# Patient Record
Sex: Male | Born: 1945
Health system: Southern US, Community
[De-identification: ages and names within clinical notes are randomized; demographics above are authoritative.]

## PROBLEM LIST (undated history)

## (undated) DIAGNOSIS — I6529 Occlusion and stenosis of unspecified carotid artery: Secondary | ICD-10-CM

## (undated) DIAGNOSIS — F17210 Nicotine dependence, cigarettes, uncomplicated: Secondary | ICD-10-CM

## (undated) DIAGNOSIS — E039 Hypothyroidism, unspecified: Secondary | ICD-10-CM

## (undated) DIAGNOSIS — Z9861 Coronary angioplasty status: Secondary | ICD-10-CM

## (undated) DIAGNOSIS — I739 Peripheral vascular disease, unspecified: Secondary | ICD-10-CM

## (undated) DIAGNOSIS — I2119 ST elevation (STEMI) myocardial infarction involving other coronary artery of inferior wall: Secondary | ICD-10-CM

## (undated) DIAGNOSIS — I2510 Atherosclerotic heart disease of native coronary artery without angina pectoris: Secondary | ICD-10-CM

## (undated) DIAGNOSIS — E785 Hyperlipidemia, unspecified: Secondary | ICD-10-CM

## (undated) DIAGNOSIS — R7989 Other specified abnormal findings of blood chemistry: Secondary | ICD-10-CM

## (undated) DIAGNOSIS — T82855A Stenosis of coronary artery stent, initial encounter: Secondary | ICD-10-CM

## (undated) DIAGNOSIS — I10 Essential (primary) hypertension: Secondary | ICD-10-CM

## (undated) HISTORY — DX: Hypothyroidism, unspecified: E03.9

## (undated) HISTORY — DX: Stenosis of coronary artery stent, initial encounter: T82.855A

## (undated) HISTORY — DX: Peripheral vascular disease, unspecified: I73.9

## (undated) HISTORY — PX: EYE SURGERY: SHX253

## (undated) HISTORY — PX: CHOLECYSTECTOMY: SHX55

## (undated) HISTORY — PX: CYST REMOVAL NECK: SHX6281

## (undated) HISTORY — DX: Atherosclerotic heart disease of native coronary artery without angina pectoris: Z98.61

## (undated) HISTORY — DX: ST elevation (STEMI) myocardial infarction involving other coronary artery of inferior wall: I21.19

## (undated) HISTORY — PX: TONSILLECTOMY: SUR1361

## (undated) HISTORY — DX: Essential (primary) hypertension: I10

## (undated) HISTORY — DX: Occlusion and stenosis of unspecified carotid artery: I65.29

## (undated) HISTORY — DX: Atherosclerotic heart disease of native coronary artery without angina pectoris: I25.10

## (undated) HISTORY — DX: Nicotine dependence, cigarettes, uncomplicated: F17.210

## (undated) HISTORY — PX: BACK SURGERY: SHX140

## (undated) HISTORY — DX: Other specified abnormal findings of blood chemistry: R79.89

## (undated) HISTORY — DX: Hyperlipidemia, unspecified: E78.5

---

## 1993-03-02 DIAGNOSIS — I2119 ST elevation (STEMI) myocardial infarction involving other coronary artery of inferior wall: Secondary | ICD-10-CM | POA: Insufficient documentation

## 1993-03-02 DIAGNOSIS — I219 Acute myocardial infarction, unspecified: Secondary | ICD-10-CM

## 1993-03-02 HISTORY — PX: CORONARY ANGIOPLASTY: SHX604

## 1993-03-02 HISTORY — DX: Acute myocardial infarction, unspecified: I21.9

## 1993-03-02 HISTORY — DX: ST elevation (STEMI) myocardial infarction involving other coronary artery of inferior wall: I21.19

## 2001-03-02 HISTORY — PX: CORONARY STENT PLACEMENT: SHX1402

## 2001-07-20 ENCOUNTER — Encounter: Admission: RE | Admit: 2001-07-20 | Discharge: 2001-07-20 | Payer: Self-pay | Admitting: Cardiology

## 2001-07-20 ENCOUNTER — Encounter: Payer: Self-pay | Admitting: Cardiology

## 2001-07-26 ENCOUNTER — Ambulatory Visit (HOSPITAL_COMMUNITY): Admission: RE | Admit: 2001-07-26 | Discharge: 2001-07-27 | Payer: Self-pay | Admitting: Cardiology

## 2001-07-27 ENCOUNTER — Encounter: Payer: Self-pay | Admitting: Cardiology

## 2001-10-07 ENCOUNTER — Ambulatory Visit (HOSPITAL_COMMUNITY): Admission: RE | Admit: 2001-10-07 | Discharge: 2001-10-08 | Payer: Self-pay | Admitting: Pediatrics

## 2003-02-20 ENCOUNTER — Encounter: Admission: RE | Admit: 2003-02-20 | Discharge: 2003-02-20 | Payer: Self-pay | Admitting: Cardiology

## 2008-12-03 ENCOUNTER — Encounter: Admission: RE | Admit: 2008-12-03 | Discharge: 2008-12-03 | Payer: Self-pay | Admitting: Neurosurgery

## 2010-03-02 DIAGNOSIS — T82855A Stenosis of coronary artery stent, initial encounter: Secondary | ICD-10-CM

## 2010-03-02 HISTORY — DX: Stenosis of coronary artery stent, initial encounter: T82.855A

## 2010-05-01 HISTORY — PX: CARDIAC CATHETERIZATION: SHX172

## 2010-05-14 ENCOUNTER — Emergency Department (HOSPITAL_COMMUNITY): Payer: 59

## 2010-05-14 ENCOUNTER — Inpatient Hospital Stay (HOSPITAL_COMMUNITY)
Admission: EM | Admit: 2010-05-14 | Discharge: 2010-05-15 | DRG: 287 | Disposition: A | Discharge status: Home / Self Care | Payer: 59 | Attending: Cardiovascular Disease | Admitting: Cardiovascular Disease

## 2010-05-14 DIAGNOSIS — E785 Hyperlipidemia, unspecified: Secondary | ICD-10-CM | POA: Diagnosis present

## 2010-05-14 DIAGNOSIS — I10 Essential (primary) hypertension: Secondary | ICD-10-CM | POA: Diagnosis present

## 2010-05-14 DIAGNOSIS — F17200 Nicotine dependence, unspecified, uncomplicated: Secondary | ICD-10-CM | POA: Diagnosis present

## 2010-05-14 DIAGNOSIS — Z7982 Long term (current) use of aspirin: Secondary | ICD-10-CM

## 2010-05-14 DIAGNOSIS — Z7902 Long term (current) use of antithrombotics/antiplatelets: Secondary | ICD-10-CM

## 2010-05-14 DIAGNOSIS — E039 Hypothyroidism, unspecified: Secondary | ICD-10-CM | POA: Diagnosis present

## 2010-05-14 DIAGNOSIS — R0789 Other chest pain: Principal | ICD-10-CM | POA: Diagnosis present

## 2010-05-14 LAB — COMPREHENSIVE METABOLIC PANEL
ALT: 28 U/L (ref 0–53)
AST: 26 U/L (ref 0–37)
Calcium: 9 mg/dL (ref 8.4–10.5)
GFR calc Af Amer: >60 mL/min (ref >60)
Potassium: 3.8 mEq/L (ref 3.5–5.1)
Sodium: 138 mEq/L (ref 135–145)
Total Protein: 6.6 g/dL (ref 6.0–8.3)

## 2010-05-14 LAB — CBC
HCT: 43.6 % (ref 39.0–52.0)
Hemoglobin: 15.3 g/dL (ref 13.0–17.0)
MCH: 33 pg (ref 26.0–34.0)
MCHC: 35.1 g/dL (ref 30.0–36.0)
RBC: 4.64 MIL/uL (ref 4.22–5.81)

## 2010-05-14 LAB — DIFFERENTIAL
Basophils Absolute: 0.1 K/uL (ref 0.0–0.1)
Basophils Relative: 1 % (ref 0–1)
Eosinophils Absolute: 0.2 K/uL (ref 0.0–0.7)
Eosinophils Relative: 3 % (ref 0–5)
Lymphocytes Relative: 27 % (ref 12–46)
Lymphs Abs: 2 K/uL (ref 0.7–4.0)
Monocytes Absolute: 0.6 K/uL (ref 0.1–1.0)
Monocytes Relative: 9 % (ref 3–12)
Neutro Abs: 4.3 K/uL (ref 1.7–7.7)
Neutrophils Relative %: 60 % (ref 43–77)

## 2010-05-14 LAB — POCT CARDIAC MARKERS
CKMB, poc: <1 ng/mL — ABNORMAL LOW (ref 1.0–8.0)
Myoglobin, poc: 55.8 ng/mL (ref 12–200)

## 2010-05-14 LAB — POCT I-STAT, CHEM 8
BUN: 14 mg/dL (ref 6–23)
Creatinine, Ser: 1.2 mg/dL (ref 0.4–1.5)
Glucose, Bld: 118 mg/dL — ABNORMAL HIGH (ref 70–99)
Sodium: 140 mEq/L (ref 135–145)
TCO2: 25 mmol/L (ref 0–100)

## 2010-05-14 LAB — PROTIME-INR
INR: 0.98 (ref 0.00–1.49)
Prothrombin Time: 13.2 seconds (ref 11.6–15.2)

## 2010-05-14 LAB — CK TOTAL AND CKMB (NOT AT ARMC)
CK, MB: 1.6 ng/mL (ref 0.3–4.0)
Relative Index: INVALID (ref 0.0–2.5)
Total CK: 84 U/L (ref 7–232)

## 2010-05-14 LAB — HEMOGLOBIN A1C: Hgb A1c MFr Bld: 5.9 % — ABNORMAL HIGH (ref <5.7)

## 2010-05-15 LAB — CARDIAC PANEL(CRET KIN+CKTOT+MB+TROPI)
CK, MB: 1.6 ng/mL (ref 0.3–4.0)
CK, MB: 1.4 ng/mL (ref 0.3–4.0)
Relative Index: INVALID (ref 0.0–2.5)
Total CK: 68 U/L (ref 7–232)

## 2010-05-15 LAB — CBC
HCT: 44.3 % (ref 39.0–52.0)
Hemoglobin: 15.3 g/dL (ref 13.0–17.0)
RBC: 4.68 MIL/uL (ref 4.22–5.81)
WBC: 5.5 10*3/uL (ref 4.0–10.5)

## 2010-05-15 LAB — LIPID PANEL
Cholesterol: 171 mg/dL (ref 0–200)
LDL Cholesterol: 90 mg/dL (ref 0–99)
VLDL: 49 mg/dL — ABNORMAL HIGH (ref 0–40)

## 2010-05-19 NOTE — Procedures (Signed)
  [name], [name] Dickerson                  ACCOUNT NO.:  [id number]  MEDICAL RECORD NO.:  [id number]           PATIENT TYPE:  I  LOCATION:  2035                         FACILITY:  [facility]  PHYSICIAN:  Thereasa Solo. Little, M.D. DATE OF BIRTH:  Sep 06, 1945  DATE OF PROCEDURE:  05/15/2010 DATE OF DISCHARGE:  05/15/2010                           CARDIAC CATHETERIZATION   INDICATIONS FOR TEST:  A 65 year old male was admitted with anginal-type pain.  He had had a RCA stent placed in 2003, had in-stent restenosis the same year, treated with a cutting balloon.  He has had very infrequent Cardiology followup since that time.  His EKG shows sinus bradycardia and cardiac markers x2 were negative.  After obtaining informed consent, the patient was prepped and draped in the usual sterile fashion, exposing the right groin.  Following local anesthetic with 1% Xylocaine, the Seldinger technique was employed and a 5-French introducer sheath was placed in the right femoral artery.  Left and right coronary arteriography and ventriculography in the RAO projection was performed.  COMPLICATIONS:  None.  EQUIPMENT:  Five-French Judkins configuration catheters.  TOTAL CONTRAST:  70 mL.  RESULTS:  Hemodynamic monitoring:  Central aortic pressure was 127/66, left ventricular pressure was 125/9.  Ventriculography:  Ventriculography in the RAO projection using 25 mL of contrast at 12 mL per second revealed the inferior wall to be akinetic. Ejection fraction 40%.  End-diastolic pressure was 17.  Coronary arteriography:  On fluoroscopy, there was calcification in the left main and proximal third of the LAD. 1. Left main.  There was terminal eccentric 40% narrowing of the left     main. 2. Circumflex.  The circumflex gave rise to one OM vessel, it was free     of disease. 3. Ramus widely patent, medium-sized vessel, free of disease. 4. LAD.  The proximal portion of the LAD was normal.  The distal 2/3rd     was  relatively small and there was a small diagonal branch. 5. Right coronary artery.  There was a stent in the midportion of the     RCA that was chronically occluded.  There was some slow filling of     the distal right via skipped collaterals around the stent and there     was an exceptionally large collateral bed from both the circumflex     and LAD, filling the RCA system.  The plan for now is medical therapy.  He will need a baseline nuclear study to follow the left main stenosis.  It is not critical enough at this point to warrant surgery.          ______________________________ Thereasa Solo Little, M.D.     ABL/MEDQ  D:  05/15/2010  T:  05/16/2010  Job:  [id number]  cc:   Burnell Blanks, MD Cath Lab  Electronically Signed by Julieanne Manson M.D. on 05/19/2010 04:26:04 PM

## 2010-06-01 NOTE — Discharge Summary (Signed)
[name], [name]                  ACCOUNT NO.:  [id number]  MEDICAL RECORD NO.:  [id number]           PATIENT TYPE:  I  LOCATION:  2035                         FACILITY:  [facility]  PHYSICIAN:  Thereasa Solo. Little, M.D. DATE OF BIRTH:  05/16/1945  DATE OF ADMISSION:  05/14/2010 DATE OF DISCHARGE:  05/15/2010                              DISCHARGE SUMMARY   DISCHARGE DIAGNOSES: 1. Chest pain, negative cardiac enzymes, negative EKG changes, status     post cardiac catheterization on May 15, 2010, showed chronic     occlusion of the right coronary artery with large left-to-right     collaterals.  Left main showed terminal 40% lesion. 2. Hypertension. 3. Dyslipidemia. 4. Tobacco abuse. 5. Hypothyroidism.  HOSPITAL COURSE:  [name] Dickerson is a 65 year old Caucasian male with history of coronary artery disease with a nondrug-eluting stent to the RCA in May 2003 with in-stent restenosis in August 2003, treated with cutting balloon.  History also includes hypertension, hyperlipidemia for which he is STATIN intolerant, erectile dysfunction, tobacco abuse, hypothyroidism.  He had a Myoview on September 06, 2009, which showed a marked perfusion defect in the basal mid and apical inferior as well as basal inferolateral and mid inferolateral region.  There was negative inducible ischemia with ejection fraction at that was 46%.  [name] Dickerson developed a burning sensation in his right thoracic spine which radiated up to his cervical spine as well as chest tightness.  He reported this as similar to his episode in 2003, at which time he had a myocardial infarction, however, this episode was less intense.  He was admitted to rule out acute coronary syndrome.  He was started on IV nitroglycerin, IV heparin, aspirin, Plavix, metoprolol 50 mg b.i.d., Synthroid at home dose.  TSH A1c and EKG were checked in the a.m.  He was discussed for left heart catheterization on May 15, 2010.  On the morning of May 15, 2010,  [name] Dickerson was pain free.  Cardiac enzymes to date have been negative x3.  Left heart catheterization showed a terminal 40% lesion in the left main.  There was total occlusion of the right coronary artery which appeared chronic with large left-to-right collaterals.  At this time, Dr. Darrick Penna feels [name] Dickerson is stable for discharge with follow up in the office and outpatient Myoview.  Also noted that the patient had tobacco cessation counseling and has been given a prescription for Nicotrol inhaler to facilitate cessation of smoking.  DISCHARGE LABORATORY DATA:  WBCs 5.5, hemoglobin 15.3, hematocrit 44.3, platelets 139,000.  Sodium 138, potassium 3.8, chloride 106, carbon dioxide 26, glucose 123, BUN 13, creatinine 1.02.  T-Bili 0.5, alkaline phosphatase 55, AST 26, ALT 28.  Total protein 6.6, albumin 4.0, calcium 9.0.  A1c 5.9, mean plasma glucose 123.  Cardiac enzymes negative x3. Cholesterol 171, triglycerides 245, HDL 32, LDL 90, VLDL was 49, and total cholesterol HDL ratio was 5.3.  TSH was 2.148.  STUDIES/PROCEDURES: 1. Chest x-ray on May 14, 2010, shows no active disease in one view.     Heart and mediastinal contours were normal.  Lungs were clear.  ICD     shunt was intact in one view. 2. Cardiac catheterization on May 15, 2010, left heart cath via the     right femoral artery with inferior akinesis with an ejection     fraction of 40%.  RCA showed occlusion which is chronic with large     left-to-right collaterals.  DISCHARGE MEDICATIONS: 1. Acetaminophen 325 mg 2 tablets by mouth every 4 hours as needed for     pain. 2. Nicotrol inhaler 10 mg per cartridge 1-6 inhalers inhaled daily. 3. Nitroglycerin sublingual 0.4 mg tablets, 1 tablet under the tongue     every 5 minutes up to 3 tablets total for chest pain. 4. Aspirin 81 mg 1 tablet by mouth daily. 5. Lisinopril 30 mg 1 tablet by mouth daily. 6. Lovaza 1 g capsules, 4 capsules by mouth daily. 7. Metoprolol tartrate 50  mg 1 tablet by mouth twice daily. 8. Niacin 500 mg 1 tablet by mouth twice daily. 9. Plavix 75 mg 1 tablet by mouth daily. 10.Synthroid 112 mcg 2 tablets by mouth every other day. 11.Synthroid 125 mcg 2 tablets on the days that was not taking the 112     mcg tablets. 12.Cinnamon herbal over-the-counter 1 capsule daily. 13.Coenzyme Q10 over-the-counter 1 tablet by mouth daily.  DISPOSITION:  [name] Dickerson will be discharged home in stable condition.  He is to increase his activity slowly.  He may shower or bathe.  He is recommended he eat a low-sodium, heart-healthy diet as well as increase his exercise level.  If catheter site becomes red, painful, swollen, or discharges fluid or pus, he is to call our office.  He will follow up on May 20, 2010, at 7 o'clock in the morning for a Persantine Myoview stress test as well as followup with Dr. Clarene Duke on May 26, 2010, at 11:20 a.m. for results of the stress test and also has been emphasized that [name] Dickerson needs to stop smoking and he has been given Nicotrol inhalers prescriptions in order to help facilitate that.    ______________________________ Wilburt Finlay, PA   ______________________________ Thereasa Solo. Little, M.D.    Jane Canary  D:  05/15/2010  T:  05/16/2010  Job:  [id number]  cc:   Burnell Blanks, MD  Electronically Signed by Wilburt Finlay PA on 05/29/2010 02:33:18 PM Electronically Signed by Julieanne Manson M.D. on 06/01/2010 10:27:03 AM

## 2010-07-18 NOTE — Cardiovascular Report (Signed)
[place]. [facility]  Patient:    [name] Dickerson, [name] Dickerson Visit Number: [id number] MRN: [id number]          Service Type: CAT Location: 6500 [id number] Attending Physician:  Loreli Dollar Dictated by:   Delrae Rend, M.D. Proc. Date: 07/26/01 Admit Date:  07/26/2001                          Cardiac Catheterization  NO DICTATION Dictated by:   Delrae Rend, M.D. Attending Physician:  Loreli Dollar DD:  07/26/01 TD:  07/27/01 Job: [id number]

## 2010-07-18 NOTE — Cardiovascular Report (Signed)
NAME:  [name] Dickerson, [name] Dickerson                            ACCOUNT NO.:  [id number]   MEDICAL RECORD NO.:  [id number]                   PATIENT TYPE:  OIB   LOCATION:  6531                                 FACILITY:  [facility]   PHYSICIAN:  Thereasa Solo. Little, M.D.              DATE OF BIRTH:  [birth year]   DATE OF PROCEDURE:  10/07/2001  DATE OF DISCHARGE:  10/08/2001                              CARDIAC CATHETERIZATION   INDICATIONS FOR PROCEDURE:  The patient is a 65 year old male who had stents  placed to his RCA, May 2003.  He presented with atypical rest pain  yesterday, but the pain was in the same distribution and similar to what he  had previously. Because of this, he is brought in for outpatient cardiac  catheterization.   DESCRIPTION OF PROCEDURE:  The patient was prepped and draped in the usual  sterile fashion exposing the right groin. Following local anesthetic with 1%  Xylocaine, a 5 French introducer sheath was placed into the right femoral  artery. Left and right coronary arteriography was performed.  Ventriculography was not performed.   EQUIPMENT:  The 5 French Judkins configuration catheters.   RESULTS:  Central aortic pressure 142/95.   CORONARY ARTERIOGRAPHY:  Calcification in the LAD, left main.  1. Left main:  Normal.  2. LAD:  The LAD had mild calcification in the proximal portion with 30%     area of narrowing proximally. Just distal to the first diagonal is a 50%     area of narrowing.  The first diagonal was not involved. The distal     vessel was free of disease as was the diagonal.  3. Circumflex:  The circumflex gave rise to a large obtuse marginal vessel     with proximal 30% area of narrowing.  4. Right coronary artery:  On fluoroscopy you could see the stents that were     in the proximal and midportion of the LAD. They clearly overlapped. Just     proximal to the stent was a 30% area of narrowing. In the stent were two     areas, on 50%, one 70% area of  narrowing. The distal vessel and the PDA     and PLs were free of disease.   Because of the stenosis in the stent, arrangements were made for  intervention.   A 6 French sheath was exchanged from the previously placed 5 [place] sheath.  A JR4 guide catheter, short luge wire, and a 4.0 x 15 Cutting Balloon was  used. The balloon was positioned within the stent and a total of seven  inflations were performed all within the stent. With each inflation, the  patient had chest pain similar to what he had at home, and ST segment  elevation on the monitors. This resolved with deflating the balloon.  The  areas that were 50 and 70 area preintervention were basically normal,  postintervention with brisk distal flow.  No dissection or thrombus. The  proximal area in the RCA was not addressed because it was not within the  stent and the lumen was greater than 3.0 mm.   Angiomax was used during the procedure. The patient was given 300 mg of  Plavix and should be ready for discharge in the morning.                                                Thereasa Solo. Little, M.D.    ABL/MEDQ  D:  10/07/2001  T:  10/11/2001  Job:  [id number]   cc:   Kizzie Furnish, M.D.

## 2010-07-18 NOTE — Cardiovascular Report (Signed)
[place]. [facility]  Patient:    [name] Dickerson, [name] Dickerson Visit Number: [id number] MRN: [id number]          Service Type: CAT Location: 6500 [id number] Attending Physician:  Loreli Dollar Dictated by:   Julieanne Manson, M.D. Proc. Date: 07/26/01 Admit Date:  07/26/2001 Discharge Date: 07/27/2001   CC:         Kizzie Furnish, M.D.  Catheterization Lab   Cardiac Catheterization  PROCEDURES: 1. Left heart catheterization. 2. Selective right and left coronary arteriography.  CARDIOLOGIST:  Julieanne Manson, M.D.  INDICATIONS FOR PROCEDURE:  Mr. Selley is a 65 year old male who in 1995 had an inferior myocardial infarction. He was treated with [facility] and angioplasty at that time.  He presents now with a two month history of progressive exertional angina worse in the last three weeks.  He is brought in for outpatient catheterization for unstable angina.  DESCRIPTION OF PROCEDURE:  The patient was prepared and draped in the usual sterile fashion exposing the right groin.  Following local anesthetic with 1% Xylocaine the Seldinger technique was employed, and a 5 [place] introducer sheath was placed in the right femoral artery.  Selective right and left coronary arteriography and ventriculography in the RAO projection was performed. The 5 French Judkins configuration catheters were used.  Following this, percutaneous intervention to the right coronary artery was undertaken.  COMPLICATIONS:  None.  RESULTS: 1. Hemodynamic monitoring:  Central aortic pressure 127/79, left    ventricular pressure 128/16, left ventricular end-diastolic pressure    was 23. 2. Ventriculography:  Ventriculography in the RAO projection using    25 cc of contrast at 12 cc per second revealed good opacification of the    left ventricular.  The posterior basilar segment, inferior and apical    segments were markedly hypokinetic.  The anterior wall showed normal    wall motion.  The ejection  fraction calculated at 43%.  No mitral    regurgitation was seen. End-diastolic pressure 23.  CORONARY ARTERIOGRAPHY:  There was calcification noted in the left main and proximal left anterior descending distribution on fluoroscopy. 1. Left main:  Normal. 2. Left anterior descending:  The LAD extended down and barely around the    apex of the heart.  It gave rise to one large first diagonal branch.    There was proximal irregularity of around 30% in the mid portion just after    the first diagonal branch and another area of 30% irregularity.  The    diagonal itself in the distal vessel is free of significant disease. 3. Optional diagonal:  Moderate size and normal. 4. Circumflex:  The circumflex gave rise to only one OM vessel.  This    system had 40% mild irregularities in the mid portion. 5. Right coronary artery:  The right coronary artery was a dominant vessel    and large in diameter.  The proximal and mid segments were severely    diseased.  The distal vessel had mild irregularities as did the PDA.    In the mid portion of the right coronary artery was a long area of 95%    narrowing.  The proximal portion of the vessel had two sequential lesions    of 70% narrowing.  There was some minimal narrowing of the ostium.  CONCLUSION: 1. Severe single vessel disease with high grade stenosis in the mid and    proximal right coronary artery. 2. Moderate left ventricular function dysfunction, 43% ejection fraction  inferior posterior basilar segment wall abnormalities. 3. Because of the above mentioned right coronary anatomy arrangements were    made for intervention.  A 7 French introducer sheath was exchanged for the    previously placed 5 [place] sheath.  Attempts at cannulating the right    coronary artery with a JR4 side hole 7 [place] was unsuccessful.  A    6 [place] guide catheter cannulated it nicely, and there was excellent    reflux.  I do not think that the 7 French catheter  damped or would not    engage because of ostial narrowing.  PROCEDURE:  A 182 cm luge wire was then passed into the PDA.  Initially, a CrossSail balloon 2.5 x 20 was placed in the area of the obstruction of the midportion. A single inflation of 10 atmospheres for 59 seconds resulted in marked improvement but still 60% narrowing.  There was no evidence of any focal dissection.  A 4-0 x 23 Zeta stent was made ready.  It was positioned distal to the mid lesion and extended back as far proximally as I could take it.  It was initially deployed at 13 atmospheres for 64 seconds with the final inflation being 14 atmospheres for 63 seconds.  This area that had been 95% previously now appeared to be normal after stenting.  There was brisk TIMI-3 flow pre and post intervention.  The proximal portion was then addressed.  A 4.0 x 15 Zeta stent was made ready and placed in such a manner that it overlapped the previously placed stent and extended proximally past both areas of narrowing.  It was deployed at 14 x 64 and 14 x 62.  This area appeared to be normal after intervention.  This same balloon was then placed so that it was well into the area of the overlap and a single inflation of 16 atmospheres for 60 seconds was performed.  The 70% areas of sequential narrowing now appeared to be normal.  The patient was given double bolus Integrelin, and this will be continued for 12 hours, 300 mg of oral Plavix, 5000 units of intravenous Hespan and he should be ready for discharge tomorrow.  Despite having a relatively good lipid panel he would need lipid lowering agents.    CONCLUSIONS: 1.  DISCUSSION: Dictated by:   Julieanne Manson, M.D. Attending Physician:  Loreli Dollar DD:  07/26/01 TD:  07/27/01 Job: [id number]

## 2010-07-18 NOTE — Consult Note (Signed)
[place]. [name]  Patient:    [name] Dickerson, [name] Dickerson Visit Number: [id number] MRN: [id number]          Service Type: CAT Location: 6500 [id number] 01 Attending Physician:  Loreli Dollar Dictated by:   Marlan Palau, M.D. Proc. Date: 07/27/01 Admit Date:  07/26/2001 Discharge Date: 07/27/2001   CC:         Julieanne Manson, M.D.  [facility]; [address]   Consultation Report  HISTORY OF PRESENT ILLNESS:  [name] Dickerson. Gosney is a 65 year old left-handed white male born 1946/01/12 with a history of coronary artery disease, hypertension.  This patient had evidence of a 95% stenosis of the mid portion of the right coronary artery and had a stent placed.  This patient has done well post procedure, but as he was "waking up" following the procedure noted double vision in the vertical plane with some horizontal separation as well. It seemed to be persistent and consistent no matter which direction he looked. Patient would note absence of double vision when he covered one eye.  This double vision has persisted until today, but is much improved.  Patient notes no other associated symptoms such as headache, nausea, vomiting, vertigo, numbness or weakness on the face, arms, or legs, gait instability, confusion, loss of vision.  This patient apparently had some possible slight left-sided ptosis following the coronary catheterization.  A carotid Doppler study is unremarkable.  Neurology has been asked to see this patient for further evaluation.  PAST MEDICAL HISTORY: 1. New onset of skewed deviation as above. 2. History of hypothyroidism. 3. History of coronary artery disease. 4. Hypertension. 5. Mild obesity.  MEDICATIONS PRIOR TO ADMISSION: 1. Synthroid 0.1 mg daily. 2. Toprol XL 50 mg q.d. 3. Enteric-coated aspirin 325 mg q.d. 4. Niacin 500 mg two b.i.d. for high cholesterol. 5. Patient will be discharged on Plavix.  SOCIAL  HISTORY:  Patient had been smoking two packs of cigarettes per day prior to this admission.  Drinks alcohol regularly, at least one or two drinks a night.  Patient is married, [place]  Has three children who are alive and well.  ALLERGIES:  Patient states no known allergies.  FAMILY HISTORY:  Both parents are still alive.  Mother has a history of cancer in the past.  Father is alive and well.  Patient has no brothers or sisters, is an only child.  No family history of diabetes is noted, although patient was told he had some elevation of blood sugar in the past that has been diet controlled.  REVIEW OF SYSTEMS:  Notable for no fevers, chills.  Patient denies shortness of breath, chest pains, nausea, vomiting, bowel or bladder disturbance, blackout episodes, vertigo, gait disturbance.  PHYSICAL EXAMINATION  VITAL SIGNS:  Blood pressure 142/86, heart rate 60, respiratory rate 20, temperature afebrile.  GENERAL:  This patient is a minimally to moderately obese white male who is alert and cooperative at the time of examination.  HEENT:  Head is atraumatic.  Eyes:  Pupils are equal, round, and reactive to light.  Disks are flat bilaterally.  No ptosis is seen.  NECK:  Supple.  No carotid bruits noted.  RESPIRATORY:  Clear.  CARDIOVASCULAR:  Regular rate and rhythm with no obvious murmurs or rubs noted.  EXTREMITIES:  Without significant edema.  NEUROLOGIC:  Cranial nerves as above.  Facial symmetry is present.  Patient notes good sensation to face to pin prick and  soft touch bilaterally.  Patient has good strength to fascial muscles and the muscles to head turn and shoulder shrug bilaterally.  Speech is well enunciated, not aphasic.  Some end gaze nystagmus is seen bilaterally.  With cover test there does appear to be some vertical misalignment.  The left eye is down and the right eye is up compared to the other eye.  Extraocular movements otherwise  appear to be fairly full. Patient has good strength in all four extremities.  Good symmetric motor tone is noted throughout.  Sensory testing is intact to pin prick, soft touch, vibratory sensation throughout.  Deep tendon reflexes are symmetric.  Toes are neutral bilaterally.  Patient was able to ambulate well without assistance. Has good tandem gait.  Romberg negative.  No pronator drift is seen.  Patient has good finger-nose-finger, heel-to-shin.  LABORATORY VALUES:  Notable for CPK 85.  White count 9.8, hemoglobin 15.3, hematocrit 43.3, platelets 177,000.  White count 6.5, hemoglobin 15.4, hematocrit 45.1, MCV 95.1, platelets 164,000.  INR 0.9.  Patient has high triglyceride level 406, cholesterol level 152.  IMPRESSION: 1. History of coronary artery disease status post coronary catheterization    stenting procedure. 2. 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 3. Thyroid disease. 4. Hypertension.  This patient likely sustained a small stroke event possibly from cholesterol emboli from the aorta during the coronary catheterization procedure.  Patient is improving significantly at this point.  Will pursue brief work-up at this point.  Patient cannot have an MRI scan of the brain due to recent coronary artery stenting procedure.  PLAN: 1. CT of the head.  If this is unremarkable patient may be discharged on    antiplatelet agents such as aspirin and Plavix. 2. Check blood work for TSH and acetylcholine receptor antibody level. 3. Will pursue an MRI of the brain and MR angiogram of the posterior    circulation at some point following discharge.  Need to wait at least three    weeks or so before this is done.  Patient is to contact our office    immediately if any further questions or problems arise. Dictated by:   Marlan Palau, M.D. Attending Physician:  Loreli Dollar DD:  07/27/01 TD:  07/28/01  Job:  [id number]

## 2011-04-08 DIAGNOSIS — I2510 Atherosclerotic heart disease of native coronary artery without angina pectoris: Secondary | ICD-10-CM | POA: Insufficient documentation

## 2011-04-08 HISTORY — DX: Atherosclerotic heart disease of native coronary artery without angina pectoris: I25.10

## 2011-10-01 HISTORY — PX: OTHER SURGICAL HISTORY: SHX169

## 2012-11-14 ENCOUNTER — Telehealth (HOSPITAL_COMMUNITY): Payer: Self-pay | Admitting: Cardiology

## 2012-11-16 ENCOUNTER — Other Ambulatory Visit (HOSPITAL_COMMUNITY): Payer: Self-pay | Admitting: Cardiology

## 2012-11-16 DIAGNOSIS — R0602 Shortness of breath: Secondary | ICD-10-CM

## 2013-01-02 ENCOUNTER — Ambulatory Visit (HOSPITAL_COMMUNITY): Payer: 59

## 2013-01-09 ENCOUNTER — Ambulatory Visit (HOSPITAL_COMMUNITY)
Admission: RE | Admit: 2013-01-09 | Discharge: 2013-01-09 | Disposition: A | Discharge status: Home / Self Care | Payer: Medicare Other | Source: Ambulatory Visit | Attending: Cardiovascular Disease | Admitting: Cardiovascular Disease

## 2013-01-09 DIAGNOSIS — R0602 Shortness of breath: Secondary | ICD-10-CM | POA: Insufficient documentation

## 2013-01-09 HISTORY — PX: TRANSTHORACIC ECHOCARDIOGRAM: SHX275

## 2013-01-09 NOTE — Progress Notes (Signed)
2D Echo Performed 01/09/2013    Aysen Shieh, RCS  

## 2013-01-10 ENCOUNTER — Telehealth: Payer: Self-pay | Admitting: *Deleted

## 2013-01-10 NOTE — Telephone Encounter (Signed)
Message copied by Tobin Chad on Tue Jan 10, 2013  5:55 PM ------      Message from: Herbie Baltimore, DAVID W      Created: Tue Jan 10, 2013  2:14 PM       EF almost back to normal with decreased motion in the region of the known blocked artery.      Otherwise no real abnormalities.            Marykay Lex, MD       ------

## 2013-01-10 NOTE — Telephone Encounter (Signed)
Left message to call back concerning echo

## 2013-01-11 NOTE — Telephone Encounter (Signed)
Spoke to patient. Result given . Verbalized understanding  

## 2013-01-11 NOTE — Telephone Encounter (Signed)
Returning your call. °

## 2013-03-10 ENCOUNTER — Ambulatory Visit (INDEPENDENT_AMBULATORY_CARE_PROVIDER_SITE_OTHER): Payer: Medicare Other | Admitting: Cardiology

## 2013-03-10 ENCOUNTER — Encounter: Payer: Self-pay | Admitting: Cardiology

## 2013-03-10 VITALS — BP 130/80 | HR 70 | Ht 69.0 in | Wt 204.1 lb

## 2013-03-10 DIAGNOSIS — R7989 Other specified abnormal findings of blood chemistry: Secondary | ICD-10-CM

## 2013-03-10 DIAGNOSIS — E785 Hyperlipidemia, unspecified: Secondary | ICD-10-CM

## 2013-03-10 DIAGNOSIS — F17210 Nicotine dependence, cigarettes, uncomplicated: Secondary | ICD-10-CM

## 2013-03-10 DIAGNOSIS — Z9861 Coronary angioplasty status: Secondary | ICD-10-CM

## 2013-03-10 DIAGNOSIS — E669 Obesity, unspecified: Secondary | ICD-10-CM

## 2013-03-10 DIAGNOSIS — F17200 Nicotine dependence, unspecified, uncomplicated: Secondary | ICD-10-CM

## 2013-03-10 DIAGNOSIS — I2119 ST elevation (STEMI) myocardial infarction involving other coronary artery of inferior wall: Secondary | ICD-10-CM

## 2013-03-10 DIAGNOSIS — E291 Testicular hypofunction: Secondary | ICD-10-CM

## 2013-03-10 DIAGNOSIS — I2510 Atherosclerotic heart disease of native coronary artery without angina pectoris: Secondary | ICD-10-CM

## 2013-03-10 DIAGNOSIS — I10 Essential (primary) hypertension: Secondary | ICD-10-CM

## 2013-03-10 NOTE — Patient Instructions (Signed)
Your physician discussed the importance of regular exercise and recommended that you start or continue a regular exercise program for good health.  Echo look better.   Your physician wants you to follow-up in 12 months Dr Ellyn Hack.  You will receive a reminder letter in the mail two months in advance. If you don't receive a letter, please call our office to schedule the follow-up appointment.

## 2013-03-10 NOTE — Progress Notes (Addendum)
PATIENT: [name] Dickerson MRN: [id number]  DOB: 1945-04-29   DOV:03/12/2013 PCP: Suzan Garibaldi, FNP  Clinic Note: Chief Complaint  Patient presents with  . Annual Exam    no complaints   HPI: [name] Dickerson is a 68 y.o.  male with a PMH below who presents today for annual followup. I last saw him in January 2014 he is doing well. He is a history of an inferior STEMI in 1995 followed by recurrent PCI to the RCA in 2003 is relatively stable since within Myoview was negative in August 2013. He is currently establishing a new primary provider named Suzan Garibaldi, [place] in [place], [place]. He had an echocardiogram done back in November for followup.  Interval History: He presents today doing relatively well and no major issues to complain of. His major thing he notes is just decreased energy and feeling tired overall. He is having mild episodic kidney stones and giving him some problems. He thinks his diet mostly because of the work occasionally has been quite difficult. He also has gained some weight but is not a more short of breath with exertion. He denies any chest tightness or pressure with exertion. No dyspnea position. No PND, orthopnea or edema. No lightheadedness, dizziness, wooziness or syncope/near syncope. No TIA or amaurosis fugax symptoms. No claudication symptoms. No melena, hematochezia or hematuria.  Past Medical History  Diagnosis Date  . ST elevation myocardial infarction (STEMI) of inferior wall, subsequent episode of care 1995    PTCA of RCA  . CAD S/P percutaneous coronary angioplasty 1995, 2003    Inferior STEMI-PTCA RCA; 2003: overlapping MultiLink Zeta BMS -RCA (4.0 mm x 23 mm, 4.0 mm x 15 mm); August 2003 Cutting Balloon PTCA of stents for ISR.;; Negative Myoview August 2013  . Hypertension   . Hyperlipidemia LDL goal <70     Statin intolerance. Monitored by PCP.  Marland Kitchen Moderate cigarette smoker (10-19 per day)     Was down to 5 cigarettes a day.  . Hypothyroidism   . Low testosterone      Prior Cardiac Evaluation and Past Surgical History: Past Surgical History  Procedure Laterality Date  . Coronary angioplasty  1995    Inferior STEMI-PTCA of RCA  . Coronary stent placement  2003    MultiLink Zeta BMS 4.0 mm x 23 mm, 4.0 mm x 15 mm --> Cutting Balloon PTCA for ISR several months later.  . Cardiac catheterization  March 2012    Mid occlusion of RCA with Right to Left and Left to Right collaterals; LAD, ramus and circumflex/OM widely patent  . Leane Call  August 2013    Moderate-severe sized INFERIOR perfusion defect with no evidence of ischemia. EF 55% with distal inferior HK. Large sized Inferior and Inferolateral Infarct with perhaps mild apical inferior ischemia.  . Transthoracic echocardiogram  01/09/2013    EF 50-55%; normal LV size and function. Probable mild HJ of the mid inferior and inferoseptal wall.; No valvular lesions.    Allergies  Allergen Reactions  . Statins     Soreness with Crestor    Current Outpatient Prescriptions  Medication Sig Dispense Refill  . aspirin EC 81 MG tablet Take 81 mg by mouth daily.      . Cinnamon 500 MG TABS Take 2,000 mg by mouth 2 (two) times daily.      . clopidogrel (PLAVIX) 75 MG tablet Take 75 mg by mouth daily with breakfast.      . lisinopril (PRINIVIL,ZESTRIL) 30 MG tablet  Take 30 mg by mouth daily.      . metoprolol (LOPRESSOR) 50 MG tablet Take 50 mg by mouth 2 (two) times daily.      . Niacin (NICOTINIC ACID ER PO) Take 500 mg by mouth daily.      . nitroGLYCERIN (NITROSTAT) 0.4 MG SL tablet Place 0.4 mg under the tongue every 5 (five) minutes as needed for chest pain.      Marland Kitchen testosterone cypionate (DEPO-TESTOSTERONE) 200 MG/ML injection Inject 200 mg into the muscle every 14 (fourteen) days.       No current facility-administered medications for this visit.    History   Social History Narrative   Married father of 3, grandfather 41. Works out occasionally on the treadmill. Coping to try get back into  some exercise now. Unfortunately back up to smoking one pack per day. Does not drink alcohol.      Date from from 35 or 6 cigarettes a day. He's tried electronic cigarettes and down without affect.   ROS: A comprehensive Review of Systems - Negative except Mild exertional dyspnea and fatigue as noted above. General ROS: positive for  - fatigue and That he thinks is likely due to a low testosterone levels. kidney stones  PHYSICAL EXAM BP 130/80  Pulse 70  Ht 5\' 9"  (1.753 m)  Wt 204 lb 1.6 oz (92.579 kg)  BMI 30.13 kg/m2 General appearance: alert& oriented x3, cooperative, appears stated age, no distress and mildly obese; well groomed/well-nourished Neck: no adenopathy, no carotid bruit, no JVD and supple, symmetrical, trachea midline Lungs: clear to auscultation bilaterally, normal percussion bilaterally and Nonlabored, good air movement Heart: regular rate and rhythm, S1, S2 normal, no murmur, click, rub or gallop and normal apical impulse Abdomen: soft, non-tender; bowel sounds normal; no masses,  no organomegaly and truncal obesity Extremities: extremities normal, atraumatic, no cyanosis or edema, no edema, redness or tenderness in the calves or thighs and no ulcers, gangrene or trophic changes Pulses: 2+ and symmetric Neurologic: Grossly normal  [id number] today: Yes Rate: 70 , Rhythm: NSR, nonspecific-ST-T abnormalities  Recent Labs:  None recently. Currently were checked by PCP 3 months ago.  ASSESSMENT / PLAN: CAD S/P percutaneous coronary angioplasty Relatively stable with no active anginal symptoms. He is RCA is noted the occluded in the left system had no significant disease. Mostly for her preventing this measures he is on aspirin plus Plavix. He is also on ACE inhibitor and beta blocker. Unfortunately he is statin intolerant and therefore none statin, but he is on niacin.  ST elevation myocardial infarction (STEMI) of inferior wall, subsequent episode of care No symptoms  to suggest inferior STEMI. His Myoview did show evidence of inferior scar with minimal ischemia.  Hypertension Well-controlled on beta blocker and ACE inhibitor on stable doses.  Hyperlipidemia LDL goal <70 Unfortunately statin intolerant. He is only on niacin. His labs will be rechecked by his PCP. He says they were last checked about 2-3 months ago, does not  Obesity (BMI 30-39.9) We talked about importance of him getting back into exercise. Of course this is somewhat bothered by the fact he does work long hours. His "very active at work but is not able to get into exercise. His also has chronic fatigue from low testosterone it makes him not want to do much exercise after work. I had a discussion with he and his wife about dietary modifications as well.  Moderate cigarette smoker (10-19 per day) Unfortunately he is back to smoking now.  I did spend about 34 minutes talking about the importance of smoking cessation. He's tried using i cigarettes, but not sure if he gave enough a chance. Also consider using the Nicoderm patches. He did not show dramatic interest in quitting, Micronase will be to I recommend patient will be to contact [place] [facility]uit[facility] for recommendations and assistance.  Low testosterone He still using need testosterone injections. We were asked and the concern for cancer in his*. My take on this is that he needs to have another baseline testosterone level to function at work. I would not target levels near therapeutic.  Instead I would simply sheet for subtherapeutic levels that would help with energy improvement.    Orders Placed This Encounter  Procedures  . EKG 12-Lead     Followup:  One year  Davaris Youtsey W. Ellyn Hack, M.D., M.S. [facility] [address] [place], [place]  [address]  [phone] Pager # [phone]

## 2013-03-12 ENCOUNTER — Encounter: Payer: Self-pay | Admitting: Cardiology

## 2013-03-12 DIAGNOSIS — F17210 Nicotine dependence, cigarettes, uncomplicated: Secondary | ICD-10-CM | POA: Insufficient documentation

## 2013-03-12 DIAGNOSIS — E785 Hyperlipidemia, unspecified: Secondary | ICD-10-CM | POA: Insufficient documentation

## 2013-03-12 DIAGNOSIS — E669 Obesity, unspecified: Secondary | ICD-10-CM

## 2013-03-12 DIAGNOSIS — I10 Essential (primary) hypertension: Secondary | ICD-10-CM | POA: Insufficient documentation

## 2013-03-12 DIAGNOSIS — I2119 ST elevation (STEMI) myocardial infarction involving other coronary artery of inferior wall: Secondary | ICD-10-CM | POA: Insufficient documentation

## 2013-03-12 DIAGNOSIS — R7989 Other specified abnormal findings of blood chemistry: Secondary | ICD-10-CM | POA: Insufficient documentation

## 2013-03-12 HISTORY — DX: Obesity, unspecified: E66.9

## 2013-03-12 NOTE — Assessment & Plan Note (Signed)
No symptoms to suggest inferior STEMI. His Myoview did show evidence of inferior scar with minimal ischemia.

## 2013-03-12 NOTE — Assessment & Plan Note (Signed)
Well-controlled on beta blocker and ACE inhibitor on stable doses.

## 2013-03-12 NOTE — Assessment & Plan Note (Signed)
Unfortunately statin intolerant. He is only on niacin. His labs will be rechecked by his PCP. He says they were last checked about 2-3 months ago, does not

## 2013-03-12 NOTE — Assessment & Plan Note (Addendum)
We talked about importance of him getting back into exercise. Of course this is somewhat bothered by the fact he does work long hours. His "very active at work but is not able to get into exercise. His also has chronic fatigue from low testosterone it makes him not want to do much exercise after work. I had a discussion with he and his wife about dietary modifications as well.

## 2013-03-12 NOTE — Assessment & Plan Note (Signed)
He still using need testosterone injections. We were asked and the concern for cancer in his*. My take on this is that he needs to have another baseline testosterone level to function at work. I would not target levels near therapeutic.  Instead I would simply sheet for subtherapeutic levels that would help with energy improvement.

## 2013-03-12 NOTE — Assessment & Plan Note (Signed)
Unfortunately he is back to smoking now. I did spend about 34 minutes talking about the importance of smoking cessation. He's tried using i cigarettes, but not sure if he gave enough a chance. Also consider using the Nicoderm patches. He did not show dramatic interest in quitting, Micronase will be to I recommend patient will be to contact [place] [facility]uit-[facility] for recommendations and assistance.

## 2013-03-12 NOTE — Assessment & Plan Note (Signed)
Relatively stable with no active anginal symptoms. He is RCA is noted the occluded in the left system had no significant disease. Mostly for her preventing this measures he is on aspirin plus Plavix. He is also on ACE inhibitor and beta blocker. Unfortunately he is statin intolerant and therefore none statin, but he is on niacin.

## 2013-03-13 ENCOUNTER — Encounter: Payer: Self-pay | Admitting: Cardiology

## 2013-05-15 ENCOUNTER — Other Ambulatory Visit: Payer: Self-pay | Admitting: *Deleted

## 2013-05-15 MED ORDER — NITROGLYCERIN 0.4 MG SL SUBL: 0.4000 mg | SUBLINGUAL_TABLET | SUBLINGUAL | Status: DC | PRN

## 2013-05-15 NOTE — Telephone Encounter (Signed)
Rx was sent to pharmacy electronically. 

## 2014-03-19 DIAGNOSIS — M5417 Radiculopathy, lumbosacral region: Secondary | ICD-10-CM | POA: Diagnosis not present

## 2014-03-19 DIAGNOSIS — J9801 Acute bronchospasm: Secondary | ICD-10-CM | POA: Diagnosis not present

## 2014-03-19 DIAGNOSIS — M9903 Segmental and somatic dysfunction of lumbar region: Secondary | ICD-10-CM | POA: Diagnosis not present

## 2014-03-19 DIAGNOSIS — J209 Acute bronchitis, unspecified: Secondary | ICD-10-CM | POA: Diagnosis not present

## 2014-03-19 DIAGNOSIS — M9905 Segmental and somatic dysfunction of pelvic region: Secondary | ICD-10-CM | POA: Diagnosis not present

## 2014-03-19 DIAGNOSIS — M9902 Segmental and somatic dysfunction of thoracic region: Secondary | ICD-10-CM | POA: Diagnosis not present

## 2014-03-19 DIAGNOSIS — M5441 Lumbago with sciatica, right side: Secondary | ICD-10-CM | POA: Diagnosis not present

## 2014-03-19 DIAGNOSIS — M546 Pain in thoracic spine: Secondary | ICD-10-CM | POA: Diagnosis not present

## 2014-03-21 ENCOUNTER — Ambulatory Visit (INDEPENDENT_AMBULATORY_CARE_PROVIDER_SITE_OTHER): Payer: Medicare Other | Admitting: Cardiology

## 2014-03-21 VITALS — BP 110/62 | HR 71 | Ht 69.0 in | Wt 203.5 lb

## 2014-03-21 DIAGNOSIS — E669 Obesity, unspecified: Secondary | ICD-10-CM | POA: Diagnosis not present

## 2014-03-21 DIAGNOSIS — I10 Essential (primary) hypertension: Secondary | ICD-10-CM | POA: Diagnosis not present

## 2014-03-21 DIAGNOSIS — I2510 Atherosclerotic heart disease of native coronary artery without angina pectoris: Secondary | ICD-10-CM | POA: Diagnosis not present

## 2014-03-21 DIAGNOSIS — F17210 Nicotine dependence, cigarettes, uncomplicated: Secondary | ICD-10-CM

## 2014-03-21 DIAGNOSIS — Z9861 Coronary angioplasty status: Secondary | ICD-10-CM | POA: Diagnosis not present

## 2014-03-21 DIAGNOSIS — I2119 ST elevation (STEMI) myocardial infarction involving other coronary artery of inferior wall: Secondary | ICD-10-CM

## 2014-03-21 DIAGNOSIS — E785 Hyperlipidemia, unspecified: Secondary | ICD-10-CM | POA: Diagnosis not present

## 2014-03-21 NOTE — Patient Instructions (Signed)
STOP ASPIRIN  STOP SMOKING ----Your physician discussed the hazards of tobacco use. Tobacco use cessation is recommended and techniques and options to help you quit were discussed.     Your physician wants you to follow-up in [place].  You will receive a reminder letter in the mail two months in advance. If you don't receive a letter, please call our office to schedule the follow-up appointment.

## 2014-03-23 ENCOUNTER — Encounter: Payer: Self-pay | Admitting: Cardiology

## 2014-03-23 NOTE — Assessment & Plan Note (Signed)
No active anginal symptoms. He has been on aspirin plus Plavix. I will stop aspirin since he no longer has stents to be concerned about. He is on ACE inhibitor, beta blocker and theoretically on statin - but he indicates he has not been taking it routinely.[name] Dickerson

## 2014-03-23 NOTE — Progress Notes (Signed)
PATIENT: [name] Dickerson MRN: [id number]  DOB: 21-Dec-1945   DOV:03/23/2014 PCP: Suzan Garibaldi, [place], [place].  Clinic Note: Chief Complaint  Patient presents with  . Annual Exam    pt denied chest pain and SOB  . Coronary Artery Disease    History of inferior STEMI - now with occluded RCA   HPI: [name] Dickerson is a 68 y.o.  male with a coronary artery disease history as described below presents for annual followup visit.  Inferior STEMI in 1995 -- PTCA of RCA  04/2001: Recurrent angina - overlapping BMS x 2 mRCA  09/2001: Cutting Balloon PTCA for ISR  2012: 100% mRCA occlusion, R-R bridging & L-R collaterals  2013: Myoview with Large Inferior "Infarct" w/ mild per-infarct ischemia  12/2012: Echo: EF 50-55%, distal Inferior HK  Interval History: He presents today doing relatively well and no major complaints.  He still has a little fatigue and lack of ago. He related more to feeling lazy. He otherwise has not been taking Crestor routinely, because he thinks it may have something to do with it. No recent kidney stone issues. He is to stay active but is not doing routine exercise. He is working a lot that limits his time. His weight remains stable which was an issue before. He has not noted as much exertional dyspnea as it had before.  He denies any chest tightness or pressure with exertion. No dyspnea position. No PND, orthopnea or edema. No lightheadedness, dizziness, wooziness or syncope/near syncope. No TIA or amaurosis fugax symptoms. No claudication symptoms. No melena, hematochezia or hematuria.  Past Medical History  Diagnosis Date  . ST elevation myocardial infarction (STEMI) of inferior wall, subsequent episode of care 1995    PTCA of RCA; large inferior scar noted on Myoview. Mid inferior and inferoseptal hypokinesis on echocardiogram November 2014. EF 50-55%.  Marland Kitchen CAD S/P percutaneous coronary angioplasty 1995, 2003    Inferior STEMI-PTCA RCA; 2003: Overlapping MultiLink Zeta  BMS -RCA (4.0 x 23, 4.0 x 15); 09/2001 Cutting Balloon PTCA of stents for ISR.;; Myoview August 2013 - Mod-severe sized fixed INFERIOR perfusion defect => likely infarct with mild peri-infarct ischemia in the apical inferior wall, EF 55% with distal inferior hypokinesis  . Stenosis of coronary stent 2012    Mid RCA Occlusion with Right-To-Left and Left-To-Right Collaterals.; Relatively normal LAD, ramus intermedius and circumflex-none.  . Hyperlipidemia LDL goal <70   . Essential hypertension   . Hypothyroidism   . Low testosterone   . Moderate cigarette smoker (10-19 per day)     Was down to 5 cigarettes a day.   Allergies  Allergen Reactions  . Statins     Soreness with Crestor    Current Outpatient Prescriptions  Medication Sig Dispense Refill  . Cinnamon 500 MG TABS Take 2,000 mg by mouth 2 (two) times daily.    . clopidogrel (PLAVIX) 75 MG tablet Take 75 mg by mouth daily with breakfast.    . levothyroxine (SYNTHROID, LEVOTHROID) 175 MCG tablet Take 1 tablet by mouth daily.    Marland Kitchen lisinopril (PRINIVIL,ZESTRIL) 30 MG tablet Take 30 mg by mouth daily.    . metoprolol (LOPRESSOR) 50 MG tablet Take 50 mg by mouth 2 (two) times daily.    . Niacin (NICOTINIC ACID ER PO) Take 500 mg by mouth daily.    . nitroGLYCERIN (NITROSTAT) 0.4 MG SL tablet Place 1 tablet (0.4 mg total) under the tongue every 5 (five) minutes as needed for chest pain. 25  tablet 3  . predniSONE (DELTASONE) 10 MG tablet     . testosterone cypionate (DEPO-TESTOSTERONE) 200 MG/ML injection Inject 200 mg into the muscle every 14 (fourteen) days.     No current facility-administered medications for this visit.    History   Social History Narrative   Married father of 3, grandfather 25. Works out occasionally on the treadmill. Coping to try get back into some exercise now. Unfortunately back up to smoking one pack per day. Does not drink alcohol.      Date from from 59 or 6 cigarettes a day. He's tried electronic  cigarettes and down without affect.   ROS: A comprehensive Review of Systems -  Was performed Review of Systems  Constitutional: Positive for malaise/fatigue (Overall stable to improved.).  HENT: Negative for nosebleeds.   Respiratory: Positive for cough (Mild daily cough) and shortness of breath (only with notable exertion). Negative for sputum production and wheezing.   Cardiovascular: Negative for claudication.  Gastrointestinal: Negative for blood in stool and melena.  Genitourinary: Positive for flank pain (Occasional flank pain and mild dysuria/hematuria when he has nephrolithiasis. -- None recently). Negative for hematuria.  Musculoskeletal: Positive for joint pain.  Neurological: Negative for focal weakness.  Endo/Heme/Allergies: Bruises/bleeds easily.  All other systems reviewed and are negative.  PHYSICAL EXAM BP 110/62 mmHg  Pulse 71  Ht 5\' 9"  (1.753 m)  Wt 203 lb 8 oz (92.307 kg)  BMI 30.04 kg/m2 General appearance: alert& oriented x3, cooperative, appears stated age, no distress and mildly obese; well groomed/well-nourished Neck: no adenopathy, no carotid bruit, no JVD and supple, symmetrical, trachea midline Lungs: clear to auscultation bilaterally, normal percussion bilaterally and Nonlabored, good air movement Heart: regular rate and rhythm, S1, S2 normal, no murmur, click, rub or gallop and normal apical impulse Abdomen: soft, non-tender; bowel sounds normal; no masses,  no organomegaly and truncal obesity Extremities: extremities normal, atraumatic, no cyanosis or edema, no edema, redness or tenderness in the calves or thighs and no ulcers, gangrene or trophic changes Pulses: 2+ and symmetric Neurologic: Grossly normal  [id number] today: Yes Rate: 1 , Rhythm: NSR, nonspecific-ST-T abnormalities; Borderline criteria for MI, age undetermined  Recent Labs:  He said he had labs recently checked by PCP.Marland Kitchen  ASSESSMENT / PLAN: ST elevation myocardial infarction  (STEMI) of inferior wall, subsequent episode of care Back in 1990 history PTCA. He then had several stents placed to the RCA which probably were occluded. There is a notable scar on Myoview and hypokinesis on echocardiogram. Thankfully his EF has remained stable and he has not had any anginal or heart pain or symptoms. Provided his left coronary system remains patent, he should be fine.   CAD S/P percutaneous coronary angioplasty No active anginal symptoms. He has been on aspirin plus Plavix. I will stop aspirin since he no longer has stents to be concerned about. He is on ACE inhibitor, beta blocker and theoretically on statin - but he indicates he has not been taking it routinely..   Hyperlipidemia LDL goal <70 Apparently has not been taking Crestor regularly. He is on niacin. I would like to see what his labs are from his last PCP check. Erasmo Downer to try taking Crestor to don't think this was making him feel "lazy."   Essential hypertension Well controlled on stable doses of ACE inhibitor and beta blocker   Moderate cigarette smoker (10-19 per day) I admonished him to stop smoking. He was doing very well, and now is back to one half  to a full pack a day.  Smoking cessation instruction/counseling given:  counseled patient on the dangers of tobacco use, advised patient to stop smoking, and reviewed strategies to maximize success ; 4-5 min.   Obesity (BMI 30-39.9) Database his weight is currently stable. The patient understands the need to lose weight with diet and exercise. We have discussed specific strategies for this.      Orders Placed This Encounter  Procedures  . EKG 12-Lead     Followup:  One year  Leonie Man, M.D., M.S. Interventional Cardiologist   Pager # [phone]

## 2014-03-23 NOTE — Assessment & Plan Note (Signed)
Well controlled on stable doses of ACE inhibitor and beta blocker

## 2014-03-23 NOTE — Assessment & Plan Note (Signed)
I admonished him to stop smoking. He was doing very well, and now is back to one half to a full pack a day.  Smoking cessation instruction/counseling given:  counseled patient on the dangers of tobacco use, advised patient to stop smoking, and reviewed strategies to maximize success ; 4-5 min.

## 2014-03-23 NOTE — Assessment & Plan Note (Signed)
Apparently has not been taking Crestor regularly. He is on niacin. I would like to see what his labs are from his last PCP check. [name] Dickerson Downer to try taking Crestor to don't think this was making him feel "lazy."

## 2014-03-23 NOTE — Assessment & Plan Note (Signed)
Back in 1990 history PTCA. He then had several stents placed to the RCA which probably were occluded. There is a notable scar on Myoview and hypokinesis on echocardiogram. Thankfully his EF has remained stable and he has not had any anginal or heart pain or symptoms. Provided his left coronary system remains patent, he should be fine.

## 2014-03-23 NOTE — Assessment & Plan Note (Signed)
Database his weight is currently stable. The patient understands the need to lose weight with diet and exercise. We have discussed specific strategies for this.

## 2014-04-03 DIAGNOSIS — H6690 Otitis media, unspecified, unspecified ear: Secondary | ICD-10-CM | POA: Diagnosis not present

## 2014-04-03 DIAGNOSIS — J020 Streptococcal pharyngitis: Secondary | ICD-10-CM | POA: Diagnosis not present

## 2014-04-05 DIAGNOSIS — M9903 Segmental and somatic dysfunction of lumbar region: Secondary | ICD-10-CM | POA: Diagnosis not present

## 2014-04-05 DIAGNOSIS — M9902 Segmental and somatic dysfunction of thoracic region: Secondary | ICD-10-CM | POA: Diagnosis not present

## 2014-04-05 DIAGNOSIS — M5431 Sciatica, right side: Secondary | ICD-10-CM | POA: Diagnosis not present

## 2014-04-05 DIAGNOSIS — M9905 Segmental and somatic dysfunction of pelvic region: Secondary | ICD-10-CM | POA: Diagnosis not present

## 2014-04-05 DIAGNOSIS — M5432 Sciatica, left side: Secondary | ICD-10-CM | POA: Diagnosis not present

## 2014-04-11 DIAGNOSIS — M9902 Segmental and somatic dysfunction of thoracic region: Secondary | ICD-10-CM | POA: Diagnosis not present

## 2014-04-11 DIAGNOSIS — M9905 Segmental and somatic dysfunction of pelvic region: Secondary | ICD-10-CM | POA: Diagnosis not present

## 2014-04-11 DIAGNOSIS — M9903 Segmental and somatic dysfunction of lumbar region: Secondary | ICD-10-CM | POA: Diagnosis not present

## 2014-04-11 DIAGNOSIS — M5432 Sciatica, left side: Secondary | ICD-10-CM | POA: Diagnosis not present

## 2014-04-11 DIAGNOSIS — M5431 Sciatica, right side: Secondary | ICD-10-CM | POA: Diagnosis not present

## 2014-04-26 DIAGNOSIS — M9903 Segmental and somatic dysfunction of lumbar region: Secondary | ICD-10-CM | POA: Diagnosis not present

## 2014-04-26 DIAGNOSIS — M9902 Segmental and somatic dysfunction of thoracic region: Secondary | ICD-10-CM | POA: Diagnosis not present

## 2014-04-26 DIAGNOSIS — M9905 Segmental and somatic dysfunction of pelvic region: Secondary | ICD-10-CM | POA: Diagnosis not present

## 2014-04-26 DIAGNOSIS — M5431 Sciatica, right side: Secondary | ICD-10-CM | POA: Diagnosis not present

## 2014-05-10 DIAGNOSIS — S0500XA Injury of conjunctiva and corneal abrasion without foreign body, unspecified eye, initial encounter: Secondary | ICD-10-CM | POA: Diagnosis not present

## 2014-05-10 DIAGNOSIS — H5201 Hypermetropia, right eye: Secondary | ICD-10-CM | POA: Diagnosis not present

## 2014-05-15 DIAGNOSIS — M9905 Segmental and somatic dysfunction of pelvic region: Secondary | ICD-10-CM | POA: Diagnosis not present

## 2014-05-15 DIAGNOSIS — M9903 Segmental and somatic dysfunction of lumbar region: Secondary | ICD-10-CM | POA: Diagnosis not present

## 2014-05-15 DIAGNOSIS — M9902 Segmental and somatic dysfunction of thoracic region: Secondary | ICD-10-CM | POA: Diagnosis not present

## 2014-05-15 DIAGNOSIS — M4307 Spondylolysis, lumbosacral region: Secondary | ICD-10-CM | POA: Diagnosis not present

## 2014-05-16 DIAGNOSIS — E119 Type 2 diabetes mellitus without complications: Secondary | ICD-10-CM | POA: Diagnosis not present

## 2014-05-16 DIAGNOSIS — I10 Essential (primary) hypertension: Secondary | ICD-10-CM | POA: Diagnosis not present

## 2014-05-16 DIAGNOSIS — E039 Hypothyroidism, unspecified: Secondary | ICD-10-CM | POA: Diagnosis not present

## 2014-05-16 DIAGNOSIS — M109 Gout, unspecified: Secondary | ICD-10-CM | POA: Diagnosis not present

## 2014-05-24 DIAGNOSIS — H25812 Combined forms of age-related cataract, left eye: Secondary | ICD-10-CM | POA: Diagnosis not present

## 2014-05-24 DIAGNOSIS — H2512 Age-related nuclear cataract, left eye: Secondary | ICD-10-CM | POA: Diagnosis not present

## 2014-06-05 DIAGNOSIS — M9905 Segmental and somatic dysfunction of pelvic region: Secondary | ICD-10-CM | POA: Diagnosis not present

## 2014-06-05 DIAGNOSIS — M4307 Spondylolysis, lumbosacral region: Secondary | ICD-10-CM | POA: Diagnosis not present

## 2014-06-05 DIAGNOSIS — M9902 Segmental and somatic dysfunction of thoracic region: Secondary | ICD-10-CM | POA: Diagnosis not present

## 2014-06-05 DIAGNOSIS — M9903 Segmental and somatic dysfunction of lumbar region: Secondary | ICD-10-CM | POA: Diagnosis not present

## 2014-06-18 DIAGNOSIS — H25811 Combined forms of age-related cataract, right eye: Secondary | ICD-10-CM | POA: Diagnosis not present

## 2014-06-18 DIAGNOSIS — H2511 Age-related nuclear cataract, right eye: Secondary | ICD-10-CM | POA: Diagnosis not present

## 2014-07-09 DIAGNOSIS — R52 Pain, unspecified: Secondary | ICD-10-CM | POA: Diagnosis not present

## 2014-07-09 DIAGNOSIS — J329 Chronic sinusitis, unspecified: Secondary | ICD-10-CM | POA: Diagnosis not present

## 2014-07-13 DIAGNOSIS — T798XXA Other early complications of trauma, initial encounter: Secondary | ICD-10-CM | POA: Diagnosis not present

## 2014-07-13 DIAGNOSIS — T1490 Injury, unspecified: Secondary | ICD-10-CM | POA: Diagnosis not present

## 2014-07-13 DIAGNOSIS — L03114 Cellulitis of left upper limb: Secondary | ICD-10-CM | POA: Diagnosis not present

## 2014-07-17 DIAGNOSIS — M9901 Segmental and somatic dysfunction of cervical region: Secondary | ICD-10-CM | POA: Diagnosis not present

## 2014-07-17 DIAGNOSIS — M9902 Segmental and somatic dysfunction of thoracic region: Secondary | ICD-10-CM | POA: Diagnosis not present

## 2014-07-17 DIAGNOSIS — M5413 Radiculopathy, cervicothoracic region: Secondary | ICD-10-CM | POA: Diagnosis not present

## 2014-07-17 DIAGNOSIS — M545 Low back pain: Secondary | ICD-10-CM | POA: Diagnosis not present

## 2014-07-17 DIAGNOSIS — M546 Pain in thoracic spine: Secondary | ICD-10-CM | POA: Diagnosis not present

## 2014-07-17 DIAGNOSIS — M9903 Segmental and somatic dysfunction of lumbar region: Secondary | ICD-10-CM | POA: Diagnosis not present

## 2014-07-23 DIAGNOSIS — M9902 Segmental and somatic dysfunction of thoracic region: Secondary | ICD-10-CM | POA: Diagnosis not present

## 2014-07-23 DIAGNOSIS — M9903 Segmental and somatic dysfunction of lumbar region: Secondary | ICD-10-CM | POA: Diagnosis not present

## 2014-07-23 DIAGNOSIS — M9901 Segmental and somatic dysfunction of cervical region: Secondary | ICD-10-CM | POA: Diagnosis not present

## 2014-07-23 DIAGNOSIS — M546 Pain in thoracic spine: Secondary | ICD-10-CM | POA: Diagnosis not present

## 2014-07-23 DIAGNOSIS — M545 Low back pain: Secondary | ICD-10-CM | POA: Diagnosis not present

## 2014-07-23 DIAGNOSIS — M5413 Radiculopathy, cervicothoracic region: Secondary | ICD-10-CM | POA: Diagnosis not present

## 2014-07-25 DIAGNOSIS — J019 Acute sinusitis, unspecified: Secondary | ICD-10-CM | POA: Diagnosis not present

## 2014-07-25 DIAGNOSIS — T1490 Injury, unspecified: Secondary | ICD-10-CM | POA: Diagnosis not present

## 2014-07-25 DIAGNOSIS — A4902 Methicillin resistant Staphylococcus aureus infection, unspecified site: Secondary | ICD-10-CM | POA: Diagnosis not present

## 2014-08-10 DIAGNOSIS — D510 Vitamin B12 deficiency anemia due to intrinsic factor deficiency: Secondary | ICD-10-CM | POA: Diagnosis not present

## 2014-08-21 DIAGNOSIS — M9904 Segmental and somatic dysfunction of sacral region: Secondary | ICD-10-CM | POA: Diagnosis not present

## 2014-08-21 DIAGNOSIS — M542 Cervicalgia: Secondary | ICD-10-CM | POA: Diagnosis not present

## 2014-08-21 DIAGNOSIS — M4307 Spondylolysis, lumbosacral region: Secondary | ICD-10-CM | POA: Diagnosis not present

## 2014-08-21 DIAGNOSIS — M9903 Segmental and somatic dysfunction of lumbar region: Secondary | ICD-10-CM | POA: Diagnosis not present

## 2014-08-21 DIAGNOSIS — M9901 Segmental and somatic dysfunction of cervical region: Secondary | ICD-10-CM | POA: Diagnosis not present

## 2014-08-28 DIAGNOSIS — G4700 Insomnia, unspecified: Secondary | ICD-10-CM | POA: Diagnosis not present

## 2014-08-28 DIAGNOSIS — F4321 Adjustment disorder with depressed mood: Secondary | ICD-10-CM | POA: Diagnosis not present

## 2014-08-28 DIAGNOSIS — Z716 Tobacco abuse counseling: Secondary | ICD-10-CM | POA: Diagnosis not present

## 2014-08-28 DIAGNOSIS — F519 Sleep disorder not due to a substance or known physiological condition, unspecified: Secondary | ICD-10-CM | POA: Diagnosis not present

## 2014-08-28 DIAGNOSIS — R7989 Other specified abnormal findings of blood chemistry: Secondary | ICD-10-CM | POA: Diagnosis not present

## 2014-08-28 DIAGNOSIS — R7981 Abnormal blood-gas level: Secondary | ICD-10-CM | POA: Diagnosis not present

## 2014-08-28 DIAGNOSIS — E039 Hypothyroidism, unspecified: Secondary | ICD-10-CM | POA: Diagnosis not present

## 2014-08-28 DIAGNOSIS — F17200 Nicotine dependence, unspecified, uncomplicated: Secondary | ICD-10-CM | POA: Diagnosis not present

## 2014-08-28 DIAGNOSIS — I10 Essential (primary) hypertension: Secondary | ICD-10-CM | POA: Diagnosis not present

## 2014-09-04 DIAGNOSIS — M9904 Segmental and somatic dysfunction of sacral region: Secondary | ICD-10-CM | POA: Diagnosis not present

## 2014-09-04 DIAGNOSIS — M4306 Spondylolysis, lumbar region: Secondary | ICD-10-CM | POA: Diagnosis not present

## 2014-09-04 DIAGNOSIS — M47817 Spondylosis without myelopathy or radiculopathy, lumbosacral region: Secondary | ICD-10-CM | POA: Diagnosis not present

## 2014-09-04 DIAGNOSIS — M9903 Segmental and somatic dysfunction of lumbar region: Secondary | ICD-10-CM | POA: Diagnosis not present

## 2014-09-04 DIAGNOSIS — M4307 Spondylolysis, lumbosacral region: Secondary | ICD-10-CM | POA: Diagnosis not present

## 2014-09-04 DIAGNOSIS — M9905 Segmental and somatic dysfunction of pelvic region: Secondary | ICD-10-CM | POA: Diagnosis not present

## 2014-09-05 DIAGNOSIS — M4307 Spondylolysis, lumbosacral region: Secondary | ICD-10-CM | POA: Diagnosis not present

## 2014-09-05 DIAGNOSIS — M4306 Spondylolysis, lumbar region: Secondary | ICD-10-CM | POA: Diagnosis not present

## 2014-09-05 DIAGNOSIS — M9905 Segmental and somatic dysfunction of pelvic region: Secondary | ICD-10-CM | POA: Diagnosis not present

## 2014-09-05 DIAGNOSIS — M9903 Segmental and somatic dysfunction of lumbar region: Secondary | ICD-10-CM | POA: Diagnosis not present

## 2014-09-05 DIAGNOSIS — M47817 Spondylosis without myelopathy or radiculopathy, lumbosacral region: Secondary | ICD-10-CM | POA: Diagnosis not present

## 2014-09-05 DIAGNOSIS — M9904 Segmental and somatic dysfunction of sacral region: Secondary | ICD-10-CM | POA: Diagnosis not present

## 2014-09-13 DIAGNOSIS — M9903 Segmental and somatic dysfunction of lumbar region: Secondary | ICD-10-CM | POA: Diagnosis not present

## 2014-09-13 DIAGNOSIS — M4728 Other spondylosis with radiculopathy, sacral and sacrococcygeal region: Secondary | ICD-10-CM | POA: Diagnosis not present

## 2014-09-13 DIAGNOSIS — M5432 Sciatica, left side: Secondary | ICD-10-CM | POA: Diagnosis not present

## 2014-09-13 DIAGNOSIS — M4308 Spondylolysis, sacral and sacrococcygeal region: Secondary | ICD-10-CM | POA: Diagnosis not present

## 2014-09-13 DIAGNOSIS — M9904 Segmental and somatic dysfunction of sacral region: Secondary | ICD-10-CM | POA: Diagnosis not present

## 2014-09-13 DIAGNOSIS — M9905 Segmental and somatic dysfunction of pelvic region: Secondary | ICD-10-CM | POA: Diagnosis not present

## 2014-09-21 DIAGNOSIS — M4308 Spondylolysis, sacral and sacrococcygeal region: Secondary | ICD-10-CM | POA: Diagnosis not present

## 2014-09-21 DIAGNOSIS — M9904 Segmental and somatic dysfunction of sacral region: Secondary | ICD-10-CM | POA: Diagnosis not present

## 2014-09-21 DIAGNOSIS — M5432 Sciatica, left side: Secondary | ICD-10-CM | POA: Diagnosis not present

## 2014-09-21 DIAGNOSIS — M9905 Segmental and somatic dysfunction of pelvic region: Secondary | ICD-10-CM | POA: Diagnosis not present

## 2014-09-21 DIAGNOSIS — M9903 Segmental and somatic dysfunction of lumbar region: Secondary | ICD-10-CM | POA: Diagnosis not present

## 2014-09-21 DIAGNOSIS — M4728 Other spondylosis with radiculopathy, sacral and sacrococcygeal region: Secondary | ICD-10-CM | POA: Diagnosis not present

## 2014-10-04 DIAGNOSIS — D510 Vitamin B12 deficiency anemia due to intrinsic factor deficiency: Secondary | ICD-10-CM | POA: Diagnosis not present

## 2014-10-05 DIAGNOSIS — M5432 Sciatica, left side: Secondary | ICD-10-CM | POA: Diagnosis not present

## 2014-10-05 DIAGNOSIS — M4308 Spondylolysis, sacral and sacrococcygeal region: Secondary | ICD-10-CM | POA: Diagnosis not present

## 2014-10-05 DIAGNOSIS — M4728 Other spondylosis with radiculopathy, sacral and sacrococcygeal region: Secondary | ICD-10-CM | POA: Diagnosis not present

## 2014-10-05 DIAGNOSIS — M9905 Segmental and somatic dysfunction of pelvic region: Secondary | ICD-10-CM | POA: Diagnosis not present

## 2014-10-05 DIAGNOSIS — M9903 Segmental and somatic dysfunction of lumbar region: Secondary | ICD-10-CM | POA: Diagnosis not present

## 2014-10-05 DIAGNOSIS — M9904 Segmental and somatic dysfunction of sacral region: Secondary | ICD-10-CM | POA: Diagnosis not present

## 2014-10-31 DIAGNOSIS — M9904 Segmental and somatic dysfunction of sacral region: Secondary | ICD-10-CM | POA: Diagnosis not present

## 2014-10-31 DIAGNOSIS — M4307 Spondylolysis, lumbosacral region: Secondary | ICD-10-CM | POA: Diagnosis not present

## 2014-10-31 DIAGNOSIS — M9901 Segmental and somatic dysfunction of cervical region: Secondary | ICD-10-CM | POA: Diagnosis not present

## 2014-10-31 DIAGNOSIS — S39012A Strain of muscle, fascia and tendon of lower back, initial encounter: Secondary | ICD-10-CM | POA: Diagnosis not present

## 2014-10-31 DIAGNOSIS — M9903 Segmental and somatic dysfunction of lumbar region: Secondary | ICD-10-CM | POA: Diagnosis not present

## 2014-10-31 DIAGNOSIS — F17200 Nicotine dependence, unspecified, uncomplicated: Secondary | ICD-10-CM | POA: Diagnosis not present

## 2014-10-31 DIAGNOSIS — M47898 Other spondylosis, sacral and sacrococcygeal region: Secondary | ICD-10-CM | POA: Diagnosis not present

## 2014-10-31 DIAGNOSIS — Z716 Tobacco abuse counseling: Secondary | ICD-10-CM | POA: Diagnosis not present

## 2014-10-31 DIAGNOSIS — H6691 Otitis media, unspecified, right ear: Secondary | ICD-10-CM | POA: Diagnosis not present

## 2014-10-31 DIAGNOSIS — R52 Pain, unspecified: Secondary | ICD-10-CM | POA: Diagnosis not present

## 2014-10-31 DIAGNOSIS — M542 Cervicalgia: Secondary | ICD-10-CM | POA: Diagnosis not present

## 2014-11-09 DIAGNOSIS — D510 Vitamin B12 deficiency anemia due to intrinsic factor deficiency: Secondary | ICD-10-CM | POA: Diagnosis not present

## 2014-11-19 DIAGNOSIS — M9904 Segmental and somatic dysfunction of sacral region: Secondary | ICD-10-CM | POA: Diagnosis not present

## 2014-11-19 DIAGNOSIS — M9901 Segmental and somatic dysfunction of cervical region: Secondary | ICD-10-CM | POA: Diagnosis not present

## 2014-11-19 DIAGNOSIS — M47898 Other spondylosis, sacral and sacrococcygeal region: Secondary | ICD-10-CM | POA: Diagnosis not present

## 2014-11-19 DIAGNOSIS — M9903 Segmental and somatic dysfunction of lumbar region: Secondary | ICD-10-CM | POA: Diagnosis not present

## 2014-11-19 DIAGNOSIS — M542 Cervicalgia: Secondary | ICD-10-CM | POA: Diagnosis not present

## 2014-11-19 DIAGNOSIS — M4307 Spondylolysis, lumbosacral region: Secondary | ICD-10-CM | POA: Diagnosis not present

## 2014-11-26 DIAGNOSIS — D510 Vitamin B12 deficiency anemia due to intrinsic factor deficiency: Secondary | ICD-10-CM | POA: Diagnosis not present

## 2014-11-26 DIAGNOSIS — R5383 Other fatigue: Secondary | ICD-10-CM | POA: Diagnosis not present

## 2014-11-27 DIAGNOSIS — S61431A Puncture wound without foreign body of right hand, initial encounter: Secondary | ICD-10-CM | POA: Diagnosis not present

## 2014-11-27 DIAGNOSIS — T798XXA Other early complications of trauma, initial encounter: Secondary | ICD-10-CM | POA: Diagnosis not present

## 2014-11-27 DIAGNOSIS — H6691 Otitis media, unspecified, right ear: Secondary | ICD-10-CM | POA: Diagnosis not present

## 2014-12-04 DIAGNOSIS — R5383 Other fatigue: Secondary | ICD-10-CM | POA: Diagnosis not present

## 2014-12-10 DIAGNOSIS — H9190 Unspecified hearing loss, unspecified ear: Secondary | ICD-10-CM | POA: Diagnosis not present

## 2014-12-10 DIAGNOSIS — Z77122 Contact with and (suspected) exposure to noise: Secondary | ICD-10-CM | POA: Diagnosis not present

## 2014-12-11 DIAGNOSIS — M5414 Radiculopathy, thoracic region: Secondary | ICD-10-CM | POA: Diagnosis not present

## 2014-12-11 DIAGNOSIS — M9904 Segmental and somatic dysfunction of sacral region: Secondary | ICD-10-CM | POA: Diagnosis not present

## 2014-12-11 DIAGNOSIS — M5388 Other specified dorsopathies, sacral and sacrococcygeal region: Secondary | ICD-10-CM | POA: Diagnosis not present

## 2014-12-11 DIAGNOSIS — M5417 Radiculopathy, lumbosacral region: Secondary | ICD-10-CM | POA: Diagnosis not present

## 2014-12-11 DIAGNOSIS — M9902 Segmental and somatic dysfunction of thoracic region: Secondary | ICD-10-CM | POA: Diagnosis not present

## 2014-12-11 DIAGNOSIS — M9905 Segmental and somatic dysfunction of pelvic region: Secondary | ICD-10-CM | POA: Diagnosis not present

## 2014-12-21 DIAGNOSIS — F519 Sleep disorder not due to a substance or known physiological condition, unspecified: Secondary | ICD-10-CM | POA: Diagnosis not present

## 2014-12-21 DIAGNOSIS — F17200 Nicotine dependence, unspecified, uncomplicated: Secondary | ICD-10-CM | POA: Diagnosis not present

## 2014-12-21 DIAGNOSIS — I10 Essential (primary) hypertension: Secondary | ICD-10-CM | POA: Diagnosis not present

## 2014-12-21 DIAGNOSIS — E039 Hypothyroidism, unspecified: Secondary | ICD-10-CM | POA: Diagnosis not present

## 2014-12-21 DIAGNOSIS — R350 Frequency of micturition: Secondary | ICD-10-CM | POA: Diagnosis not present

## 2014-12-21 DIAGNOSIS — Z716 Tobacco abuse counseling: Secondary | ICD-10-CM | POA: Diagnosis not present

## 2014-12-21 DIAGNOSIS — R7989 Other specified abnormal findings of blood chemistry: Secondary | ICD-10-CM | POA: Diagnosis not present

## 2014-12-21 DIAGNOSIS — F4321 Adjustment disorder with depressed mood: Secondary | ICD-10-CM | POA: Diagnosis not present

## 2014-12-31 DIAGNOSIS — M4302 Spondylolysis, cervical region: Secondary | ICD-10-CM | POA: Diagnosis not present

## 2014-12-31 DIAGNOSIS — M5116 Intervertebral disc disorders with radiculopathy, lumbar region: Secondary | ICD-10-CM | POA: Diagnosis not present

## 2014-12-31 DIAGNOSIS — M9901 Segmental and somatic dysfunction of cervical region: Secondary | ICD-10-CM | POA: Diagnosis not present

## 2014-12-31 DIAGNOSIS — M9904 Segmental and somatic dysfunction of sacral region: Secondary | ICD-10-CM | POA: Diagnosis not present

## 2014-12-31 DIAGNOSIS — M9903 Segmental and somatic dysfunction of lumbar region: Secondary | ICD-10-CM | POA: Diagnosis not present

## 2014-12-31 DIAGNOSIS — M4727 Other spondylosis with radiculopathy, lumbosacral region: Secondary | ICD-10-CM | POA: Diagnosis not present

## 2015-01-29 DIAGNOSIS — M7742 Metatarsalgia, left foot: Secondary | ICD-10-CM | POA: Diagnosis not present

## 2015-01-29 DIAGNOSIS — M6702 Short Achilles tendon (acquired), left ankle: Secondary | ICD-10-CM | POA: Diagnosis not present

## 2015-01-30 DIAGNOSIS — M4302 Spondylolysis, cervical region: Secondary | ICD-10-CM | POA: Diagnosis not present

## 2015-01-30 DIAGNOSIS — M5116 Intervertebral disc disorders with radiculopathy, lumbar region: Secondary | ICD-10-CM | POA: Diagnosis not present

## 2015-01-30 DIAGNOSIS — M4727 Other spondylosis with radiculopathy, lumbosacral region: Secondary | ICD-10-CM | POA: Diagnosis not present

## 2015-01-30 DIAGNOSIS — M9904 Segmental and somatic dysfunction of sacral region: Secondary | ICD-10-CM | POA: Diagnosis not present

## 2015-01-30 DIAGNOSIS — M9903 Segmental and somatic dysfunction of lumbar region: Secondary | ICD-10-CM | POA: Diagnosis not present

## 2015-01-30 DIAGNOSIS — M9901 Segmental and somatic dysfunction of cervical region: Secondary | ICD-10-CM | POA: Diagnosis not present

## 2015-02-08 DIAGNOSIS — M25552 Pain in left hip: Secondary | ICD-10-CM | POA: Diagnosis not present

## 2015-02-08 DIAGNOSIS — M1990 Unspecified osteoarthritis, unspecified site: Secondary | ICD-10-CM | POA: Diagnosis not present

## 2015-02-18 DIAGNOSIS — M542 Cervicalgia: Secondary | ICD-10-CM | POA: Diagnosis not present

## 2015-02-18 DIAGNOSIS — M9901 Segmental and somatic dysfunction of cervical region: Secondary | ICD-10-CM | POA: Diagnosis not present

## 2015-02-18 DIAGNOSIS — M47814 Spondylosis without myelopathy or radiculopathy, thoracic region: Secondary | ICD-10-CM | POA: Diagnosis not present

## 2015-02-18 DIAGNOSIS — M9902 Segmental and somatic dysfunction of thoracic region: Secondary | ICD-10-CM | POA: Diagnosis not present

## 2015-02-18 DIAGNOSIS — M4727 Other spondylosis with radiculopathy, lumbosacral region: Secondary | ICD-10-CM | POA: Diagnosis not present

## 2015-02-18 DIAGNOSIS — M9903 Segmental and somatic dysfunction of lumbar region: Secondary | ICD-10-CM | POA: Diagnosis not present

## 2015-02-28 DIAGNOSIS — M7742 Metatarsalgia, left foot: Secondary | ICD-10-CM | POA: Diagnosis not present

## 2015-02-28 DIAGNOSIS — M6702 Short Achilles tendon (acquired), left ankle: Secondary | ICD-10-CM | POA: Diagnosis not present

## 2015-03-03 HISTORY — PX: PENILE PROSTHESIS IMPLANT: SHX240

## 2015-03-19 DIAGNOSIS — Z0000 Encounter for general adult medical examination without abnormal findings: Secondary | ICD-10-CM | POA: Diagnosis not present

## 2015-03-19 DIAGNOSIS — E0590 Thyrotoxicosis, unspecified without thyrotoxic crisis or storm: Secondary | ICD-10-CM | POA: Diagnosis not present

## 2015-03-19 DIAGNOSIS — I10 Essential (primary) hypertension: Secondary | ICD-10-CM | POA: Diagnosis not present

## 2015-03-19 DIAGNOSIS — F4321 Adjustment disorder with depressed mood: Secondary | ICD-10-CM | POA: Diagnosis not present

## 2015-03-19 DIAGNOSIS — Z139 Encounter for screening, unspecified: Secondary | ICD-10-CM | POA: Diagnosis not present

## 2015-03-19 DIAGNOSIS — G4701 Insomnia due to medical condition: Secondary | ICD-10-CM | POA: Diagnosis not present

## 2015-03-19 DIAGNOSIS — R7989 Other specified abnormal findings of blood chemistry: Secondary | ICD-10-CM | POA: Diagnosis not present

## 2015-03-19 DIAGNOSIS — E039 Hypothyroidism, unspecified: Secondary | ICD-10-CM | POA: Diagnosis not present

## 2015-03-19 DIAGNOSIS — R5383 Other fatigue: Secondary | ICD-10-CM | POA: Diagnosis not present

## 2015-03-19 DIAGNOSIS — E7800 Pure hypercholesterolemia, unspecified: Secondary | ICD-10-CM | POA: Diagnosis not present

## 2015-03-19 DIAGNOSIS — Z760 Encounter for issue of repeat prescription: Secondary | ICD-10-CM | POA: Diagnosis not present

## 2015-03-21 DIAGNOSIS — M4308 Spondylolysis, sacral and sacrococcygeal region: Secondary | ICD-10-CM | POA: Diagnosis not present

## 2015-03-21 DIAGNOSIS — M9901 Segmental and somatic dysfunction of cervical region: Secondary | ICD-10-CM | POA: Diagnosis not present

## 2015-03-21 DIAGNOSIS — M4302 Spondylolysis, cervical region: Secondary | ICD-10-CM | POA: Diagnosis not present

## 2015-03-21 DIAGNOSIS — M9904 Segmental and somatic dysfunction of sacral region: Secondary | ICD-10-CM | POA: Diagnosis not present

## 2015-03-21 DIAGNOSIS — M4306 Spondylolysis, lumbar region: Secondary | ICD-10-CM | POA: Diagnosis not present

## 2015-03-21 DIAGNOSIS — M9903 Segmental and somatic dysfunction of lumbar region: Secondary | ICD-10-CM | POA: Diagnosis not present

## 2015-03-22 ENCOUNTER — Encounter: Payer: Self-pay | Admitting: Cardiology

## 2015-03-22 ENCOUNTER — Ambulatory Visit (INDEPENDENT_AMBULATORY_CARE_PROVIDER_SITE_OTHER): Payer: Medicare Other | Admitting: Cardiology

## 2015-03-22 VITALS — BP 120/68 | HR 65 | Ht 69.0 in | Wt 201.0 lb

## 2015-03-22 DIAGNOSIS — I2119 ST elevation (STEMI) myocardial infarction involving other coronary artery of inferior wall: Secondary | ICD-10-CM

## 2015-03-22 DIAGNOSIS — I2510 Atherosclerotic heart disease of native coronary artery without angina pectoris: Secondary | ICD-10-CM

## 2015-03-22 DIAGNOSIS — E669 Obesity, unspecified: Secondary | ICD-10-CM

## 2015-03-22 DIAGNOSIS — E785 Hyperlipidemia, unspecified: Secondary | ICD-10-CM

## 2015-03-22 DIAGNOSIS — Z9861 Coronary angioplasty status: Secondary | ICD-10-CM

## 2015-03-22 DIAGNOSIS — I10 Essential (primary) hypertension: Secondary | ICD-10-CM | POA: Diagnosis not present

## 2015-03-22 DIAGNOSIS — F17210 Nicotine dependence, cigarettes, uncomplicated: Secondary | ICD-10-CM

## 2015-03-22 DIAGNOSIS — E291 Testicular hypofunction: Secondary | ICD-10-CM

## 2015-03-22 DIAGNOSIS — R7989 Other specified abnormal findings of blood chemistry: Secondary | ICD-10-CM

## 2015-03-22 NOTE — Patient Instructions (Signed)
NO CHANGE IN CURRENT MEDICATIONS  Your physician wants you to follow-up in 12 MONTHS WITH DR HARDING.  You will receive a reminder letter in the mail two months in advance. If you don't receive a letter, please call our office to schedule the follow-up appointment.  If you need a refill on your cardiac medications before your next appointment, please call your pharmacy.   

## 2015-03-22 NOTE — Progress Notes (Signed)
PCP: Suzan Garibaldi, FNP  Clinic Note: Chief Complaint  Patient presents with  . Follow-up    no chest pain, no shortness of breath, no swelling, no cramping, no dizziness or lightheadedness  . Coronary Artery Disease    Inferior STEMI in 1995 PTCA of RCA. - Now known RCA occlusion.    HPI: [name] Dickerson is a 70 y.o. male with a PMH below who presents today for annual f/u.   Inferior STEMI in 1995 -- PTCA of RCA  04/2001: Recurrent angina - overlapping BMS x 2 mRCA  09/2001: Cutting Balloon PTCA for ISR  2012: 100% mRCA occlusion, R-R bridging & L-R collaterals  2013: Myoview with Large Inferior "Infarct" w/ mild per-infarct ischemia  12/2012: Echo: EF 50-55%, distal Inferior HK  [name] was last seen on 03/21/2014  Recent Hospitalizations: None - was Dx with cutaneous MRSA -- has been on Abx.   Studies Reviewed: None  Interval History: [name] Dickerson presents today feeling pretty well. He denies any significant cardiac symptoms. His biggest issue over the last several months has been dealing with cutaneous MRSA. Now that he is no longer on high-dose antibiotics and prednisone, he started to feel back to normal. Her cardiac standpoint he really has no significant issues. He freely admits that he is not as active as he should be, but is trying to make sure he gets some exercise in. Cardiac review of symptoms:   No chest pain or shortness of breath with rest or exertion.  No PND, orthopnea or edema.  No palpitations, lightheadedness, dizziness, weakness or syncope/near syncope. No TIA/amaurosis fugax symptoms. No claudication.  ROS: A comprehensive was performed. Review of Systems  Constitutional: Negative for malaise/fatigue.  HENT: Negative for nosebleeds.   Eyes: Negative for blurred vision.  Respiratory: Negative for shortness of breath.   Cardiovascular: Negative.        Per HPI  Gastrointestinal: Negative for heartburn, blood in stool and melena.  Genitourinary:  Negative for hematuria.  Musculoskeletal: Positive for joint pain (Normal arthritis pains).  Skin:       Healing cutaneous MRSA lesions.  Neurological: Negative for weakness and headaches.  Endo/Heme/Allergies: Does not bruise/bleed easily.  Psychiatric/Behavioral: Negative.   All other systems reviewed and are negative.    Past Medical History  Diagnosis Date  . ST elevation myocardial infarction (STEMI) of inferior wall, subsequent episode of care [facility]) 1995    PTCA of RCA; large inferior scar noted on Myoview. Mid inferior and inferoseptal hypokinesis on echocardiogram November 2014. EF 50-55%.  Marland Kitchen CAD S/P percutaneous coronary angioplasty 1995, 2003    Inferior STEMI-PTCA RCA; 2003: Overlapping MultiLink Zeta BMS -RCA (4.0 x 23, 4.0 x 15); 09/2001 Cutting Balloon PTCA of stents for ISR.;; Myoview August 2013 - Mod-severe sized fixed INFERIOR perfusion defect => likely infarct with mild peri-infarct ischemia in the apical inferior wall, EF 55% with distal inferior hypokinesis  . Stenosis of coronary stent 2012    Mid RCA Occlusion with Right-To-Left and Left-To-Right Collaterals.; Relatively normal LAD, ramus intermedius and circumflex-none.  . Hyperlipidemia LDL goal <70   . Essential hypertension   . Hypothyroidism   . Low testosterone   . Moderate cigarette smoker (10-19 per day)     Was down to 5 cigarettes a day.    Past Surgical History  Procedure Laterality Date  . Coronary angioplasty  1995    Inferior STEMI-PTCA of RCA  . Coronary stent placement  2003    MultiLink Zeta BMS  4.0 mm x 23 mm, 4.0 mm x 15 mm --> Cutting Balloon PTCA for ISR several months later.  . Cardiac catheterization  March 2012    Mid occlusion of RCA with Right to Left and Left to Right collaterals; LAD, ramus and circumflex/OM widely patent  . Leane Call  August 2013    Moderate-severe sized INFERIOR perfusion defect with no evidence of ischemia. EF 55% with distal inferior HK. Large sized  Inferior and Inferolateral Infarct with perhaps mild apical inferior ischemia.  . Transthoracic echocardiogram  01/09/2013    EF 50-55%; normal LV size and function. Probable mild HJ of the mid inferior and inferoseptal wall.; No valvular lesions.    Prior to Admission medications   Medication Sig Start Date End Date Taking? Authorizing Provider  ALPRAZolam Duanne Moron) 0.5 MG tablet Take 0.5 mg by mouth at bedtime. 03/19/15  Yes Historical Provider, MD  Cinnamon 500 MG TABS Take 2,000 mg by mouth 2 (two) times daily.   Yes Historical Provider, MD  clopidogrel (PLAVIX) 75 MG tablet Take 75 mg by mouth daily with breakfast.   Yes Historical Provider, MD  levothyroxine (SYNTHROID, LEVOTHROID) 175 MCG tablet Take 1 tablet by mouth daily. 03/19/14  Yes Historical Provider, MD  lisinopril (PRINIVIL,ZESTRIL) 30 MG tablet Take 30 mg by mouth daily.   Yes Historical Provider, MD  metoprolol (LOPRESSOR) 50 MG tablet Take 50 mg by mouth 2 (two) times daily.   Yes Historical Provider, MD  Niacin (NICOTINIC ACID ER PO) Take 500 mg by mouth daily.   Yes Historical Provider, MD  nitroGLYCERIN (NITROSTAT) 0.4 MG SL tablet Place 1 tablet (0.4 mg total) under the tongue every 5 (five) minutes as needed for chest pain. 05/15/13  Yes Leonie Man, MD  testosterone cypionate (DEPO-TESTOSTERONE) 200 MG/ML injection Inject 200 mg into the muscle every 14 (fourteen) days.   Yes Historical Provider, MD   Allergies  Allergen Reactions  . Statins     Soreness with Crestor     Social History   Social History  . Marital Status: Married    Spouse Name: N/A  . Number of Children: N/A  . Years of Education: N/A   Social History Main Topics  . Smoking status: Current Every Day Smoker -- 1.00 packs/day for 40 years    Types: Cigarettes  . Smokeless tobacco: Never Used  . Alcohol Use: No  . Drug Use: No  . Sexual Activity: Not Asked   Other Topics Concern  . None   Social History Narrative   Married father of 3,  grandfather 47. Works out occasionally on the treadmill. Coping to try get back into some exercise now. Unfortunately back up to smoking one pack per day. Does not drink alcohol.      Date from from 22 or 6 cigarettes a day. He's tried electronic cigarettes and down without affect.    History reviewed. No pertinent family history.   Wt Readings from Last 3 Encounters:  03/22/15 201 lb (91.173 kg)  03/21/14 203 lb 8 oz (92.307 kg)  03/10/13 204 lb 1.6 oz (92.579 kg)    PHYSICAL EXAM BP 120/68 mmHg  Pulse 65  Ht 5\' 9"  (1.753 m)  Wt 201 lb (91.173 kg)  BMI 29.67 kg/m2 General appearance: alert& oriented x3, cooperative, appears stated age, no distress and borderline obese; well groomed/well-nourished Neck: no adenopathy, no carotid bruit, no JVD and supple, symmetrical, trachea midline Lungs: clear to auscultation bilaterally, normal percussion bilaterally and Nonlabored, good air movement  Heart: regular rate and rhythm, S1, S2 normal, no murmur, click, rub or gallop and normal apical impulse Abdomen: soft, non-tender; bowel sounds normal; no masses, no organomegaly and truncal obesity Extremities: extremities normal, atraumatic, no cyanosis or edema, no edema, redness or tenderness in the calves or thighs and no ulcers, gangrene or trophic changes Pulses: 2+ and symmetric Neurologic: Grossly normal   Adult ECG Report  Rate: 65 ;  Rhythm: normal sinus rhythm and Nonspecific T-wave abnormality. Otherwise normal axis, intervals and durations.;   Narrative Interpretation: Stable EKG   Other studies Reviewed: Additional studies/ records that were reviewed today include:  Recent Labs:  - PCP recently checked    ASSESSMENT / PLAN: Problem List Items Addressed This Visit    ST elevation myocardial infarction (STEMI) of inferior wall, subsequent episode of care [facility]) (Chronic)    His initial MI was related to his RCA, and basically all his cardiac history revolve around his RCA. Now  noted to be occluded with scar noted on Myoview and echocardiogram. Does still have a preserved EF with no further anginal symptoms or heart failure symptoms. Continue risk factor modification to avoid any left coronary artery issues.      Obesity (BMI 30-39.9) (Chronic)    He has now finally been able to lose a little bit of weight. He is trying to adjust his diet and increase his exercise. He is now below the "obesity threshold".      Moderate cigarette smoker (10-19 per day) (Chronic)    He still smokes now anywhere from a 1/2 pack - one full pack per day. He knowledge is that he needs to stop, and promises to try to cut back. Smoking cessation instruction/counseling given:  counseled patient on the dangers of tobacco use, advised patient to stop smoking, and reviewed strategies to maximize success      Low testosterone (Chronic)    Again we talked about the importance of trying to "target low normal range to symptom relief ". Would not want to over treat to a "normal range "      Hyperlipidemia LDL goal <70 (Chronic)    Myalgias, fatigue and laziness wall taking statins. He is on niacin. He is due to get labs checked by PCP. Provided they're stable with lipid levels at goal range, continue without more aggressive treatment. If not, I would like to refer him back to Tommy Medal, RPH-CCP in our Lipid Clinic to consider initiating PCSK9-Inhibitor therapy. I did indicate to him, that there is a significant amount of paperwork that needs to be of filled out for this procedure, and that he would need to at least try statins again.      Relevant Orders   EKG 12-Lead   Essential hypertension (Chronic)    Excellent control on current medications. No change.      Relevant Orders   EKG 12-Lead   Coronary artery disease, occlusive - Primary (Chronic)    No active anginal symptoms. Now on Plavix for maintenance. On stable dose of beta blocker and ACE inhibitor. Has had myalgias and  arthralgias while on statins         Current medicines are reviewed at length with the patient today. (+/- concerns) none The following changes have been made:  NO CHANGE IN CURRENT MEDICATIONS.   Your physician wants you to follow-up in [place].  Studies Ordered:   Orders Placed This Encounter  Procedures  . EKG 12-Lead  Leonie Man, M.D., M.S. Interventional Cardiologist   Pager # [phone] Phone # [phone] [place], [place] [address]

## 2015-03-24 ENCOUNTER — Encounter: Payer: Self-pay | Admitting: Cardiology

## 2015-03-24 NOTE — Assessment & Plan Note (Signed)
Myalgias, fatigue and laziness wall taking statins. He is on niacin. He is due to get labs checked by PCP. Provided they're stable with lipid levels at goal range, continue without more aggressive treatment. If not, I would like to refer him back to Tommy Medal, RPH-CCP in our Lipid Clinic to consider initiating PCSK9-Inhibitor therapy. I did indicate to him, that there is a significant amount of paperwork that needs to be of filled out for this procedure, and that he would need to at least try statins again.

## 2015-03-24 NOTE — Assessment & Plan Note (Signed)
He still smokes now anywhere from a 1/2 pack - one full pack per day. He knowledge is that he needs to stop, and promises to try to cut back. Smoking cessation instruction/counseling given:  counseled patient on the dangers of tobacco use, advised patient to stop smoking, and reviewed strategies to maximize success

## 2015-03-24 NOTE — Assessment & Plan Note (Signed)
No active anginal symptoms. Now on Plavix for maintenance. On stable dose of beta blocker and ACE inhibitor. Has had myalgias and arthralgias while on statins

## 2015-03-24 NOTE — Assessment & Plan Note (Signed)
Again we talked about the importance of trying to "target low normal range to symptom relief ". Would not want to over treat to a "normal range "

## 2015-03-24 NOTE — Assessment & Plan Note (Signed)
Excellent control on current medications. No change.

## 2015-03-24 NOTE — Assessment & Plan Note (Signed)
His initial MI was related to his RCA, and basically all his cardiac history revolve around his RCA. Now noted to be occluded with scar noted on Myoview and echocardiogram. Does still have a preserved EF with no further anginal symptoms or heart failure symptoms. Continue risk factor modification to avoid any left coronary artery issues.

## 2015-03-24 NOTE — Assessment & Plan Note (Signed)
He has now finally been able to lose a little bit of weight. He is trying to adjust his diet and increase his exercise. He is now below the "obesity threshold".

## 2015-04-22 DIAGNOSIS — M9901 Segmental and somatic dysfunction of cervical region: Secondary | ICD-10-CM | POA: Diagnosis not present

## 2015-04-22 DIAGNOSIS — M4302 Spondylolysis, cervical region: Secondary | ICD-10-CM | POA: Diagnosis not present

## 2015-04-22 DIAGNOSIS — M4307 Spondylolysis, lumbosacral region: Secondary | ICD-10-CM | POA: Diagnosis not present

## 2015-04-22 DIAGNOSIS — M5388 Other specified dorsopathies, sacral and sacrococcygeal region: Secondary | ICD-10-CM | POA: Diagnosis not present

## 2015-04-22 DIAGNOSIS — M9904 Segmental and somatic dysfunction of sacral region: Secondary | ICD-10-CM | POA: Diagnosis not present

## 2015-04-22 DIAGNOSIS — M9903 Segmental and somatic dysfunction of lumbar region: Secondary | ICD-10-CM | POA: Diagnosis not present

## 2015-04-23 DIAGNOSIS — R5383 Other fatigue: Secondary | ICD-10-CM | POA: Diagnosis not present

## 2015-05-15 DIAGNOSIS — M9903 Segmental and somatic dysfunction of lumbar region: Secondary | ICD-10-CM | POA: Diagnosis not present

## 2015-05-15 DIAGNOSIS — M5388 Other specified dorsopathies, sacral and sacrococcygeal region: Secondary | ICD-10-CM | POA: Diagnosis not present

## 2015-05-15 DIAGNOSIS — M9904 Segmental and somatic dysfunction of sacral region: Secondary | ICD-10-CM | POA: Diagnosis not present

## 2015-05-15 DIAGNOSIS — M9901 Segmental and somatic dysfunction of cervical region: Secondary | ICD-10-CM | POA: Diagnosis not present

## 2015-05-15 DIAGNOSIS — M4302 Spondylolysis, cervical region: Secondary | ICD-10-CM | POA: Diagnosis not present

## 2015-05-15 DIAGNOSIS — M4307 Spondylolysis, lumbosacral region: Secondary | ICD-10-CM | POA: Diagnosis not present

## 2015-05-24 DIAGNOSIS — R5383 Other fatigue: Secondary | ICD-10-CM | POA: Diagnosis not present

## 2015-06-06 DIAGNOSIS — Z139 Encounter for screening, unspecified: Secondary | ICD-10-CM | POA: Diagnosis not present

## 2015-06-06 DIAGNOSIS — E039 Hypothyroidism, unspecified: Secondary | ICD-10-CM | POA: Diagnosis not present

## 2015-06-06 DIAGNOSIS — E7800 Pure hypercholesterolemia, unspecified: Secondary | ICD-10-CM | POA: Diagnosis not present

## 2015-06-07 DIAGNOSIS — E119 Type 2 diabetes mellitus without complications: Secondary | ICD-10-CM | POA: Diagnosis not present

## 2015-06-07 DIAGNOSIS — J3489 Other specified disorders of nose and nasal sinuses: Secondary | ICD-10-CM | POA: Diagnosis not present

## 2015-06-07 DIAGNOSIS — F17200 Nicotine dependence, unspecified, uncomplicated: Secondary | ICD-10-CM | POA: Diagnosis not present

## 2015-06-07 DIAGNOSIS — R5383 Other fatigue: Secondary | ICD-10-CM | POA: Diagnosis not present

## 2015-06-07 DIAGNOSIS — Z716 Tobacco abuse counseling: Secondary | ICD-10-CM | POA: Diagnosis not present

## 2015-06-13 DIAGNOSIS — R5383 Other fatigue: Secondary | ICD-10-CM | POA: Diagnosis not present

## 2015-06-19 DIAGNOSIS — M9903 Segmental and somatic dysfunction of lumbar region: Secondary | ICD-10-CM | POA: Diagnosis not present

## 2015-06-19 DIAGNOSIS — M4302 Spondylolysis, cervical region: Secondary | ICD-10-CM | POA: Diagnosis not present

## 2015-06-19 DIAGNOSIS — M9901 Segmental and somatic dysfunction of cervical region: Secondary | ICD-10-CM | POA: Diagnosis not present

## 2015-06-19 DIAGNOSIS — M4306 Spondylolysis, lumbar region: Secondary | ICD-10-CM | POA: Diagnosis not present

## 2015-06-19 DIAGNOSIS — M47814 Spondylosis without myelopathy or radiculopathy, thoracic region: Secondary | ICD-10-CM | POA: Diagnosis not present

## 2015-06-19 DIAGNOSIS — M9902 Segmental and somatic dysfunction of thoracic region: Secondary | ICD-10-CM | POA: Diagnosis not present

## 2015-07-08 DIAGNOSIS — I10 Essential (primary) hypertension: Secondary | ICD-10-CM | POA: Diagnosis not present

## 2015-07-08 DIAGNOSIS — E119 Type 2 diabetes mellitus without complications: Secondary | ICD-10-CM | POA: Diagnosis not present

## 2015-07-08 DIAGNOSIS — E039 Hypothyroidism, unspecified: Secondary | ICD-10-CM | POA: Diagnosis not present

## 2015-07-08 DIAGNOSIS — Z139 Encounter for screening, unspecified: Secondary | ICD-10-CM | POA: Diagnosis not present

## 2015-07-08 DIAGNOSIS — R7989 Other specified abnormal findings of blood chemistry: Secondary | ICD-10-CM | POA: Diagnosis not present

## 2015-07-08 DIAGNOSIS — E7800 Pure hypercholesterolemia, unspecified: Secondary | ICD-10-CM | POA: Diagnosis not present

## 2015-07-09 DIAGNOSIS — R799 Abnormal finding of blood chemistry, unspecified: Secondary | ICD-10-CM | POA: Diagnosis not present

## 2015-07-09 DIAGNOSIS — E039 Hypothyroidism, unspecified: Secondary | ICD-10-CM | POA: Diagnosis not present

## 2015-07-10 DIAGNOSIS — R5383 Other fatigue: Secondary | ICD-10-CM | POA: Diagnosis not present

## 2015-07-22 DIAGNOSIS — Z0000 Encounter for general adult medical examination without abnormal findings: Secondary | ICD-10-CM | POA: Diagnosis not present

## 2015-07-22 DIAGNOSIS — M4302 Spondylolysis, cervical region: Secondary | ICD-10-CM | POA: Diagnosis not present

## 2015-07-22 DIAGNOSIS — M47814 Spondylosis without myelopathy or radiculopathy, thoracic region: Secondary | ICD-10-CM | POA: Diagnosis not present

## 2015-07-22 DIAGNOSIS — M4306 Spondylolysis, lumbar region: Secondary | ICD-10-CM | POA: Diagnosis not present

## 2015-07-22 DIAGNOSIS — M9901 Segmental and somatic dysfunction of cervical region: Secondary | ICD-10-CM | POA: Diagnosis not present

## 2015-07-22 DIAGNOSIS — M9903 Segmental and somatic dysfunction of lumbar region: Secondary | ICD-10-CM | POA: Diagnosis not present

## 2015-07-22 DIAGNOSIS — M9902 Segmental and somatic dysfunction of thoracic region: Secondary | ICD-10-CM | POA: Diagnosis not present

## 2015-08-05 DIAGNOSIS — J9801 Acute bronchospasm: Secondary | ICD-10-CM | POA: Diagnosis not present

## 2015-08-05 DIAGNOSIS — Z716 Tobacco abuse counseling: Secondary | ICD-10-CM | POA: Diagnosis not present

## 2015-08-05 DIAGNOSIS — J209 Acute bronchitis, unspecified: Secondary | ICD-10-CM | POA: Diagnosis not present

## 2015-08-05 DIAGNOSIS — F17200 Nicotine dependence, unspecified, uncomplicated: Secondary | ICD-10-CM | POA: Diagnosis not present

## 2015-08-16 DIAGNOSIS — R5383 Other fatigue: Secondary | ICD-10-CM | POA: Diagnosis not present

## 2015-08-20 DIAGNOSIS — M47812 Spondylosis without myelopathy or radiculopathy, cervical region: Secondary | ICD-10-CM | POA: Diagnosis not present

## 2015-08-20 DIAGNOSIS — M5388 Other specified dorsopathies, sacral and sacrococcygeal region: Secondary | ICD-10-CM | POA: Diagnosis not present

## 2015-08-20 DIAGNOSIS — M9901 Segmental and somatic dysfunction of cervical region: Secondary | ICD-10-CM | POA: Diagnosis not present

## 2015-08-20 DIAGNOSIS — M4307 Spondylolysis, lumbosacral region: Secondary | ICD-10-CM | POA: Diagnosis not present

## 2015-08-20 DIAGNOSIS — M9904 Segmental and somatic dysfunction of sacral region: Secondary | ICD-10-CM | POA: Diagnosis not present

## 2015-08-20 DIAGNOSIS — M9903 Segmental and somatic dysfunction of lumbar region: Secondary | ICD-10-CM | POA: Diagnosis not present

## 2015-08-23 DIAGNOSIS — E119 Type 2 diabetes mellitus without complications: Secondary | ICD-10-CM | POA: Diagnosis not present

## 2015-08-23 DIAGNOSIS — H5213 Myopia, bilateral: Secondary | ICD-10-CM | POA: Diagnosis not present

## 2015-09-05 DIAGNOSIS — L03116 Cellulitis of left lower limb: Secondary | ICD-10-CM | POA: Diagnosis not present

## 2015-09-05 DIAGNOSIS — R52 Pain, unspecified: Secondary | ICD-10-CM | POA: Diagnosis not present

## 2015-09-05 DIAGNOSIS — Z139 Encounter for screening, unspecified: Secondary | ICD-10-CM | POA: Diagnosis not present

## 2015-09-05 DIAGNOSIS — R5383 Other fatigue: Secondary | ICD-10-CM | POA: Diagnosis not present

## 2015-09-05 DIAGNOSIS — D485 Neoplasm of uncertain behavior of skin: Secondary | ICD-10-CM | POA: Diagnosis not present

## 2015-09-06 ENCOUNTER — Telehealth: Payer: Self-pay | Admitting: Cardiology

## 2015-09-06 DIAGNOSIS — F5232 Male orgasmic disorder: Secondary | ICD-10-CM | POA: Diagnosis not present

## 2015-09-06 DIAGNOSIS — N3000 Acute cystitis without hematuria: Secondary | ICD-10-CM | POA: Diagnosis not present

## 2015-09-06 DIAGNOSIS — N401 Enlarged prostate with lower urinary tract symptoms: Secondary | ICD-10-CM | POA: Diagnosis not present

## 2015-09-06 DIAGNOSIS — N318 Other neuromuscular dysfunction of bladder: Secondary | ICD-10-CM | POA: Diagnosis not present

## 2015-09-06 DIAGNOSIS — E291 Testicular hypofunction: Secondary | ICD-10-CM | POA: Diagnosis not present

## 2015-09-06 DIAGNOSIS — N5202 Corporo-venous occlusive erectile dysfunction: Secondary | ICD-10-CM | POA: Diagnosis not present

## 2015-09-06 NOTE — Telephone Encounter (Signed)
Pt needs a letter stating that it is all right for him to have penial implant.Please fax to Dr Jamse Arn -[phone]

## 2015-09-06 NOTE — Telephone Encounter (Signed)
Request for surgical clearance:  1. What type of surgery is being performed? Penile implant  2. When is this surgery scheduled? TBD  3. Are there any medications that need to be held prior to surgery and how long? Plavix  4. Name of physician performing surgery? Dr. Emi Holes  5. What is your office phone and fax number?  Phone: [phone] Fax: [phone]

## 2015-09-09 NOTE — Telephone Encounter (Signed)
Low risk for surgery. Stop plavix 5 days prior and restart 48 hours after surgery if no bleeding.  Dr. Debara Pickett (for Dr. Ellyn Hack)

## 2015-09-09 NOTE — Telephone Encounter (Signed)
Clearance routed via EPIC to Dr. Emi Holes

## 2015-09-10 DIAGNOSIS — E291 Testicular hypofunction: Secondary | ICD-10-CM | POA: Diagnosis not present

## 2015-09-10 DIAGNOSIS — N5202 Corporo-venous occlusive erectile dysfunction: Secondary | ICD-10-CM | POA: Diagnosis not present

## 2015-09-10 DIAGNOSIS — N3020 Other chronic cystitis without hematuria: Secondary | ICD-10-CM | POA: Diagnosis not present

## 2015-09-10 DIAGNOSIS — N401 Enlarged prostate with lower urinary tract symptoms: Secondary | ICD-10-CM | POA: Diagnosis not present

## 2015-09-10 DIAGNOSIS — F5232 Male orgasmic disorder: Secondary | ICD-10-CM | POA: Diagnosis not present

## 2015-09-17 DIAGNOSIS — E291 Testicular hypofunction: Secondary | ICD-10-CM | POA: Diagnosis not present

## 2015-09-17 DIAGNOSIS — F5232 Male orgasmic disorder: Secondary | ICD-10-CM | POA: Diagnosis not present

## 2015-09-17 DIAGNOSIS — N5202 Corporo-venous occlusive erectile dysfunction: Secondary | ICD-10-CM | POA: Diagnosis not present

## 2015-09-17 DIAGNOSIS — N3020 Other chronic cystitis without hematuria: Secondary | ICD-10-CM | POA: Diagnosis not present

## 2015-09-17 DIAGNOSIS — R351 Nocturia: Secondary | ICD-10-CM | POA: Diagnosis not present

## 2015-09-17 DIAGNOSIS — N401 Enlarged prostate with lower urinary tract symptoms: Secondary | ICD-10-CM | POA: Diagnosis not present

## 2015-09-18 DIAGNOSIS — M9903 Segmental and somatic dysfunction of lumbar region: Secondary | ICD-10-CM | POA: Diagnosis not present

## 2015-09-18 DIAGNOSIS — M4307 Spondylolysis, lumbosacral region: Secondary | ICD-10-CM | POA: Diagnosis not present

## 2015-09-18 DIAGNOSIS — M9901 Segmental and somatic dysfunction of cervical region: Secondary | ICD-10-CM | POA: Diagnosis not present

## 2015-09-18 DIAGNOSIS — M5388 Other specified dorsopathies, sacral and sacrococcygeal region: Secondary | ICD-10-CM | POA: Diagnosis not present

## 2015-09-18 DIAGNOSIS — M9904 Segmental and somatic dysfunction of sacral region: Secondary | ICD-10-CM | POA: Diagnosis not present

## 2015-09-18 DIAGNOSIS — M47812 Spondylosis without myelopathy or radiculopathy, cervical region: Secondary | ICD-10-CM | POA: Diagnosis not present

## 2015-09-19 DIAGNOSIS — Z79899 Other long term (current) drug therapy: Secondary | ICD-10-CM | POA: Diagnosis not present

## 2015-09-19 DIAGNOSIS — N529 Male erectile dysfunction, unspecified: Secondary | ICD-10-CM | POA: Diagnosis not present

## 2015-09-19 DIAGNOSIS — Z8673 Personal history of transient ischemic attack (TIA), and cerebral infarction without residual deficits: Secondary | ICD-10-CM | POA: Diagnosis not present

## 2015-09-19 DIAGNOSIS — F17200 Nicotine dependence, unspecified, uncomplicated: Secondary | ICD-10-CM | POA: Diagnosis not present

## 2015-09-19 DIAGNOSIS — F5232 Male orgasmic disorder: Secondary | ICD-10-CM | POA: Diagnosis not present

## 2015-09-19 DIAGNOSIS — I2510 Atherosclerotic heart disease of native coronary artery without angina pectoris: Secondary | ICD-10-CM | POA: Diagnosis not present

## 2015-09-19 DIAGNOSIS — N5202 Corporo-venous occlusive erectile dysfunction: Secondary | ICD-10-CM | POA: Diagnosis not present

## 2015-09-19 DIAGNOSIS — I10 Essential (primary) hypertension: Secondary | ICD-10-CM | POA: Diagnosis not present

## 2015-09-19 DIAGNOSIS — F17210 Nicotine dependence, cigarettes, uncomplicated: Secondary | ICD-10-CM | POA: Diagnosis not present

## 2015-09-19 DIAGNOSIS — Z955 Presence of coronary angioplasty implant and graft: Secondary | ICD-10-CM | POA: Diagnosis not present

## 2015-09-19 DIAGNOSIS — G4733 Obstructive sleep apnea (adult) (pediatric): Secondary | ICD-10-CM | POA: Diagnosis not present

## 2015-09-19 DIAGNOSIS — E039 Hypothyroidism, unspecified: Secondary | ICD-10-CM | POA: Diagnosis not present

## 2015-09-19 DIAGNOSIS — G4730 Sleep apnea, unspecified: Secondary | ICD-10-CM | POA: Diagnosis not present

## 2015-09-20 DIAGNOSIS — I2510 Atherosclerotic heart disease of native coronary artery without angina pectoris: Secondary | ICD-10-CM | POA: Diagnosis not present

## 2015-09-20 DIAGNOSIS — Z955 Presence of coronary angioplasty implant and graft: Secondary | ICD-10-CM | POA: Diagnosis not present

## 2015-09-20 DIAGNOSIS — N529 Male erectile dysfunction, unspecified: Secondary | ICD-10-CM | POA: Diagnosis not present

## 2015-09-20 DIAGNOSIS — Z79899 Other long term (current) drug therapy: Secondary | ICD-10-CM | POA: Diagnosis not present

## 2015-09-20 DIAGNOSIS — I10 Essential (primary) hypertension: Secondary | ICD-10-CM | POA: Diagnosis not present

## 2015-09-20 DIAGNOSIS — F17200 Nicotine dependence, unspecified, uncomplicated: Secondary | ICD-10-CM | POA: Diagnosis not present

## 2015-10-01 DIAGNOSIS — I10 Essential (primary) hypertension: Secondary | ICD-10-CM | POA: Diagnosis not present

## 2015-10-01 DIAGNOSIS — F17299 Nicotine dependence, other tobacco product, with unspecified nicotine-induced disorders: Secondary | ICD-10-CM | POA: Diagnosis not present

## 2015-10-01 DIAGNOSIS — E7800 Pure hypercholesterolemia, unspecified: Secondary | ICD-10-CM | POA: Diagnosis not present

## 2015-10-01 DIAGNOSIS — N5202 Corporo-venous occlusive erectile dysfunction: Secondary | ICD-10-CM | POA: Diagnosis not present

## 2015-10-01 DIAGNOSIS — E039 Hypothyroidism, unspecified: Secondary | ICD-10-CM | POA: Diagnosis not present

## 2015-10-01 DIAGNOSIS — E860 Dehydration: Secondary | ICD-10-CM | POA: Diagnosis not present

## 2015-10-01 DIAGNOSIS — F17200 Nicotine dependence, unspecified, uncomplicated: Secondary | ICD-10-CM | POA: Diagnosis not present

## 2015-10-01 DIAGNOSIS — F5232 Male orgasmic disorder: Secondary | ICD-10-CM | POA: Diagnosis not present

## 2015-10-01 DIAGNOSIS — E119 Type 2 diabetes mellitus without complications: Secondary | ICD-10-CM | POA: Diagnosis not present

## 2015-10-01 DIAGNOSIS — Z716 Tobacco abuse counseling: Secondary | ICD-10-CM | POA: Diagnosis not present

## 2015-10-01 DIAGNOSIS — E291 Testicular hypofunction: Secondary | ICD-10-CM | POA: Diagnosis not present

## 2015-10-01 DIAGNOSIS — N3020 Other chronic cystitis without hematuria: Secondary | ICD-10-CM | POA: Diagnosis not present

## 2015-10-01 DIAGNOSIS — R5383 Other fatigue: Secondary | ICD-10-CM | POA: Diagnosis not present

## 2015-10-01 DIAGNOSIS — N401 Enlarged prostate with lower urinary tract symptoms: Secondary | ICD-10-CM | POA: Diagnosis not present

## 2015-10-18 DIAGNOSIS — E291 Testicular hypofunction: Secondary | ICD-10-CM | POA: Diagnosis not present

## 2015-10-18 DIAGNOSIS — N401 Enlarged prostate with lower urinary tract symptoms: Secondary | ICD-10-CM | POA: Diagnosis not present

## 2015-10-18 DIAGNOSIS — N5202 Corporo-venous occlusive erectile dysfunction: Secondary | ICD-10-CM | POA: Diagnosis not present

## 2015-10-18 DIAGNOSIS — N3020 Other chronic cystitis without hematuria: Secondary | ICD-10-CM | POA: Diagnosis not present

## 2015-10-18 DIAGNOSIS — F5232 Male orgasmic disorder: Secondary | ICD-10-CM | POA: Diagnosis not present

## 2015-10-22 DIAGNOSIS — M9903 Segmental and somatic dysfunction of lumbar region: Secondary | ICD-10-CM | POA: Diagnosis not present

## 2015-10-22 DIAGNOSIS — M4307 Spondylolysis, lumbosacral region: Secondary | ICD-10-CM | POA: Diagnosis not present

## 2015-10-22 DIAGNOSIS — M5413 Radiculopathy, cervicothoracic region: Secondary | ICD-10-CM | POA: Diagnosis not present

## 2015-10-22 DIAGNOSIS — M542 Cervicalgia: Secondary | ICD-10-CM | POA: Diagnosis not present

## 2015-10-22 DIAGNOSIS — M9901 Segmental and somatic dysfunction of cervical region: Secondary | ICD-10-CM | POA: Diagnosis not present

## 2015-10-22 DIAGNOSIS — M9902 Segmental and somatic dysfunction of thoracic region: Secondary | ICD-10-CM | POA: Diagnosis not present

## 2015-10-30 DIAGNOSIS — M9902 Segmental and somatic dysfunction of thoracic region: Secondary | ICD-10-CM | POA: Diagnosis not present

## 2015-10-30 DIAGNOSIS — M5413 Radiculopathy, cervicothoracic region: Secondary | ICD-10-CM | POA: Diagnosis not present

## 2015-10-30 DIAGNOSIS — M4307 Spondylolysis, lumbosacral region: Secondary | ICD-10-CM | POA: Diagnosis not present

## 2015-10-30 DIAGNOSIS — M9901 Segmental and somatic dysfunction of cervical region: Secondary | ICD-10-CM | POA: Diagnosis not present

## 2015-10-30 DIAGNOSIS — M542 Cervicalgia: Secondary | ICD-10-CM | POA: Diagnosis not present

## 2015-10-30 DIAGNOSIS — M9903 Segmental and somatic dysfunction of lumbar region: Secondary | ICD-10-CM | POA: Diagnosis not present

## 2015-11-05 DIAGNOSIS — N3020 Other chronic cystitis without hematuria: Secondary | ICD-10-CM | POA: Diagnosis not present

## 2015-11-05 DIAGNOSIS — F5232 Male orgasmic disorder: Secondary | ICD-10-CM | POA: Diagnosis not present

## 2015-11-05 DIAGNOSIS — N401 Enlarged prostate with lower urinary tract symptoms: Secondary | ICD-10-CM | POA: Diagnosis not present

## 2015-11-05 DIAGNOSIS — N5202 Corporo-venous occlusive erectile dysfunction: Secondary | ICD-10-CM | POA: Diagnosis not present

## 2015-11-05 DIAGNOSIS — E291 Testicular hypofunction: Secondary | ICD-10-CM | POA: Diagnosis not present

## 2015-11-15 DIAGNOSIS — M4307 Spondylolysis, lumbosacral region: Secondary | ICD-10-CM | POA: Diagnosis not present

## 2015-11-15 DIAGNOSIS — M9901 Segmental and somatic dysfunction of cervical region: Secondary | ICD-10-CM | POA: Diagnosis not present

## 2015-11-15 DIAGNOSIS — M542 Cervicalgia: Secondary | ICD-10-CM | POA: Diagnosis not present

## 2015-11-15 DIAGNOSIS — M5413 Radiculopathy, cervicothoracic region: Secondary | ICD-10-CM | POA: Diagnosis not present

## 2015-11-15 DIAGNOSIS — M9903 Segmental and somatic dysfunction of lumbar region: Secondary | ICD-10-CM | POA: Diagnosis not present

## 2015-11-15 DIAGNOSIS — M9902 Segmental and somatic dysfunction of thoracic region: Secondary | ICD-10-CM | POA: Diagnosis not present

## 2015-11-18 DIAGNOSIS — F17200 Nicotine dependence, unspecified, uncomplicated: Secondary | ICD-10-CM | POA: Diagnosis not present

## 2015-11-18 DIAGNOSIS — G4700 Insomnia, unspecified: Secondary | ICD-10-CM | POA: Diagnosis not present

## 2015-11-18 DIAGNOSIS — E039 Hypothyroidism, unspecified: Secondary | ICD-10-CM | POA: Diagnosis not present

## 2015-11-18 DIAGNOSIS — I10 Essential (primary) hypertension: Secondary | ICD-10-CM | POA: Diagnosis not present

## 2015-11-20 DIAGNOSIS — F17200 Nicotine dependence, unspecified, uncomplicated: Secondary | ICD-10-CM | POA: Diagnosis not present

## 2015-11-20 DIAGNOSIS — F17210 Nicotine dependence, cigarettes, uncomplicated: Secondary | ICD-10-CM | POA: Diagnosis not present

## 2015-12-04 DIAGNOSIS — M4302 Spondylolysis, cervical region: Secondary | ICD-10-CM | POA: Diagnosis not present

## 2015-12-04 DIAGNOSIS — M546 Pain in thoracic spine: Secondary | ICD-10-CM | POA: Diagnosis not present

## 2015-12-04 DIAGNOSIS — M9902 Segmental and somatic dysfunction of thoracic region: Secondary | ICD-10-CM | POA: Diagnosis not present

## 2015-12-04 DIAGNOSIS — M4306 Spondylolysis, lumbar region: Secondary | ICD-10-CM | POA: Diagnosis not present

## 2015-12-04 DIAGNOSIS — M9903 Segmental and somatic dysfunction of lumbar region: Secondary | ICD-10-CM | POA: Diagnosis not present

## 2015-12-04 DIAGNOSIS — M9901 Segmental and somatic dysfunction of cervical region: Secondary | ICD-10-CM | POA: Diagnosis not present

## 2015-12-25 DIAGNOSIS — M4306 Spondylolysis, lumbar region: Secondary | ICD-10-CM | POA: Diagnosis not present

## 2015-12-25 DIAGNOSIS — M4302 Spondylolysis, cervical region: Secondary | ICD-10-CM | POA: Diagnosis not present

## 2015-12-25 DIAGNOSIS — M9902 Segmental and somatic dysfunction of thoracic region: Secondary | ICD-10-CM | POA: Diagnosis not present

## 2015-12-25 DIAGNOSIS — M9903 Segmental and somatic dysfunction of lumbar region: Secondary | ICD-10-CM | POA: Diagnosis not present

## 2015-12-25 DIAGNOSIS — M9901 Segmental and somatic dysfunction of cervical region: Secondary | ICD-10-CM | POA: Diagnosis not present

## 2015-12-25 DIAGNOSIS — M546 Pain in thoracic spine: Secondary | ICD-10-CM | POA: Diagnosis not present

## 2015-12-31 DIAGNOSIS — R7301 Impaired fasting glucose: Secondary | ICD-10-CM | POA: Diagnosis not present

## 2016-01-07 DIAGNOSIS — E039 Hypothyroidism, unspecified: Secondary | ICD-10-CM | POA: Diagnosis not present

## 2016-01-07 DIAGNOSIS — R7303 Prediabetes: Secondary | ICD-10-CM | POA: Diagnosis not present

## 2016-01-07 DIAGNOSIS — E7800 Pure hypercholesterolemia, unspecified: Secondary | ICD-10-CM | POA: Diagnosis not present

## 2016-01-07 DIAGNOSIS — I10 Essential (primary) hypertension: Secondary | ICD-10-CM | POA: Diagnosis not present

## 2016-01-07 DIAGNOSIS — Z23 Encounter for immunization: Secondary | ICD-10-CM | POA: Diagnosis not present

## 2016-01-22 DIAGNOSIS — M9901 Segmental and somatic dysfunction of cervical region: Secondary | ICD-10-CM | POA: Diagnosis not present

## 2016-01-22 DIAGNOSIS — M9902 Segmental and somatic dysfunction of thoracic region: Secondary | ICD-10-CM | POA: Diagnosis not present

## 2016-01-22 DIAGNOSIS — M9903 Segmental and somatic dysfunction of lumbar region: Secondary | ICD-10-CM | POA: Diagnosis not present

## 2016-01-22 DIAGNOSIS — M4302 Spondylolysis, cervical region: Secondary | ICD-10-CM | POA: Diagnosis not present

## 2016-01-22 DIAGNOSIS — M546 Pain in thoracic spine: Secondary | ICD-10-CM | POA: Diagnosis not present

## 2016-01-22 DIAGNOSIS — M4307 Spondylolysis, lumbosacral region: Secondary | ICD-10-CM | POA: Diagnosis not present

## 2016-02-03 DIAGNOSIS — N3020 Other chronic cystitis without hematuria: Secondary | ICD-10-CM | POA: Diagnosis not present

## 2016-02-03 DIAGNOSIS — N538 Other male sexual dysfunction: Secondary | ICD-10-CM | POA: Diagnosis not present

## 2016-02-03 DIAGNOSIS — N401 Enlarged prostate with lower urinary tract symptoms: Secondary | ICD-10-CM | POA: Diagnosis not present

## 2016-02-18 DIAGNOSIS — M4302 Spondylolysis, cervical region: Secondary | ICD-10-CM | POA: Diagnosis not present

## 2016-02-18 DIAGNOSIS — M546 Pain in thoracic spine: Secondary | ICD-10-CM | POA: Diagnosis not present

## 2016-02-18 DIAGNOSIS — M9901 Segmental and somatic dysfunction of cervical region: Secondary | ICD-10-CM | POA: Diagnosis not present

## 2016-02-18 DIAGNOSIS — M9902 Segmental and somatic dysfunction of thoracic region: Secondary | ICD-10-CM | POA: Diagnosis not present

## 2016-02-18 DIAGNOSIS — M4307 Spondylolysis, lumbosacral region: Secondary | ICD-10-CM | POA: Diagnosis not present

## 2016-02-18 DIAGNOSIS — M9903 Segmental and somatic dysfunction of lumbar region: Secondary | ICD-10-CM | POA: Diagnosis not present

## 2016-02-24 DIAGNOSIS — I10 Essential (primary) hypertension: Secondary | ICD-10-CM | POA: Diagnosis present

## 2016-02-24 DIAGNOSIS — R1084 Generalized abdominal pain: Secondary | ICD-10-CM | POA: Diagnosis not present

## 2016-02-24 DIAGNOSIS — R7989 Other specified abnormal findings of blood chemistry: Secondary | ICD-10-CM | POA: Diagnosis present

## 2016-02-24 DIAGNOSIS — F17210 Nicotine dependence, cigarettes, uncomplicated: Secondary | ICD-10-CM | POA: Diagnosis not present

## 2016-02-24 DIAGNOSIS — R1011 Right upper quadrant pain: Secondary | ICD-10-CM | POA: Diagnosis not present

## 2016-02-24 DIAGNOSIS — Z79891 Long term (current) use of opiate analgesic: Secondary | ICD-10-CM | POA: Diagnosis not present

## 2016-02-24 DIAGNOSIS — Z7902 Long term (current) use of antithrombotics/antiplatelets: Secondary | ICD-10-CM | POA: Diagnosis not present

## 2016-02-24 DIAGNOSIS — R112 Nausea with vomiting, unspecified: Secondary | ICD-10-CM | POA: Diagnosis present

## 2016-02-24 DIAGNOSIS — Z79899 Other long term (current) drug therapy: Secondary | ICD-10-CM | POA: Diagnosis not present

## 2016-02-24 DIAGNOSIS — R1013 Epigastric pain: Secondary | ICD-10-CM | POA: Diagnosis not present

## 2016-02-24 DIAGNOSIS — K810 Acute cholecystitis: Secondary | ICD-10-CM | POA: Diagnosis not present

## 2016-02-24 DIAGNOSIS — J9811 Atelectasis: Secondary | ICD-10-CM | POA: Diagnosis not present

## 2016-02-24 DIAGNOSIS — K828 Other specified diseases of gallbladder: Secondary | ICD-10-CM | POA: Diagnosis not present

## 2016-02-24 DIAGNOSIS — D72829 Elevated white blood cell count, unspecified: Secondary | ICD-10-CM | POA: Diagnosis present

## 2016-02-24 DIAGNOSIS — K5900 Constipation, unspecified: Secondary | ICD-10-CM | POA: Diagnosis not present

## 2016-02-24 DIAGNOSIS — Z7982 Long term (current) use of aspirin: Secondary | ICD-10-CM | POA: Diagnosis not present

## 2016-02-24 DIAGNOSIS — E039 Hypothyroidism, unspecified: Secondary | ICD-10-CM | POA: Diagnosis present

## 2016-03-04 DIAGNOSIS — R1011 Right upper quadrant pain: Secondary | ICD-10-CM

## 2016-03-04 DIAGNOSIS — R7989 Other specified abnormal findings of blood chemistry: Secondary | ICD-10-CM | POA: Insufficient documentation

## 2016-03-04 DIAGNOSIS — I10 Essential (primary) hypertension: Secondary | ICD-10-CM | POA: Insufficient documentation

## 2016-03-04 DIAGNOSIS — E785 Hyperlipidemia, unspecified: Secondary | ICD-10-CM | POA: Insufficient documentation

## 2016-03-04 DIAGNOSIS — F17210 Nicotine dependence, cigarettes, uncomplicated: Secondary | ICD-10-CM | POA: Insufficient documentation

## 2016-03-04 HISTORY — DX: Hyperlipidemia, unspecified: E78.5

## 2016-03-04 HISTORY — DX: Right upper quadrant pain: R10.11

## 2016-03-05 ENCOUNTER — Ambulatory Visit (INDEPENDENT_AMBULATORY_CARE_PROVIDER_SITE_OTHER): Payer: Medicare Other | Admitting: Physician Assistant

## 2016-03-05 ENCOUNTER — Encounter: Payer: Self-pay | Admitting: Physician Assistant

## 2016-03-05 ENCOUNTER — Telehealth: Payer: Self-pay | Admitting: *Deleted

## 2016-03-05 VITALS — BP 108/71 | HR 71 | Ht 69.0 in | Wt 191.4 lb

## 2016-03-05 DIAGNOSIS — Z01818 Encounter for other preprocedural examination: Secondary | ICD-10-CM

## 2016-03-05 DIAGNOSIS — E785 Hyperlipidemia, unspecified: Secondary | ICD-10-CM | POA: Diagnosis not present

## 2016-03-05 DIAGNOSIS — I10 Essential (primary) hypertension: Secondary | ICD-10-CM | POA: Diagnosis not present

## 2016-03-05 DIAGNOSIS — I2510 Atherosclerotic heart disease of native coronary artery without angina pectoris: Secondary | ICD-10-CM | POA: Diagnosis not present

## 2016-03-05 NOTE — Telephone Encounter (Signed)
WILL DEFER TO DR HARDING,  LAST VISIT JAN 2017, PATIENT DUE FOR ANNUAL APPOINTMENT. NOTIFY AND SET UP PATIENT WITH APPOINTMENT WITH AN EXTENDER FOR 03/05/16 3:30 DUE TO  UNAVAILABILITY FOR APPOINTMENT UNTIL FEB 2018 WITH DR HARDING.    Request for surgical clearance:  1. What type of surgery is being performed? LAPAROSCOPIC  CHOLECYSTECTOMY  2. When is this surgery scheduled? TBA  3. Are there any medications that need to be held prior to surgery and how long?PLAVIX  4. Name of physician performing surgery? DR Mitzi Hansen [place]OR[place],DO  5. What is your office phone and fax number? [place] [phone],  FAX [phone]

## 2016-03-05 NOTE — Patient Instructions (Addendum)
PLEASE TRY TO STOP SMOKING  PLEASE HAVE FAMILY DOCTOR FAX RECENT LABS TO [phone]  Your physician wants you to follow-up in: [place].  You will receive a reminder letter in the mail two months in advance. If you don't receive a letter, please call our office IN November 2018 to schedule the follow-up appointment.

## 2016-03-05 NOTE — Progress Notes (Signed)
Cardiology Office Note   Date:  03/05/2016   ID:  [name] Dickerson, DOB Sep 26, 1945, MRN [id number]  PCP:  Suzan Garibaldi, FNP  Cardiologist:  Dr Ellyn Hack 03/22/2015  Rosaria Ferries, PA-C   Chief Complaint  Patient presents with  . Follow-up    pt to have surgery     History of Present Illness: [name] Dickerson is a 71 y.o. male with a history of STEMI 1995 w/ PTCA RCA, 06/2001 w/ BMS RCA, cath 09/2001 w/ cutting balloon PTCA RCA for ISR, cath 2012 w/ RCA 100% & Lmain 40% HTN, HLD, Hypothyroid, tob use  ER visit 12/25 for abdominal pain with N&V that started 12/24. 03/04/2016, surgical consult by Dr Coralie Keens for abdominal pain. Lap choley scheduled 03/06/2016 Faxed records sent over, pt needs preop eval.  09/09/2015 Dr Debara Pickett (for Dr Ellyn Hack) stated pt low risk for penile implant surgery, stop Plavix 5 days before, restart 48 hr after if otherwise ok. Pt did with the surgery.  [name] Dickerson presents for cardiology evaluation.   He routinely walks up flights of stairs without stopping. He chases cows and dogs, occasionally catches them. Pt never gets chest pain or SOB. The pain in his abdomen on 12/25 radiated up into his chest, but he has not otherwise had chest pain. He still smokes, but is trying to cut down>>quit. He knows he needs to do this.  He has no LE edema, no orthopnea or PND. No palpitations, no presyncope or syncope. He denies wheezing, but has a faint wheeze on exam.      Past Medical History:  Diagnosis Date  . CAD S/P percutaneous coronary angioplasty 1995, 2003   Inferior STEMI-PTCA RCA; 2003: Overlapping MultiLink Zeta BMS -RCA (4.0 x 23, 4.0 x 15); 09/2001 Cutting Balloon PTCA of stents for ISR.;; Myoview August 2013 - Mod-severe sized fixed INFERIOR perfusion defect => likely infarct with mild peri-infarct ischemia in the apical inferior wall, EF 55% with distal inferior hypokinesis  . Essential hypertension   . Hyperlipidemia LDL goal <70   . Hypothyroidism   . Low  testosterone   . Moderate cigarette smoker (10-19 per day)    Was down to 5 cigarettes a day.  . ST elevation myocardial infarction (STEMI) of inferior wall, subsequent episode of care [facility]) 1995   PTCA of RCA; large inferior scar noted on Myoview. Mid inferior and inferoseptal hypokinesis on echocardiogram November 2014. EF 50-55%.  . Stenosis of coronary stent 2012   Mid RCA Occlusion with Right-To-Left and Left-To-Right Collaterals.; Relatively normal LAD, ramus intermedius and circumflex-none.    Past Surgical History:  Procedure Laterality Date  . CARDIAC CATHETERIZATION  March 2012   Mid occlusion of RCA with Right to Left and Left to Right collaterals; LAD, ramus and circumflex/OM widely patent  . CORONARY ANGIOPLASTY  1995   Inferior STEMI-PTCA of RCA  . CORONARY STENT PLACEMENT  2003   MultiLink Zeta BMS 4.0 mm x 23 mm, 4.0 mm x 15 mm --> Cutting Balloon PTCA for ISR several months later.  Marland Kitchen LexiScan Myoview  August 2013   Moderate-severe sized INFERIOR perfusion defect with no evidence of ischemia. EF 55% with distal inferior HK. Large sized Inferior and Inferolateral Infarct with perhaps mild apical inferior ischemia.  . TRANSTHORACIC ECHOCARDIOGRAM  01/09/2013   EF 50-55%; normal LV size and function. Probable mild HJ of the mid inferior and inferoseptal wall.; No valvular lesions.    Current Outpatient Prescriptions  Medication Sig Dispense Refill  .  Cinnamon 500 MG TABS Take 2,000 mg by mouth 2 (two) times daily.    . clopidogrel (PLAVIX) 75 MG tablet Take 75 mg by mouth daily with breakfast.    . levothyroxine (SYNTHROID, LEVOTHROID) 175 MCG tablet Take 1 tablet by mouth daily.    Marland Kitchen lisinopril (PRINIVIL,ZESTRIL) 30 MG tablet Take 30 mg by mouth daily.    . metoprolol (LOPRESSOR) 50 MG tablet Take 50 mg by mouth 2 (two) times daily.    . Niacin (NICOTINIC ACID ER PO) Take 500 mg by mouth daily.    . nitroGLYCERIN (NITROSTAT) 0.4 MG SL tablet Place 1 tablet (0.4 mg total)  under the tongue every 5 (five) minutes as needed for chest pain. 25 tablet 3  . oxyCODONE (OXY IR/ROXICODONE) 5 MG immediate release tablet Take 5 mg by mouth as needed.     No current facility-administered medications for this visit.     Allergies:   Statins    Social History:  The patient  reports that he has been smoking Cigarettes.  He has a 40.00 pack-year smoking history. He has never used smokeless tobacco. He reports that he does not drink alcohol or use drugs.   Family History:  [name] family history is not on file.    ROS:  Please see the history of present illness. All other systems are reviewed and negative.    PHYSICAL EXAM: VS:  BP 108/71 (BP Location: Right Arm, Patient Position: Sitting, Cuff Size: Normal)   Pulse 71   Ht 5\' 9"  (1.753 m)   Wt 191 lb 6.4 oz (86.8 kg)   BMI 28.26 kg/m  , BMI Body mass index is 28.26 kg/m. GEN: Well nourished, well developed, male in no acute distress  HEENT: normal for age  Neck: no JVD, no carotid bruit, no masses Cardiac: RRR; no murmur, no rubs, or gallops Respiratory: slight wheeze, few scattered rales bilaterally, normal work of breathing GI: soft, nontender, nondistended, + BS MS: no deformity or atrophy; no edema; distal pulses are 2+ in all 4 extremities   Skin: warm and dry, no rash Neuro:  Strength and sensation are intact Psych: euthymic mood, full affect   EKG:  EKG is ordered today. The ekg ordered today demonstrates SR, no acute changes, no sig change from 03/2015  ECHO: 01/09/2013 - Left ventricle: The cavity size was normal. Wall thickness was normal. Systolic function was normal. The estimated ejection fraction was in the range of 50% to 55%. Probable mild hypokinesis of the midinferior and inferoseptal myocardium. Left ventricular diastolic function parameters were normal. - Mitral valve: Mildly thickened leaflets . Valve area by pressure half-time: 1.91cm^2.   Recent Labs: No  results found for requested labs within last 8760 hours.    Lipid Panel    Component Value Date/Time   CHOL  05/15/2010 1048    171        ATP III CLASSIFICATION:  <200     mg/dL   Desirable  200-239  mg/dL   Borderline High  >=240    mg/dL   High          TRIG 245 (H) 05/15/2010 1048   HDL 32 (L) 05/15/2010 1048   CHOLHDL 5.3 05/15/2010 1048   VLDL 49 (H) 05/15/2010 1048   [place]  05/15/2010 1048    90        Total Cholesterol/HDL:CHD Risk Coronary Heart Disease Risk Table  Men   Women  1/2 Average Risk   3.4   3.3  Average Risk       5.0   4.4  2 X Average Risk   9.6   7.1  3 X Average Risk  23.4   11.0        Use the calculated Patient Ratio above and the CHD Risk Table to determine the patient's CHD Risk.        ATP III CLASSIFICATION (LDL):  <100     mg/dL   Optimal  100-129  mg/dL   Near or Above                    Optimal  130-159  mg/dL   Borderline  160-189  mg/dL   High  >190     mg/dL   Very High     Wt Readings from Last 3 Encounters:  03/05/16 191 lb 6.4 oz (86.8 kg)  03/22/15 201 lb (91.2 kg)  03/21/14 203 lb 8 oz (92.3 kg)     Other studies Reviewed: Additional studies/ records that were reviewed today include: office notes, hospital records and testing.  ASSESSMENT AND PLAN:  1. Preop evaluation: [name] Dickerson has not had a recent ischemic evaluation, but he has a good activity level without symptoms. His BP is well-controlled. His ECG is unchanged. His echo was 2014, but his EF was essentially normal.  [name] Dickerson is at acceptable risk for the surgery, no further cardiac workup is needed. He is ok to hold the Plavix for the surgery, restart when ok with surgeon. The form has been filled out and will be faxed today.  2. HTN: BP is well-controlled today, no med changes  3. Hyperlipidemia: Pt requested to get [place] records of his labs from his PCP.    Current medicines are reviewed at length with the patient today.  The patient does not  have concerns regarding medicines.  The following changes have been made:  no change  Labs/ tests ordered today include:  No orders of the defined types were placed in this encounter.    Disposition:   FU with Dr Ellyn Hack  Signed, Rosaria Ferries, PA-C  03/05/2016 3:34 PM    [place] Phone: [phone]; Fax: [phone]  This note was written with the assistance of speech recognition software. Please excuse any transcriptional errors.

## 2016-03-09 NOTE — Telephone Encounter (Signed)
Patient had an office appointment with [facility] PA. Patient was cleared for surgery. F/u with dr harding in 1 year ( 2019)

## 2016-03-11 DIAGNOSIS — M4306 Spondylolysis, lumbar region: Secondary | ICD-10-CM | POA: Diagnosis not present

## 2016-03-11 DIAGNOSIS — M4302 Spondylolysis, cervical region: Secondary | ICD-10-CM | POA: Diagnosis not present

## 2016-03-11 DIAGNOSIS — M4725 Other spondylosis with radiculopathy, thoracolumbar region: Secondary | ICD-10-CM | POA: Diagnosis not present

## 2016-03-11 DIAGNOSIS — M9902 Segmental and somatic dysfunction of thoracic region: Secondary | ICD-10-CM | POA: Diagnosis not present

## 2016-03-11 DIAGNOSIS — M9901 Segmental and somatic dysfunction of cervical region: Secondary | ICD-10-CM | POA: Diagnosis not present

## 2016-03-11 DIAGNOSIS — M9903 Segmental and somatic dysfunction of lumbar region: Secondary | ICD-10-CM | POA: Diagnosis not present

## 2016-03-20 DIAGNOSIS — R160 Hepatomegaly, not elsewhere classified: Secondary | ICD-10-CM | POA: Diagnosis not present

## 2016-03-20 DIAGNOSIS — I2510 Atherosclerotic heart disease of native coronary artery without angina pectoris: Secondary | ICD-10-CM | POA: Diagnosis not present

## 2016-03-20 DIAGNOSIS — E039 Hypothyroidism, unspecified: Secondary | ICD-10-CM | POA: Diagnosis not present

## 2016-03-20 DIAGNOSIS — E785 Hyperlipidemia, unspecified: Secondary | ICD-10-CM | POA: Diagnosis not present

## 2016-03-20 DIAGNOSIS — Z955 Presence of coronary angioplasty implant and graft: Secondary | ICD-10-CM | POA: Diagnosis not present

## 2016-03-20 DIAGNOSIS — F17210 Nicotine dependence, cigarettes, uncomplicated: Secondary | ICD-10-CM | POA: Diagnosis not present

## 2016-03-20 DIAGNOSIS — R1011 Right upper quadrant pain: Secondary | ICD-10-CM | POA: Diagnosis not present

## 2016-03-20 DIAGNOSIS — Z79899 Other long term (current) drug therapy: Secondary | ICD-10-CM | POA: Diagnosis not present

## 2016-03-20 DIAGNOSIS — K660 Peritoneal adhesions (postprocedural) (postinfection): Secondary | ICD-10-CM | POA: Diagnosis not present

## 2016-03-20 DIAGNOSIS — Z7902 Long term (current) use of antithrombotics/antiplatelets: Secondary | ICD-10-CM | POA: Diagnosis not present

## 2016-03-26 DIAGNOSIS — R1013 Epigastric pain: Secondary | ICD-10-CM | POA: Diagnosis not present

## 2016-03-26 DIAGNOSIS — M4306 Spondylolysis, lumbar region: Secondary | ICD-10-CM | POA: Diagnosis not present

## 2016-03-26 DIAGNOSIS — R1011 Right upper quadrant pain: Secondary | ICD-10-CM | POA: Diagnosis not present

## 2016-03-26 DIAGNOSIS — K810 Acute cholecystitis: Secondary | ICD-10-CM | POA: Diagnosis not present

## 2016-03-26 DIAGNOSIS — M9905 Segmental and somatic dysfunction of pelvic region: Secondary | ICD-10-CM | POA: Diagnosis not present

## 2016-03-26 DIAGNOSIS — M4303 Spondylolysis, cervicothoracic region: Secondary | ICD-10-CM | POA: Diagnosis not present

## 2016-03-26 DIAGNOSIS — K811 Chronic cholecystitis: Secondary | ICD-10-CM | POA: Diagnosis not present

## 2016-03-26 DIAGNOSIS — M9901 Segmental and somatic dysfunction of cervical region: Secondary | ICD-10-CM | POA: Diagnosis not present

## 2016-03-26 DIAGNOSIS — M5431 Sciatica, right side: Secondary | ICD-10-CM | POA: Diagnosis not present

## 2016-03-26 DIAGNOSIS — M9903 Segmental and somatic dysfunction of lumbar region: Secondary | ICD-10-CM | POA: Diagnosis not present

## 2016-03-27 DIAGNOSIS — R1011 Right upper quadrant pain: Secondary | ICD-10-CM | POA: Diagnosis not present

## 2016-03-27 DIAGNOSIS — K810 Acute cholecystitis: Secondary | ICD-10-CM | POA: Diagnosis not present

## 2016-03-27 DIAGNOSIS — R110 Nausea: Secondary | ICD-10-CM | POA: Diagnosis not present

## 2016-03-27 DIAGNOSIS — R1013 Epigastric pain: Secondary | ICD-10-CM | POA: Diagnosis not present

## 2016-03-30 DIAGNOSIS — K810 Acute cholecystitis: Secondary | ICD-10-CM | POA: Diagnosis not present

## 2016-03-30 DIAGNOSIS — I2510 Atherosclerotic heart disease of native coronary artery without angina pectoris: Secondary | ICD-10-CM | POA: Diagnosis not present

## 2016-03-30 DIAGNOSIS — E039 Hypothyroidism, unspecified: Secondary | ICD-10-CM | POA: Diagnosis not present

## 2016-03-30 DIAGNOSIS — E785 Hyperlipidemia, unspecified: Secondary | ICD-10-CM | POA: Diagnosis not present

## 2016-03-30 DIAGNOSIS — Z955 Presence of coronary angioplasty implant and graft: Secondary | ICD-10-CM | POA: Diagnosis not present

## 2016-03-30 DIAGNOSIS — N281 Cyst of kidney, acquired: Secondary | ICD-10-CM | POA: Diagnosis not present

## 2016-03-30 DIAGNOSIS — Z951 Presence of aortocoronary bypass graft: Secondary | ICD-10-CM | POA: Diagnosis not present

## 2016-03-30 DIAGNOSIS — K812 Acute cholecystitis with chronic cholecystitis: Secondary | ICD-10-CM | POA: Diagnosis not present

## 2016-03-30 DIAGNOSIS — Z79899 Other long term (current) drug therapy: Secondary | ICD-10-CM | POA: Diagnosis not present

## 2016-03-30 DIAGNOSIS — Z7902 Long term (current) use of antithrombotics/antiplatelets: Secondary | ICD-10-CM | POA: Diagnosis not present

## 2016-03-30 DIAGNOSIS — I10 Essential (primary) hypertension: Secondary | ICD-10-CM | POA: Diagnosis not present

## 2016-03-30 DIAGNOSIS — K811 Chronic cholecystitis: Secondary | ICD-10-CM

## 2016-03-30 DIAGNOSIS — I252 Old myocardial infarction: Secondary | ICD-10-CM | POA: Diagnosis not present

## 2016-03-30 HISTORY — DX: Chronic cholecystitis: K81.1

## 2016-03-31 DIAGNOSIS — I2510 Atherosclerotic heart disease of native coronary artery without angina pectoris: Secondary | ICD-10-CM | POA: Diagnosis not present

## 2016-03-31 DIAGNOSIS — E039 Hypothyroidism, unspecified: Secondary | ICD-10-CM | POA: Diagnosis not present

## 2016-03-31 DIAGNOSIS — Z79899 Other long term (current) drug therapy: Secondary | ICD-10-CM | POA: Diagnosis not present

## 2016-03-31 DIAGNOSIS — I10 Essential (primary) hypertension: Secondary | ICD-10-CM | POA: Diagnosis not present

## 2016-03-31 DIAGNOSIS — K812 Acute cholecystitis with chronic cholecystitis: Secondary | ICD-10-CM | POA: Diagnosis not present

## 2016-03-31 DIAGNOSIS — Z7902 Long term (current) use of antithrombotics/antiplatelets: Secondary | ICD-10-CM | POA: Diagnosis not present

## 2016-04-08 DIAGNOSIS — E7800 Pure hypercholesterolemia, unspecified: Secondary | ICD-10-CM | POA: Diagnosis not present

## 2016-04-08 DIAGNOSIS — E039 Hypothyroidism, unspecified: Secondary | ICD-10-CM | POA: Diagnosis not present

## 2016-04-08 DIAGNOSIS — I10 Essential (primary) hypertension: Secondary | ICD-10-CM | POA: Diagnosis not present

## 2016-04-08 DIAGNOSIS — R7303 Prediabetes: Secondary | ICD-10-CM | POA: Diagnosis not present

## 2016-04-15 DIAGNOSIS — I2510 Atherosclerotic heart disease of native coronary artery without angina pectoris: Secondary | ICD-10-CM | POA: Diagnosis not present

## 2016-04-15 DIAGNOSIS — F17200 Nicotine dependence, unspecified, uncomplicated: Secondary | ICD-10-CM | POA: Diagnosis not present

## 2016-04-15 DIAGNOSIS — E039 Hypothyroidism, unspecified: Secondary | ICD-10-CM | POA: Diagnosis not present

## 2016-04-15 DIAGNOSIS — E7800 Pure hypercholesterolemia, unspecified: Secondary | ICD-10-CM | POA: Diagnosis not present

## 2016-04-15 DIAGNOSIS — I10 Essential (primary) hypertension: Secondary | ICD-10-CM | POA: Diagnosis not present

## 2016-04-30 DIAGNOSIS — M9904 Segmental and somatic dysfunction of sacral region: Secondary | ICD-10-CM | POA: Diagnosis not present

## 2016-04-30 DIAGNOSIS — M9903 Segmental and somatic dysfunction of lumbar region: Secondary | ICD-10-CM | POA: Diagnosis not present

## 2016-04-30 DIAGNOSIS — M4307 Spondylolysis, lumbosacral region: Secondary | ICD-10-CM | POA: Diagnosis not present

## 2016-04-30 DIAGNOSIS — M4302 Spondylolysis, cervical region: Secondary | ICD-10-CM | POA: Diagnosis not present

## 2016-04-30 DIAGNOSIS — M9901 Segmental and somatic dysfunction of cervical region: Secondary | ICD-10-CM | POA: Diagnosis not present

## 2016-04-30 DIAGNOSIS — M5388 Other specified dorsopathies, sacral and sacrococcygeal region: Secondary | ICD-10-CM | POA: Diagnosis not present

## 2016-05-04 DIAGNOSIS — R351 Nocturia: Secondary | ICD-10-CM | POA: Diagnosis not present

## 2016-05-04 DIAGNOSIS — N401 Enlarged prostate with lower urinary tract symptoms: Secondary | ICD-10-CM | POA: Diagnosis not present

## 2016-05-04 DIAGNOSIS — N538 Other male sexual dysfunction: Secondary | ICD-10-CM | POA: Diagnosis not present

## 2016-05-04 DIAGNOSIS — N3020 Other chronic cystitis without hematuria: Secondary | ICD-10-CM | POA: Diagnosis not present

## 2016-05-11 DIAGNOSIS — M65312 Trigger thumb, left thumb: Secondary | ICD-10-CM | POA: Diagnosis not present

## 2016-05-22 DIAGNOSIS — M5388 Other specified dorsopathies, sacral and sacrococcygeal region: Secondary | ICD-10-CM | POA: Diagnosis not present

## 2016-05-22 DIAGNOSIS — M4307 Spondylolysis, lumbosacral region: Secondary | ICD-10-CM | POA: Diagnosis not present

## 2016-05-22 DIAGNOSIS — M9903 Segmental and somatic dysfunction of lumbar region: Secondary | ICD-10-CM | POA: Diagnosis not present

## 2016-05-22 DIAGNOSIS — M9901 Segmental and somatic dysfunction of cervical region: Secondary | ICD-10-CM | POA: Diagnosis not present

## 2016-05-22 DIAGNOSIS — M9904 Segmental and somatic dysfunction of sacral region: Secondary | ICD-10-CM | POA: Diagnosis not present

## 2016-05-22 DIAGNOSIS — M4302 Spondylolysis, cervical region: Secondary | ICD-10-CM | POA: Diagnosis not present

## 2016-05-27 DIAGNOSIS — E039 Hypothyroidism, unspecified: Secondary | ICD-10-CM | POA: Diagnosis not present

## 2016-05-28 DIAGNOSIS — K580 Irritable bowel syndrome with diarrhea: Secondary | ICD-10-CM | POA: Diagnosis not present

## 2016-05-28 DIAGNOSIS — Z1211 Encounter for screening for malignant neoplasm of colon: Secondary | ICD-10-CM | POA: Diagnosis not present

## 2016-06-04 DIAGNOSIS — I2510 Atherosclerotic heart disease of native coronary artery without angina pectoris: Secondary | ICD-10-CM | POA: Diagnosis not present

## 2016-06-04 DIAGNOSIS — E039 Hypothyroidism, unspecified: Secondary | ICD-10-CM | POA: Diagnosis not present

## 2016-06-04 DIAGNOSIS — D125 Benign neoplasm of sigmoid colon: Secondary | ICD-10-CM | POA: Diagnosis not present

## 2016-06-04 DIAGNOSIS — J449 Chronic obstructive pulmonary disease, unspecified: Secondary | ICD-10-CM | POA: Diagnosis not present

## 2016-06-04 DIAGNOSIS — D123 Benign neoplasm of transverse colon: Secondary | ICD-10-CM | POA: Diagnosis not present

## 2016-06-04 DIAGNOSIS — I10 Essential (primary) hypertension: Secondary | ICD-10-CM | POA: Diagnosis not present

## 2016-06-04 DIAGNOSIS — R197 Diarrhea, unspecified: Secondary | ICD-10-CM | POA: Diagnosis not present

## 2016-06-04 DIAGNOSIS — K5730 Diverticulosis of large intestine without perforation or abscess without bleeding: Secondary | ICD-10-CM | POA: Diagnosis not present

## 2016-06-04 DIAGNOSIS — K648 Other hemorrhoids: Secondary | ICD-10-CM | POA: Diagnosis not present

## 2016-06-04 DIAGNOSIS — Z79899 Other long term (current) drug therapy: Secondary | ICD-10-CM | POA: Diagnosis not present

## 2016-06-04 DIAGNOSIS — K635 Polyp of colon: Secondary | ICD-10-CM | POA: Diagnosis not present

## 2016-06-04 DIAGNOSIS — Z1211 Encounter for screening for malignant neoplasm of colon: Secondary | ICD-10-CM | POA: Diagnosis not present

## 2016-06-04 DIAGNOSIS — E7800 Pure hypercholesterolemia, unspecified: Secondary | ICD-10-CM | POA: Diagnosis not present

## 2016-06-04 DIAGNOSIS — Z86010 Personal history of colonic polyps: Secondary | ICD-10-CM | POA: Diagnosis not present

## 2016-06-05 DIAGNOSIS — M9901 Segmental and somatic dysfunction of cervical region: Secondary | ICD-10-CM | POA: Diagnosis not present

## 2016-06-05 DIAGNOSIS — M4302 Spondylolysis, cervical region: Secondary | ICD-10-CM | POA: Diagnosis not present

## 2016-06-05 DIAGNOSIS — M4306 Spondylolysis, lumbar region: Secondary | ICD-10-CM | POA: Diagnosis not present

## 2016-06-05 DIAGNOSIS — M9902 Segmental and somatic dysfunction of thoracic region: Secondary | ICD-10-CM | POA: Diagnosis not present

## 2016-06-05 DIAGNOSIS — M9903 Segmental and somatic dysfunction of lumbar region: Secondary | ICD-10-CM | POA: Diagnosis not present

## 2016-06-05 DIAGNOSIS — M4304 Spondylolysis, thoracic region: Secondary | ICD-10-CM | POA: Diagnosis not present

## 2016-06-18 DIAGNOSIS — Z1389 Encounter for screening for other disorder: Secondary | ICD-10-CM | POA: Diagnosis not present

## 2016-06-18 DIAGNOSIS — Z139 Encounter for screening, unspecified: Secondary | ICD-10-CM | POA: Diagnosis not present

## 2016-06-18 DIAGNOSIS — E039 Hypothyroidism, unspecified: Secondary | ICD-10-CM | POA: Diagnosis not present

## 2016-06-18 DIAGNOSIS — Z0000 Encounter for general adult medical examination without abnormal findings: Secondary | ICD-10-CM | POA: Diagnosis not present

## 2016-06-18 DIAGNOSIS — Z6829 Body mass index (BMI) 29.0-29.9, adult: Secondary | ICD-10-CM | POA: Diagnosis not present

## 2016-07-02 DIAGNOSIS — M4302 Spondylolysis, cervical region: Secondary | ICD-10-CM | POA: Diagnosis not present

## 2016-07-02 DIAGNOSIS — M4304 Spondylolysis, thoracic region: Secondary | ICD-10-CM | POA: Diagnosis not present

## 2016-07-02 DIAGNOSIS — M4306 Spondylolysis, lumbar region: Secondary | ICD-10-CM | POA: Diagnosis not present

## 2016-07-02 DIAGNOSIS — M9901 Segmental and somatic dysfunction of cervical region: Secondary | ICD-10-CM | POA: Diagnosis not present

## 2016-07-02 DIAGNOSIS — M9902 Segmental and somatic dysfunction of thoracic region: Secondary | ICD-10-CM | POA: Diagnosis not present

## 2016-07-02 DIAGNOSIS — M9903 Segmental and somatic dysfunction of lumbar region: Secondary | ICD-10-CM | POA: Diagnosis not present

## 2016-07-22 DIAGNOSIS — M65331 Trigger finger, right middle finger: Secondary | ICD-10-CM | POA: Diagnosis not present

## 2016-08-05 DIAGNOSIS — M5388 Other specified dorsopathies, sacral and sacrococcygeal region: Secondary | ICD-10-CM | POA: Diagnosis not present

## 2016-08-05 DIAGNOSIS — M4306 Spondylolysis, lumbar region: Secondary | ICD-10-CM | POA: Diagnosis not present

## 2016-08-05 DIAGNOSIS — M4722 Other spondylosis with radiculopathy, cervical region: Secondary | ICD-10-CM | POA: Diagnosis not present

## 2016-08-05 DIAGNOSIS — M9904 Segmental and somatic dysfunction of sacral region: Secondary | ICD-10-CM | POA: Diagnosis not present

## 2016-08-05 DIAGNOSIS — M9901 Segmental and somatic dysfunction of cervical region: Secondary | ICD-10-CM | POA: Diagnosis not present

## 2016-08-05 DIAGNOSIS — M9903 Segmental and somatic dysfunction of lumbar region: Secondary | ICD-10-CM | POA: Diagnosis not present

## 2016-09-09 DIAGNOSIS — M9901 Segmental and somatic dysfunction of cervical region: Secondary | ICD-10-CM | POA: Diagnosis not present

## 2016-09-09 DIAGNOSIS — M5388 Other specified dorsopathies, sacral and sacrococcygeal region: Secondary | ICD-10-CM | POA: Diagnosis not present

## 2016-09-09 DIAGNOSIS — M9903 Segmental and somatic dysfunction of lumbar region: Secondary | ICD-10-CM | POA: Diagnosis not present

## 2016-09-09 DIAGNOSIS — M4722 Other spondylosis with radiculopathy, cervical region: Secondary | ICD-10-CM | POA: Diagnosis not present

## 2016-09-09 DIAGNOSIS — M9904 Segmental and somatic dysfunction of sacral region: Secondary | ICD-10-CM | POA: Diagnosis not present

## 2016-09-09 DIAGNOSIS — M4306 Spondylolysis, lumbar region: Secondary | ICD-10-CM | POA: Diagnosis not present

## 2016-10-13 DIAGNOSIS — E7800 Pure hypercholesterolemia, unspecified: Secondary | ICD-10-CM | POA: Diagnosis not present

## 2016-10-13 DIAGNOSIS — I10 Essential (primary) hypertension: Secondary | ICD-10-CM | POA: Diagnosis not present

## 2016-10-13 DIAGNOSIS — R7303 Prediabetes: Secondary | ICD-10-CM | POA: Diagnosis not present

## 2016-10-13 DIAGNOSIS — E039 Hypothyroidism, unspecified: Secondary | ICD-10-CM | POA: Diagnosis not present

## 2016-10-16 DIAGNOSIS — M9904 Segmental and somatic dysfunction of sacral region: Secondary | ICD-10-CM | POA: Diagnosis not present

## 2016-10-16 DIAGNOSIS — M9901 Segmental and somatic dysfunction of cervical region: Secondary | ICD-10-CM | POA: Diagnosis not present

## 2016-10-16 DIAGNOSIS — M4302 Spondylolysis, cervical region: Secondary | ICD-10-CM | POA: Diagnosis not present

## 2016-10-16 DIAGNOSIS — M5388 Other specified dorsopathies, sacral and sacrococcygeal region: Secondary | ICD-10-CM | POA: Diagnosis not present

## 2016-10-16 DIAGNOSIS — M4307 Spondylolysis, lumbosacral region: Secondary | ICD-10-CM | POA: Diagnosis not present

## 2016-10-16 DIAGNOSIS — M9903 Segmental and somatic dysfunction of lumbar region: Secondary | ICD-10-CM | POA: Diagnosis not present

## 2016-10-19 DIAGNOSIS — R7303 Prediabetes: Secondary | ICD-10-CM | POA: Diagnosis not present

## 2016-10-20 DIAGNOSIS — E781 Pure hyperglyceridemia: Secondary | ICD-10-CM | POA: Diagnosis not present

## 2016-10-20 DIAGNOSIS — I10 Essential (primary) hypertension: Secondary | ICD-10-CM | POA: Diagnosis not present

## 2016-10-20 DIAGNOSIS — E119 Type 2 diabetes mellitus without complications: Secondary | ICD-10-CM | POA: Diagnosis not present

## 2016-10-20 DIAGNOSIS — E039 Hypothyroidism, unspecified: Secondary | ICD-10-CM | POA: Diagnosis not present

## 2016-10-20 DIAGNOSIS — Z7189 Other specified counseling: Secondary | ICD-10-CM | POA: Diagnosis not present

## 2016-11-09 DIAGNOSIS — N3020 Other chronic cystitis without hematuria: Secondary | ICD-10-CM | POA: Diagnosis not present

## 2016-11-09 DIAGNOSIS — N538 Other male sexual dysfunction: Secondary | ICD-10-CM | POA: Diagnosis not present

## 2016-11-11 DIAGNOSIS — M4307 Spondylolysis, lumbosacral region: Secondary | ICD-10-CM | POA: Diagnosis not present

## 2016-11-11 DIAGNOSIS — M4302 Spondylolysis, cervical region: Secondary | ICD-10-CM | POA: Diagnosis not present

## 2016-11-11 DIAGNOSIS — M9901 Segmental and somatic dysfunction of cervical region: Secondary | ICD-10-CM | POA: Diagnosis not present

## 2016-11-11 DIAGNOSIS — M5388 Other specified dorsopathies, sacral and sacrococcygeal region: Secondary | ICD-10-CM | POA: Diagnosis not present

## 2016-11-11 DIAGNOSIS — M9903 Segmental and somatic dysfunction of lumbar region: Secondary | ICD-10-CM | POA: Diagnosis not present

## 2016-11-11 DIAGNOSIS — M9904 Segmental and somatic dysfunction of sacral region: Secondary | ICD-10-CM | POA: Diagnosis not present

## 2016-12-04 DIAGNOSIS — M5431 Sciatica, right side: Secondary | ICD-10-CM | POA: Diagnosis not present

## 2016-12-04 DIAGNOSIS — M9903 Segmental and somatic dysfunction of lumbar region: Secondary | ICD-10-CM | POA: Diagnosis not present

## 2016-12-04 DIAGNOSIS — M4304 Spondylolysis, thoracic region: Secondary | ICD-10-CM | POA: Diagnosis not present

## 2016-12-04 DIAGNOSIS — M9902 Segmental and somatic dysfunction of thoracic region: Secondary | ICD-10-CM | POA: Diagnosis not present

## 2016-12-04 DIAGNOSIS — M9905 Segmental and somatic dysfunction of pelvic region: Secondary | ICD-10-CM | POA: Diagnosis not present

## 2016-12-04 DIAGNOSIS — M4306 Spondylolysis, lumbar region: Secondary | ICD-10-CM | POA: Diagnosis not present

## 2017-01-08 DIAGNOSIS — M9902 Segmental and somatic dysfunction of thoracic region: Secondary | ICD-10-CM | POA: Diagnosis not present

## 2017-01-08 DIAGNOSIS — M9903 Segmental and somatic dysfunction of lumbar region: Secondary | ICD-10-CM | POA: Diagnosis not present

## 2017-01-08 DIAGNOSIS — M4306 Spondylolysis, lumbar region: Secondary | ICD-10-CM | POA: Diagnosis not present

## 2017-01-08 DIAGNOSIS — M9905 Segmental and somatic dysfunction of pelvic region: Secondary | ICD-10-CM | POA: Diagnosis not present

## 2017-01-08 DIAGNOSIS — M4304 Spondylolysis, thoracic region: Secondary | ICD-10-CM | POA: Diagnosis not present

## 2017-01-08 DIAGNOSIS — M5431 Sciatica, right side: Secondary | ICD-10-CM | POA: Diagnosis not present

## 2017-01-12 DIAGNOSIS — E7800 Pure hypercholesterolemia, unspecified: Secondary | ICD-10-CM | POA: Diagnosis not present

## 2017-01-12 DIAGNOSIS — R7303 Prediabetes: Secondary | ICD-10-CM | POA: Diagnosis not present

## 2017-01-12 DIAGNOSIS — I10 Essential (primary) hypertension: Secondary | ICD-10-CM | POA: Diagnosis not present

## 2017-01-12 DIAGNOSIS — E039 Hypothyroidism, unspecified: Secondary | ICD-10-CM | POA: Diagnosis not present

## 2017-01-26 DIAGNOSIS — E119 Type 2 diabetes mellitus without complications: Secondary | ICD-10-CM | POA: Diagnosis not present

## 2017-01-26 DIAGNOSIS — I10 Essential (primary) hypertension: Secondary | ICD-10-CM | POA: Diagnosis not present

## 2017-01-26 DIAGNOSIS — E1169 Type 2 diabetes mellitus with other specified complication: Secondary | ICD-10-CM | POA: Diagnosis not present

## 2017-01-26 DIAGNOSIS — E1159 Type 2 diabetes mellitus with other circulatory complications: Secondary | ICD-10-CM | POA: Diagnosis not present

## 2017-01-26 DIAGNOSIS — E785 Hyperlipidemia, unspecified: Secondary | ICD-10-CM | POA: Diagnosis not present

## 2017-01-26 DIAGNOSIS — Z23 Encounter for immunization: Secondary | ICD-10-CM | POA: Diagnosis not present

## 2017-02-12 DIAGNOSIS — M5431 Sciatica, right side: Secondary | ICD-10-CM | POA: Diagnosis not present

## 2017-02-12 DIAGNOSIS — M4726 Other spondylosis with radiculopathy, lumbar region: Secondary | ICD-10-CM | POA: Diagnosis not present

## 2017-02-12 DIAGNOSIS — M5432 Sciatica, left side: Secondary | ICD-10-CM | POA: Diagnosis not present

## 2017-02-12 DIAGNOSIS — M9904 Segmental and somatic dysfunction of sacral region: Secondary | ICD-10-CM | POA: Diagnosis not present

## 2017-02-12 DIAGNOSIS — M4306 Spondylolysis, lumbar region: Secondary | ICD-10-CM | POA: Diagnosis not present

## 2017-02-12 DIAGNOSIS — M9903 Segmental and somatic dysfunction of lumbar region: Secondary | ICD-10-CM | POA: Diagnosis not present

## 2017-02-12 DIAGNOSIS — M5388 Other specified dorsopathies, sacral and sacrococcygeal region: Secondary | ICD-10-CM | POA: Diagnosis not present

## 2017-02-12 DIAGNOSIS — M9905 Segmental and somatic dysfunction of pelvic region: Secondary | ICD-10-CM | POA: Diagnosis not present

## 2017-02-16 DIAGNOSIS — Z6829 Body mass index (BMI) 29.0-29.9, adult: Secondary | ICD-10-CM | POA: Diagnosis not present

## 2017-02-16 DIAGNOSIS — R062 Wheezing: Secondary | ICD-10-CM | POA: Diagnosis not present

## 2017-03-01 DIAGNOSIS — M65312 Trigger thumb, left thumb: Secondary | ICD-10-CM | POA: Diagnosis not present

## 2017-03-01 DIAGNOSIS — G5603 Carpal tunnel syndrome, bilateral upper limbs: Secondary | ICD-10-CM | POA: Diagnosis not present

## 2017-03-03 ENCOUNTER — Telehealth: Payer: Self-pay | Admitting: Cardiology

## 2017-03-03 DIAGNOSIS — G5603 Carpal tunnel syndrome, bilateral upper limbs: Secondary | ICD-10-CM | POA: Diagnosis not present

## 2017-03-03 NOTE — Telephone Encounter (Signed)
Returned the call to the patient's wife, per the dpr. It was explained that typically the office doing the surgery will send a cardiac surgical clearance and then this office will review it. She verbalized her understanding.

## 2017-03-03 NOTE — Telephone Encounter (Signed)
Mrs. Eschbach is calling to find out if Mr. Mcwhirter is will need to stop his Plavix for his upcoming Hand Surgery . Please call .Marland Kitchen Thanks

## 2017-03-05 ENCOUNTER — Telehealth: Payer: Self-pay

## 2017-03-05 NOTE — Telephone Encounter (Signed)
   [facility] Pre-operative Risk Assessment    Request for surgical clearance:  1. What type of surgery is being performed? Carpel Tunnel Release  2. When is this surgery scheduled? Not scheduled yet  3. Are there any medications that need to be held prior to surgery and how long?Plavix  4. Practice name and name of physician performing surgery? [facility]  5. What is your office phone and fax number? Phone # [phone]   Fax # [phone].  6. Anesthesia type (None, local, MAC, general) ? Not listed   [name] Dickerson 03/05/2017, 5:14 PM  _________________________________________________________________   (provider comments below)

## 2017-03-08 NOTE — Telephone Encounter (Signed)
   Chart reviewed as part of pre-operative protocol coverage. Pre-op clearance already addressed by colleagues. Per [name]'s office note 03/05/17:    "[name] has not had a recent ischemic evaluation, but he has a good activity level without symptoms. His BP is well-controlled. His ECG is unchanged. His echo was 2014, but his EF was essentially normal. [name] is at acceptable risk for the surgery, no further cardiac workup is needed. He is ok to hold the Plavix for the surgery, restart when ok with surgeon. The form has been filled out and will be faxed today."    Callback staff, please call requesting office to make sure they got [name]'s fax. Will also route this bundled recommendation to requesting provider via Epic fax function. Please call with questions.  [name] Pitter, PA-C 03/08/2017, 2:32 PM

## 2017-03-08 NOTE — Telephone Encounter (Signed)
Called to let [place] know that we have re-faxed pt's cardiac clearance and if she doesn't get it, to call us back.

## 2017-03-09 ENCOUNTER — Telehealth: Payer: Self-pay | Admitting: Cardiology

## 2017-03-09 NOTE — Telephone Encounter (Signed)
Ortho office has been made aware.   Fax was never received, but aware that patient is currently NOT cleared and will need OV.   Will get patient scheduled for pre-op.

## 2017-03-09 NOTE — Telephone Encounter (Addendum)
Edited to add: received call from [facility] office to clarify pre-op clearance. Nurse found that our last encounter with [name] Dickerson was actually 03/2016, not 03/2017. Therefore the clearance below is null and void - since patient has not been seen in a year, needs office visit before providing clearance. Hayley, can you please call the orthopedic office listed to make them aware that previous documents sent are no longer valid and patient is NOT cleared for surgery until seen in our office. THANK YOU!   Yoko Mcgahee PA-C

## 2017-03-09 NOTE — Telephone Encounter (Signed)
Spoke to [facility] chart review, noted last OV 03/2016 not 2019.   Spoke to PA who reviewed and advised patient needs OV.  See telephone note.      [name] Dickerson is aware and scheduler will call to schedule appt.

## 2017-03-09 NOTE — Telephone Encounter (Signed)
New Message   Mabeline Caras Dr. [place] [facility] nurse Romie Minus is calling back about surgical clearance. Per notes the information was faxed but they are staying that it has not been received. Please call.

## 2017-03-10 DIAGNOSIS — M9903 Segmental and somatic dysfunction of lumbar region: Secondary | ICD-10-CM | POA: Diagnosis not present

## 2017-03-10 DIAGNOSIS — M4306 Spondylolysis, lumbar region: Secondary | ICD-10-CM | POA: Diagnosis not present

## 2017-03-10 DIAGNOSIS — M4303 Spondylolysis, cervicothoracic region: Secondary | ICD-10-CM | POA: Diagnosis not present

## 2017-03-10 DIAGNOSIS — M9905 Segmental and somatic dysfunction of pelvic region: Secondary | ICD-10-CM | POA: Diagnosis not present

## 2017-03-10 DIAGNOSIS — M9901 Segmental and somatic dysfunction of cervical region: Secondary | ICD-10-CM | POA: Diagnosis not present

## 2017-03-10 DIAGNOSIS — M5431 Sciatica, right side: Secondary | ICD-10-CM | POA: Diagnosis not present

## 2017-03-18 DIAGNOSIS — J449 Chronic obstructive pulmonary disease, unspecified: Secondary | ICD-10-CM | POA: Diagnosis not present

## 2017-03-18 DIAGNOSIS — Z139 Encounter for screening, unspecified: Secondary | ICD-10-CM | POA: Diagnosis not present

## 2017-03-18 DIAGNOSIS — Z6830 Body mass index (BMI) 30.0-30.9, adult: Secondary | ICD-10-CM | POA: Diagnosis not present

## 2017-03-18 DIAGNOSIS — Z7189 Other specified counseling: Secondary | ICD-10-CM | POA: Diagnosis not present

## 2017-03-26 DIAGNOSIS — M4306 Spondylolysis, lumbar region: Secondary | ICD-10-CM | POA: Diagnosis not present

## 2017-03-26 DIAGNOSIS — M4303 Spondylolysis, cervicothoracic region: Secondary | ICD-10-CM | POA: Diagnosis not present

## 2017-03-26 DIAGNOSIS — M9901 Segmental and somatic dysfunction of cervical region: Secondary | ICD-10-CM | POA: Diagnosis not present

## 2017-03-26 DIAGNOSIS — M9905 Segmental and somatic dysfunction of pelvic region: Secondary | ICD-10-CM | POA: Diagnosis not present

## 2017-03-26 DIAGNOSIS — M9903 Segmental and somatic dysfunction of lumbar region: Secondary | ICD-10-CM | POA: Diagnosis not present

## 2017-03-26 DIAGNOSIS — M5431 Sciatica, right side: Secondary | ICD-10-CM | POA: Diagnosis not present

## 2017-04-07 ENCOUNTER — Encounter: Payer: Self-pay | Admitting: Cardiology

## 2017-04-07 ENCOUNTER — Ambulatory Visit (INDEPENDENT_AMBULATORY_CARE_PROVIDER_SITE_OTHER): Payer: Medicare Other | Admitting: Cardiology

## 2017-04-07 VITALS — BP 114/70 | HR 68 | Ht 68.0 in | Wt 198.0 lb

## 2017-04-07 DIAGNOSIS — I2119 ST elevation (STEMI) myocardial infarction involving other coronary artery of inferior wall: Secondary | ICD-10-CM | POA: Diagnosis not present

## 2017-04-07 DIAGNOSIS — Z01810 Encounter for preprocedural cardiovascular examination: Secondary | ICD-10-CM | POA: Diagnosis not present

## 2017-04-07 DIAGNOSIS — I2510 Atherosclerotic heart disease of native coronary artery without angina pectoris: Secondary | ICD-10-CM

## 2017-04-07 DIAGNOSIS — E785 Hyperlipidemia, unspecified: Secondary | ICD-10-CM | POA: Diagnosis not present

## 2017-04-07 DIAGNOSIS — F17210 Nicotine dependence, cigarettes, uncomplicated: Secondary | ICD-10-CM | POA: Diagnosis not present

## 2017-04-07 DIAGNOSIS — I10 Essential (primary) hypertension: Secondary | ICD-10-CM

## 2017-04-07 HISTORY — DX: Encounter for preprocedural cardiovascular examination: Z01.810

## 2017-04-07 MED ORDER — CLOPIDOGREL BISULFATE 75 MG PO TABS: 75.0000 mg | ORAL_TABLET | Freq: Every day | ORAL | 3 refills | Status: DC

## 2017-04-07 NOTE — Patient Instructions (Signed)
NO MEDICATION CHANGES   OKAY FOR HAND SURGERY -- HOLD PLAVIX FOR 5 DAYS PRIOR TO SURGERY , RESTART AFTER SURGERY PER SURGEON.  CONTINUE WITH METOPROLOL BEFORE AND DURING AND AFTER SURGERY      Your physician wants you to follow-up in [place]. You will receive a reminder letter in the mail two months in advance. If you don't receive a letter, please call our office to schedule the follow-up appointment.   If you need a refill on your cardiac medications before your next appointment, please call your pharmacy.

## 2017-04-07 NOTE — Assessment & Plan Note (Signed)
Initial MI back in 1995 with follow-up MI in 2003. He has now had a follow-up cath in 2012 showing occluded mid RCA with right-to-left and left-to-right collaterals but otherwise normal left coronary system.  This is confirmed by Myoview showing moderate sized severe perfusion defect in the inferior wall as well as an echocardiogram showing inferior-inferior septal apical hypokinesis. ->  In the absence of symptoms, I would not pursue further evaluation.  Follow-up stress test now would simply be read as abnormal and delay procedures.  No active heart failure symptoms or recurrent anginal symptoms.  He is probably safe for now would be occluded RCA as he is no longer having any anginal symptoms. Remains on beta-blocker, ACE inhibitor and Plavix.

## 2017-04-07 NOTE — Assessment & Plan Note (Signed)
Working on it.  Anywhere from half pack up to a full pack a day depending on the stress level.  He is trying to quit, but is not overly concerned.  We talked about it, but he was not interested in options to quit.

## 2017-04-07 NOTE — Assessment & Plan Note (Signed)
Known chronic occlusion of prior stent to RCA.  This was confirmed by Myoview in 2013 showing inferolateral infarct with no ischemia.  No further anginal symptoms.  No heart failure symptoms.  On Plavix for maintenance along with stable dose of beta-blocker and ACE inhibitor. Has been intolerant of statins.  In the absence of ongoing symptoms, I do not see any pursue ischemic evaluation.

## 2017-04-07 NOTE — Assessment & Plan Note (Signed)
Remains statin intolerant.  Unfortunately, I do not have his labs checked by PCP.  We will try to get labs from his PCP.  If it does look like his lipids are not well controlled, may want to consider referral to [facility] to discuss possible PCSK9 inhibitor.

## 2017-04-07 NOTE — Assessment & Plan Note (Addendum)
[name] Dickerson is doing very well with no active angina or heart failure symptoms.  He has never had any TIA or stroke symptoms.  Has normal renal function and is nondiabetic.  He has planned wrist surgeries which are both low risk.  Based on ACC-AHA guidelines, he would be considered a low risk patient for low risk surgery.  He is on a beta-blocker which further reduces his risk.  Recommendations would be to proceed to the OR without any further cardiac, as it would not change our treatment plan. HOLD PLAVIX FOR 5 DAYS PRIOR TO SURGERY , RESTART AFTER SURGERY PER SURGEON.  CONTINUE WITH METOPROLOL BEFORE AND DURING AND AFTER SURGERY   See addendum below for detailed evaluation.

## 2017-04-07 NOTE — Progress Notes (Signed)
PCP: Suzan Garibaldi, FNP; Dan Europe  ([place])  Clinic Note: Chief Complaint  Patient presents with  . Pre-op Exam  . Follow-up  . Coronary Artery Disease    HPI: [name] Dickerson is a 72 y.o. male with a PMH below who presents today for annual follow-up  For coronary artery disease and apparently for preoperative risk assessment as well.    He is pending surgery by [facility] History:  Inferior STEMI in 1995 -- PTCA of RCA  04/2001: Recurrent angina - overlapping BMS x 2 mRCA  09/2001: Cutting Balloon PTCA for ISR  2012: 100% mRCA occlusion, R-R bridging & L-R collaterals  2013: Myoview with Large Inferior "Infarct" w/ mild per-infarct ischemia  12/2012: Echo: EF 50-55%, distal Inferior HK  HOANG PETTINGILL was last seen  in January 2018 by Rosaria Ferries, PA (I last saw him in January 2017) -this was a visit for preoperative assessment for laparoscopic cholecystectomy.  Averi noted that he routinely was able to walk steps without stopping, [place].  Never gets any chest pain or dyspnea.  Was still smoking but "trying to cut down and hopefully quit ".  No other cardiac symptoms.  Felt to be LOW RISK for surgery. His surgical experience was somewhat complicated in the fact that the initial surgeon did not follow through with the actual cholecystectomy.  He saw a gangrenous gallbladder, and according to the patient closed him up and sent home.  He then sought out another surgeon who performed the surgery safely.  Recent Hospitalizations: None recently  Studies Personally Reviewed - (if available, images/films reviewed: From Epic Chart or Care Everywhere)  None  Interval History: [name] Dickerson presents here today for basically an annual follow-up, but also for preop assessment for carpal tunnel surgery.  As is usual, he continues to be fully active working on the forearm, chasing cows etc.  He has not fully quit smoking, but is still  working it.  He still gets a little bit of cough from it but not necessarily any significant dyspnea.  He denies any chest tightness or pressure with rest or exertion.  Only exertional dyspnea if he really exerts.  No PND, orthopnea or edema.  No rapid irregular heartbeat/palpitations.  No lightheadedness, dizziness, weakness, syncope/near syncope, or TIA/amaurosis fugax symptoms. No claudication.  ROS: A comprehensive was performed. Review of Systems  Constitutional: Negative for chills, fever and malaise/fatigue.  HENT: Negative for congestion and nosebleeds.   Respiratory: Positive for cough (Morning cough). Negative for sputum production, shortness of breath and wheezing.   Gastrointestinal: Negative for abdominal pain, blood in stool, heartburn, melena, nausea and vomiting.  Genitourinary: Negative for hematuria.  Musculoskeletal: Positive for joint pain (Mostly in his hands and wrists).  Neurological: Positive for tingling (His fingers). Negative for dizziness.  Endo/Heme/Allergies: Negative for environmental allergies.  Psychiatric/Behavioral: Negative.   All other systems reviewed and are negative.  Normal arthritis pains.  I have reviewed and (if needed) personally updated the patient's problem list, medications, allergies, past medical and surgical history, social and family history.   Past Medical History:  Diagnosis Date  . CAD S/P percutaneous coronary angioplasty 1995, 2003   Inferior STEMI-PTCA RCA; 2003: Overlapping MultiLink Zeta BMS -RCA (4.0 x 23, 4.0 x 15); 09/2001 Cutting Balloon PTCA of stents for ISR.;; Myoview August 2013 - Mod-severe sized fixed INFERIOR perfusion defect => likely infarct with mild peri-infarct ischemia in the apical inferior wall, EF 55%  with distal inferior hypokinesis  . Essential hypertension   . Hyperlipidemia LDL goal <70   . Hypothyroidism   . Low testosterone   . Moderate cigarette smoker (10-19 per day)    Was down to 5 cigarettes a  day.  . ST elevation myocardial infarction (STEMI) of inferior wall, subsequent episode of care [facility]) 1995   PTCA of RCA; large inferior scar noted on Myoview. Mid inferior and inferoseptal hypokinesis on echocardiogram November 2014. EF 50-55%.  . Stenosis of coronary stent 2012   Mid RCA Occlusion with Right-To-Left and Left-To-Right Collaterals.; Relatively normal LAD, ramus intermedius and circumflex-none.    Past Surgical History:  Procedure Laterality Date  . CARDIAC CATHETERIZATION  March 2012   Mid occlusion of RCA with Right to Left and Left to Right collaterals; LAD, ramus and circumflex/OM widely patent  . CORONARY ANGIOPLASTY  1995   Inferior STEMI-PTCA of RCA  . CORONARY STENT PLACEMENT  2003   MultiLink Zeta BMS 4.0 mm x 23 mm, 4.0 mm x 15 mm --> Cutting Balloon PTCA for ISR several months later.  Marland Kitchen LexiScan Myoview  August 2013   Moderate-severe sized INFERIOR perfusion defect with no evidence of ischemia. EF 55% with distal inferior HK. Large sized Inferior and Inferolateral Infarct with perhaps mild apical inferior ischemia.  . TRANSTHORACIC ECHOCARDIOGRAM  01/09/2013   EF 50-55%; normal LV size and function. Probable mild HJ of the mid inferior and inferoseptal wall.; No valvular lesions.    Current Meds  Medication Sig  . Cinnamon 500 MG TABS Take 2,000 mg by mouth 2 (two) times daily.  . clopidogrel (PLAVIX) 75 MG tablet Take 1 tablet (75 mg total) by mouth daily with breakfast.  . levothyroxine (SYNTHROID, LEVOTHROID) 200 MCG tablet Take 200 mcg by mouth daily before breakfast.  . levothyroxine (SYNTHROID, LEVOTHROID) 25 MCG tablet Take 25 mcg by mouth daily before breakfast.  . lisinopril (PRINIVIL,ZESTRIL) 30 MG tablet Take 30 mg by mouth daily.  . metoprolol (LOPRESSOR) 50 MG tablet Take 50 mg by mouth 2 (two) times daily.  . Niacin (NICOTINIC ACID ER PO) Take 500 mg by mouth daily.  . nitroGLYCERIN (NITROSTAT) 0.4 MG SL tablet Place 1 tablet (0.4 mg total) under  the tongue every 5 (five) minutes as needed for chest pain.  . [DISCONTINUED] clopidogrel (PLAVIX) 75 MG tablet Take 75 mg by mouth daily with breakfast.    Allergies  Allergen Reactions  . Statins     Soreness with Crestor    Social History   Tobacco Use  . Smoking status: Current Every Day Smoker    Packs/day: 1.00    Years: 40.00    Pack years: 40.00    Types: Cigarettes  . Smokeless tobacco: Never Used  Substance Use Topics  . Alcohol use: No  . Drug use: No   Social History   Social History Narrative   Married father of 3, grandfather 54. Works out occasionally on the treadmill. Coping to try get back into some exercise now. Unfortunately back up to smoking one pack per day. Does not drink alcohol.      Date from from 83 or 6 cigarettes a day. He's tried electronic cigarettes and down without affect.    family history is not on file.  Wt Readings from Last 3 Encounters:  04/07/17 198 lb (89.8 kg)  03/05/16 191 lb 6.4 oz (86.8 kg)  03/22/15 201 lb (91.2 kg)    PHYSICAL EXAM BP 114/70   Pulse 68  Ht 5\' 8"  (1.727 m)   Wt 198 lb (89.8 kg)   BMI 30.11 kg/m  Physical Exam  Constitutional: He is oriented to person, place, and time. He appears well-developed and well-nourished. No distress.  Well-groomed.  Moderate truncal obesity  HENT:  Head: Normocephalic and atraumatic.  Eyes: EOM are normal.  Neck: Normal range of motion. Neck supple. No hepatojugular reflux and no JVD present. Carotid bruit is not present.  Cardiovascular: Normal rate, regular rhythm, normal heart sounds and normal pulses.  No extrasystoles are present. PMI is not displaced. Exam reveals no gallop and no friction rub.  No murmur heard. Pulmonary/Chest: Effort normal and breath sounds normal. No respiratory distress. He has no wheezes. He has no rales.  Abdominal: Soft. Bowel sounds are normal. He exhibits no distension. There is no tenderness. There is no rebound.  Truncal obesity    Musculoskeletal: Normal range of motion. He exhibits no edema.  Neurological: He is alert and oriented to person, place, and time.  Skin: Skin is warm and dry. No erythema.  Psychiatric: He has a normal mood and affect. His behavior is normal. Judgment and thought content normal.  Nursing note and vitals reviewed.   General appearance: alert& oriented x3, cooperative, appears stated age, no distress and borderline obese; well groomed/well-nourished Neck: no adenopathy, no carotid bruit, no JVD and supple, symmetrical, trachea midline Lungs: clear to auscultation bilaterally, normal percussion bilaterally and Nonlabored, good air movement Heart: regular rate and rhythm, S1, S2 normal, no murmur, click, rub or gallop and normal apical impulse Abdomen: soft, non-tender; bowel sounds normal; no masses, no organomegaly and truncal obesity Extremities: extremities normal, atraumatic, no cyanosis or edema, no edema, redness or tenderness in the calves or thighs and no ulcers, gangrene or trophic changes Pulses: 2+ and symmetric Neurologic: Grossly normal   Adult ECG Report  Rate: Sinus rhythm, 68;  Rhythm: normal sinus rhythm and The computer read says PVCs, but these are considerations.;   Narrative Interpretation: Stable/normal EKG   Other studies Reviewed: Additional studies/ records that were reviewed today include:  Recent Labs:    PCP checked ~ a month ago.  Not available   ASSESSMENT / PLAN: Problem List Items Addressed This Visit    Coronary artery disease, occlusive (Chronic)    Known chronic occlusion of prior stent to RCA.  This was confirmed by Myoview in 2013 showing inferolateral infarct with no ischemia.  No further anginal symptoms.  No heart failure symptoms.  On Plavix for maintenance along with stable dose of beta-blocker and ACE inhibitor. Has been intolerant of statins.  In the absence of ongoing symptoms, I do not see any pursue ischemic evaluation.       Relevant Orders   EKG 12-Lead   Essential hypertension (Chronic)    Well-controlled on current meds.  No change      Hyperlipidemia LDL goal <70 (Chronic)    Remains statin intolerant.  Unfortunately, I do not have his labs checked by PCP.  We will try to get labs from his PCP.  If it does look like his lipids are not well controlled, may want to consider referral to [facility] to discuss possible PCSK9 inhibitor.      Moderate cigarette smoker (10-19 per day) (Chronic)    Working on it.  Anywhere from half pack up to a full pack a day depending on the stress level.  He is trying to quit, but is not overly concerned.  We talked about  it, but he was not interested in options to quit.      Relevant Orders   EKG 12-Lead   Preop cardiovascular exam - Primary    Jadier is doing very well with no active angina or heart failure symptoms.  He has never had any TIA or stroke symptoms.  Has normal renal function and is nondiabetic.  He has planned wrist surgeries which are both low risk.  Based on ACC-AHA guidelines, he would be considered a low risk patient for low risk surgery.  He is on a beta-blocker which further reduces his risk.  Recommendations would be to proceed to the OR without any further cardiac, as it would not change our treatment plan. HOLD PLAVIX FOR 5 DAYS PRIOR TO SURGERY , RESTART AFTER SURGERY PER SURGEON.  CONTINUE WITH METOPROLOL BEFORE AND DURING AND AFTER SURGERY   See addendum below for detailed evaluation.      Relevant Orders   EKG 12-Lead   ST elevation myocardial infarction (STEMI) of inferior wall, subsequent episode of care [facility]) (Chronic)    Initial MI back in 1995 with follow-up MI in 2003. He has now had a follow-up cath in 2012 showing occluded mid RCA with right-to-left and left-to-right collaterals but otherwise normal left coronary system.  This is confirmed by Myoview showing moderate sized severe perfusion defect in the inferior wall as well as  an echocardiogram showing inferior-inferior septal apical hypokinesis. ->  In the absence of symptoms, I would not pursue further evaluation.  Follow-up stress test now would simply be read as abnormal and delay procedures.  No active heart failure symptoms or recurrent anginal symptoms.  He is probably safe for now would be occluded RCA as he is no longer having any anginal symptoms. Remains on beta-blocker, ACE inhibitor and Plavix.      Relevant Orders   EKG 12-Lead      Current medicines are reviewed at length with the patient today. (+/- concerns) none The following changes have been made:None  Patient Instructions  NO MEDICATION CHANGES   OKAY FOR HAND SURGERY -- HOLD PLAVIX FOR 5 DAYS PRIOR TO SURGERY , RESTART AFTER SURGERY PER SURGEON.  CONTINUE WITH METOPROLOL BEFORE AND DURING AND AFTER SURGERY      Your physician wants you to follow-up in [place]. You will receive a reminder letter in the mail two months in advance. If you don't receive a letter, please call our office to schedule the follow-up appointment.   If you need a refill on your cardiac medications before your next appointment, please call your pharmacy.    Studies Ordered:   Orders Placed This Encounter  Procedures  . EKG 12-Lead      Glenetta Hew, M.D., M.S. Interventional Cardiologist   Pager # [phone] Phone # [phone] [address]. [place], [place] [address]   Thank you for choosing [facility] at [facility]!!     ADDENDUM:  PREOPERATIVE CARDIAC RISK ASSESSMENT   Revised Cardiac Risk Index:  High Risk Surgery: no;  Defined as Intraperitoneal, intrathoracic or suprainguinal vascular  Active CAD: no;   CHF: no; has preserved EF and no heart failure symptoms  Cerebrovascular Disease: no;   Diabetes: no; On Insulin: no  CKD (Cr >~ 2): no;   Total: 0 Estimated Risk of Adverse Outcome: LOW RISK Estimated Risk of MI, PE, VF/VT (Cardiac  Arrest), Complete Heart Block: 1 %   ACC/AHA Guidelines for "Clearance":  Step 1 - Need for Emergency Surgery:  No:   If Yes - go straight to OR with perioperative surveillance  Step 2 - Active Cardiac Conditions (Unstable Angina, Decompensated HF, Significant  Arrhytmias - Complete HB, Mobitz II, Symptomatic VT or SVT, Severe Aortic Stenosis - mean gradient > 40 mmHg, Valve area < 1.0 cm2):   No:   If Yes - Evaluate & Treat per ACC/AHA Guidelines  Step 3 -  Low Risk Surgery: No:   If Yes --> proceed to OR  If No --> Step 4  Step 4 - Functional Capacity >= 4 METS without symptoms: Yes  If Yes --> proceed to OR  If No --> Step 5   Step 5 --  Clinical Risk Factors (CRF)   3 or more: No: 0  If Yes -- assess Surgical Risk, --   (High Risk Non-cardiac), Intraabdominal or thoracic vascular surgery consider testing if it will change management.  Intermediate Risk: Proceed to OR with HR control, or consider testing if it will change management  No CRFs: Yes  If Yes --> Proceed to OR

## 2017-04-07 NOTE — Assessment & Plan Note (Signed)
Well-controlled on current meds.  No change 

## 2017-04-14 DIAGNOSIS — M9903 Segmental and somatic dysfunction of lumbar region: Secondary | ICD-10-CM | POA: Diagnosis not present

## 2017-04-14 DIAGNOSIS — M4306 Spondylolysis, lumbar region: Secondary | ICD-10-CM | POA: Diagnosis not present

## 2017-04-14 DIAGNOSIS — M5431 Sciatica, right side: Secondary | ICD-10-CM | POA: Diagnosis not present

## 2017-04-14 DIAGNOSIS — M9902 Segmental and somatic dysfunction of thoracic region: Secondary | ICD-10-CM | POA: Diagnosis not present

## 2017-04-14 DIAGNOSIS — M4304 Spondylolysis, thoracic region: Secondary | ICD-10-CM | POA: Diagnosis not present

## 2017-04-14 DIAGNOSIS — M9905 Segmental and somatic dysfunction of pelvic region: Secondary | ICD-10-CM | POA: Diagnosis not present

## 2017-04-16 DIAGNOSIS — G5600 Carpal tunnel syndrome, unspecified upper limb: Secondary | ICD-10-CM | POA: Diagnosis not present

## 2017-04-20 DIAGNOSIS — M5136 Other intervertebral disc degeneration, lumbar region: Secondary | ICD-10-CM | POA: Diagnosis not present

## 2017-04-20 DIAGNOSIS — Z7902 Long term (current) use of antithrombotics/antiplatelets: Secondary | ICD-10-CM | POA: Diagnosis not present

## 2017-04-20 DIAGNOSIS — E079 Disorder of thyroid, unspecified: Secondary | ICD-10-CM | POA: Diagnosis not present

## 2017-04-20 DIAGNOSIS — G5602 Carpal tunnel syndrome, left upper limb: Secondary | ICD-10-CM | POA: Diagnosis not present

## 2017-04-20 DIAGNOSIS — F17200 Nicotine dependence, unspecified, uncomplicated: Secondary | ICD-10-CM | POA: Diagnosis not present

## 2017-04-20 DIAGNOSIS — M1990 Unspecified osteoarthritis, unspecified site: Secondary | ICD-10-CM | POA: Diagnosis not present

## 2017-04-20 DIAGNOSIS — Z79899 Other long term (current) drug therapy: Secondary | ICD-10-CM | POA: Diagnosis not present

## 2017-04-20 DIAGNOSIS — J449 Chronic obstructive pulmonary disease, unspecified: Secondary | ICD-10-CM | POA: Diagnosis not present

## 2017-04-20 DIAGNOSIS — F17210 Nicotine dependence, cigarettes, uncomplicated: Secondary | ICD-10-CM | POA: Diagnosis not present

## 2017-04-20 DIAGNOSIS — I10 Essential (primary) hypertension: Secondary | ICD-10-CM | POA: Diagnosis not present

## 2017-04-20 DIAGNOSIS — F418 Other specified anxiety disorders: Secondary | ICD-10-CM | POA: Diagnosis not present

## 2017-04-20 DIAGNOSIS — E7800 Pure hypercholesterolemia, unspecified: Secondary | ICD-10-CM | POA: Diagnosis not present

## 2017-04-20 DIAGNOSIS — I2510 Atherosclerotic heart disease of native coronary artery without angina pectoris: Secondary | ICD-10-CM | POA: Diagnosis not present

## 2017-04-20 DIAGNOSIS — Z955 Presence of coronary angioplasty implant and graft: Secondary | ICD-10-CM | POA: Diagnosis not present

## 2017-04-23 DIAGNOSIS — M6281 Muscle weakness (generalized): Secondary | ICD-10-CM | POA: Diagnosis not present

## 2017-04-23 DIAGNOSIS — M2560 Stiffness of unspecified joint, not elsewhere classified: Secondary | ICD-10-CM | POA: Diagnosis not present

## 2017-04-23 DIAGNOSIS — M79642 Pain in left hand: Secondary | ICD-10-CM | POA: Diagnosis not present

## 2017-04-27 DIAGNOSIS — M6281 Muscle weakness (generalized): Secondary | ICD-10-CM | POA: Diagnosis not present

## 2017-04-27 DIAGNOSIS — M79642 Pain in left hand: Secondary | ICD-10-CM | POA: Diagnosis not present

## 2017-04-27 DIAGNOSIS — M2560 Stiffness of unspecified joint, not elsewhere classified: Secondary | ICD-10-CM | POA: Diagnosis not present

## 2017-04-30 DIAGNOSIS — M2560 Stiffness of unspecified joint, not elsewhere classified: Secondary | ICD-10-CM | POA: Diagnosis not present

## 2017-04-30 DIAGNOSIS — M79642 Pain in left hand: Secondary | ICD-10-CM | POA: Diagnosis not present

## 2017-04-30 DIAGNOSIS — M6281 Muscle weakness (generalized): Secondary | ICD-10-CM | POA: Diagnosis not present

## 2017-05-10 DIAGNOSIS — N401 Enlarged prostate with lower urinary tract symptoms: Secondary | ICD-10-CM | POA: Diagnosis not present

## 2017-05-10 DIAGNOSIS — M4727 Other spondylosis with radiculopathy, lumbosacral region: Secondary | ICD-10-CM | POA: Diagnosis not present

## 2017-05-10 DIAGNOSIS — M9901 Segmental and somatic dysfunction of cervical region: Secondary | ICD-10-CM | POA: Diagnosis not present

## 2017-05-10 DIAGNOSIS — M4302 Spondylolysis, cervical region: Secondary | ICD-10-CM | POA: Diagnosis not present

## 2017-05-10 DIAGNOSIS — M5388 Other specified dorsopathies, sacral and sacrococcygeal region: Secondary | ICD-10-CM | POA: Diagnosis not present

## 2017-05-10 DIAGNOSIS — M9903 Segmental and somatic dysfunction of lumbar region: Secondary | ICD-10-CM | POA: Diagnosis not present

## 2017-05-10 DIAGNOSIS — N3020 Other chronic cystitis without hematuria: Secondary | ICD-10-CM | POA: Diagnosis not present

## 2017-05-10 DIAGNOSIS — M9904 Segmental and somatic dysfunction of sacral region: Secondary | ICD-10-CM | POA: Diagnosis not present

## 2017-05-10 DIAGNOSIS — N538 Other male sexual dysfunction: Secondary | ICD-10-CM | POA: Diagnosis not present

## 2017-05-10 DIAGNOSIS — E291 Testicular hypofunction: Secondary | ICD-10-CM | POA: Diagnosis not present

## 2017-05-11 DIAGNOSIS — E039 Hypothyroidism, unspecified: Secondary | ICD-10-CM | POA: Diagnosis not present

## 2017-05-11 DIAGNOSIS — N419 Inflammatory disease of prostate, unspecified: Secondary | ICD-10-CM | POA: Diagnosis not present

## 2017-05-11 DIAGNOSIS — F17200 Nicotine dependence, unspecified, uncomplicated: Secondary | ICD-10-CM | POA: Diagnosis not present

## 2017-05-11 DIAGNOSIS — E7800 Pure hypercholesterolemia, unspecified: Secondary | ICD-10-CM | POA: Diagnosis not present

## 2017-05-11 DIAGNOSIS — I2510 Atherosclerotic heart disease of native coronary artery without angina pectoris: Secondary | ICD-10-CM | POA: Diagnosis not present

## 2017-05-11 DIAGNOSIS — E119 Type 2 diabetes mellitus without complications: Secondary | ICD-10-CM | POA: Diagnosis not present

## 2017-05-11 DIAGNOSIS — I10 Essential (primary) hypertension: Secondary | ICD-10-CM | POA: Diagnosis not present

## 2017-05-17 DIAGNOSIS — N529 Male erectile dysfunction, unspecified: Secondary | ICD-10-CM | POA: Diagnosis not present

## 2017-05-17 DIAGNOSIS — E039 Hypothyroidism, unspecified: Secondary | ICD-10-CM | POA: Diagnosis not present

## 2017-05-17 DIAGNOSIS — E118 Type 2 diabetes mellitus with unspecified complications: Secondary | ICD-10-CM | POA: Diagnosis not present

## 2017-05-24 DIAGNOSIS — G5603 Carpal tunnel syndrome, bilateral upper limbs: Secondary | ICD-10-CM | POA: Diagnosis not present

## 2017-05-24 DIAGNOSIS — M65331 Trigger finger, right middle finger: Secondary | ICD-10-CM | POA: Diagnosis not present

## 2017-05-24 DIAGNOSIS — T8149XA Infection following a procedure, other surgical site, initial encounter: Secondary | ICD-10-CM | POA: Diagnosis not present

## 2017-05-28 DIAGNOSIS — E0590 Thyrotoxicosis, unspecified without thyrotoxic crisis or storm: Secondary | ICD-10-CM | POA: Diagnosis not present

## 2017-05-28 DIAGNOSIS — R946 Abnormal results of thyroid function studies: Secondary | ICD-10-CM | POA: Diagnosis not present

## 2017-05-28 DIAGNOSIS — N179 Acute kidney failure, unspecified: Secondary | ICD-10-CM | POA: Diagnosis not present

## 2017-05-28 DIAGNOSIS — N281 Cyst of kidney, acquired: Secondary | ICD-10-CM | POA: Diagnosis not present

## 2017-05-31 DIAGNOSIS — N529 Male erectile dysfunction, unspecified: Secondary | ICD-10-CM | POA: Diagnosis not present

## 2017-05-31 DIAGNOSIS — M25512 Pain in left shoulder: Secondary | ICD-10-CM | POA: Diagnosis not present

## 2017-05-31 DIAGNOSIS — M542 Cervicalgia: Secondary | ICD-10-CM | POA: Diagnosis not present

## 2017-05-31 DIAGNOSIS — M25511 Pain in right shoulder: Secondary | ICD-10-CM | POA: Diagnosis not present

## 2017-06-09 DIAGNOSIS — M4727 Other spondylosis with radiculopathy, lumbosacral region: Secondary | ICD-10-CM | POA: Diagnosis not present

## 2017-06-09 DIAGNOSIS — M5388 Other specified dorsopathies, sacral and sacrococcygeal region: Secondary | ICD-10-CM | POA: Diagnosis not present

## 2017-06-09 DIAGNOSIS — M4302 Spondylolysis, cervical region: Secondary | ICD-10-CM | POA: Diagnosis not present

## 2017-06-09 DIAGNOSIS — M9904 Segmental and somatic dysfunction of sacral region: Secondary | ICD-10-CM | POA: Diagnosis not present

## 2017-06-09 DIAGNOSIS — M9903 Segmental and somatic dysfunction of lumbar region: Secondary | ICD-10-CM | POA: Diagnosis not present

## 2017-06-09 DIAGNOSIS — M9901 Segmental and somatic dysfunction of cervical region: Secondary | ICD-10-CM | POA: Diagnosis not present

## 2017-06-17 DIAGNOSIS — E119 Type 2 diabetes mellitus without complications: Secondary | ICD-10-CM | POA: Diagnosis not present

## 2017-06-17 DIAGNOSIS — N281 Cyst of kidney, acquired: Secondary | ICD-10-CM | POA: Diagnosis not present

## 2017-06-17 DIAGNOSIS — I701 Atherosclerosis of renal artery: Secondary | ICD-10-CM | POA: Diagnosis not present

## 2017-06-17 DIAGNOSIS — E039 Hypothyroidism, unspecified: Secondary | ICD-10-CM | POA: Diagnosis not present

## 2017-06-17 DIAGNOSIS — N529 Male erectile dysfunction, unspecified: Secondary | ICD-10-CM | POA: Diagnosis not present

## 2017-06-18 DIAGNOSIS — M9903 Segmental and somatic dysfunction of lumbar region: Secondary | ICD-10-CM | POA: Diagnosis not present

## 2017-06-18 DIAGNOSIS — M4727 Other spondylosis with radiculopathy, lumbosacral region: Secondary | ICD-10-CM | POA: Diagnosis not present

## 2017-06-18 DIAGNOSIS — M4302 Spondylolysis, cervical region: Secondary | ICD-10-CM | POA: Diagnosis not present

## 2017-06-18 DIAGNOSIS — M5388 Other specified dorsopathies, sacral and sacrococcygeal region: Secondary | ICD-10-CM | POA: Diagnosis not present

## 2017-06-18 DIAGNOSIS — M9904 Segmental and somatic dysfunction of sacral region: Secondary | ICD-10-CM | POA: Diagnosis not present

## 2017-06-18 DIAGNOSIS — M9901 Segmental and somatic dysfunction of cervical region: Secondary | ICD-10-CM | POA: Diagnosis not present

## 2017-06-28 DIAGNOSIS — E039 Hypothyroidism, unspecified: Secondary | ICD-10-CM | POA: Diagnosis not present

## 2017-06-28 DIAGNOSIS — I10 Essential (primary) hypertension: Secondary | ICD-10-CM | POA: Diagnosis not present

## 2017-06-28 DIAGNOSIS — N529 Male erectile dysfunction, unspecified: Secondary | ICD-10-CM | POA: Diagnosis not present

## 2017-07-20 DIAGNOSIS — M47812 Spondylosis without myelopathy or radiculopathy, cervical region: Secondary | ICD-10-CM | POA: Diagnosis not present

## 2017-07-20 DIAGNOSIS — M9901 Segmental and somatic dysfunction of cervical region: Secondary | ICD-10-CM | POA: Diagnosis not present

## 2017-07-20 DIAGNOSIS — M9904 Segmental and somatic dysfunction of sacral region: Secondary | ICD-10-CM | POA: Diagnosis not present

## 2017-07-20 DIAGNOSIS — M4726 Other spondylosis with radiculopathy, lumbar region: Secondary | ICD-10-CM | POA: Diagnosis not present

## 2017-07-20 DIAGNOSIS — M9903 Segmental and somatic dysfunction of lumbar region: Secondary | ICD-10-CM | POA: Diagnosis not present

## 2017-07-20 DIAGNOSIS — M5388 Other specified dorsopathies, sacral and sacrococcygeal region: Secondary | ICD-10-CM | POA: Diagnosis not present

## 2017-08-19 DIAGNOSIS — M47812 Spondylosis without myelopathy or radiculopathy, cervical region: Secondary | ICD-10-CM | POA: Diagnosis not present

## 2017-08-19 DIAGNOSIS — M9903 Segmental and somatic dysfunction of lumbar region: Secondary | ICD-10-CM | POA: Diagnosis not present

## 2017-08-19 DIAGNOSIS — M5388 Other specified dorsopathies, sacral and sacrococcygeal region: Secondary | ICD-10-CM | POA: Diagnosis not present

## 2017-08-19 DIAGNOSIS — M9901 Segmental and somatic dysfunction of cervical region: Secondary | ICD-10-CM | POA: Diagnosis not present

## 2017-08-19 DIAGNOSIS — M4726 Other spondylosis with radiculopathy, lumbar region: Secondary | ICD-10-CM | POA: Diagnosis not present

## 2017-08-19 DIAGNOSIS — M9904 Segmental and somatic dysfunction of sacral region: Secondary | ICD-10-CM | POA: Diagnosis not present

## 2017-09-06 DIAGNOSIS — N419 Inflammatory disease of prostate, unspecified: Secondary | ICD-10-CM | POA: Diagnosis not present

## 2017-09-06 DIAGNOSIS — I10 Essential (primary) hypertension: Secondary | ICD-10-CM | POA: Diagnosis not present

## 2017-09-06 DIAGNOSIS — F17200 Nicotine dependence, unspecified, uncomplicated: Secondary | ICD-10-CM | POA: Diagnosis not present

## 2017-09-06 DIAGNOSIS — E7800 Pure hypercholesterolemia, unspecified: Secondary | ICD-10-CM | POA: Diagnosis not present

## 2017-09-06 DIAGNOSIS — E039 Hypothyroidism, unspecified: Secondary | ICD-10-CM | POA: Diagnosis not present

## 2017-09-06 DIAGNOSIS — E119 Type 2 diabetes mellitus without complications: Secondary | ICD-10-CM | POA: Diagnosis not present

## 2017-09-06 DIAGNOSIS — Z716 Tobacco abuse counseling: Secondary | ICD-10-CM | POA: Diagnosis not present

## 2017-09-09 DIAGNOSIS — L578 Other skin changes due to chronic exposure to nonionizing radiation: Secondary | ICD-10-CM | POA: Diagnosis not present

## 2017-09-09 DIAGNOSIS — L820 Inflamed seborrheic keratosis: Secondary | ICD-10-CM | POA: Diagnosis not present

## 2017-09-09 DIAGNOSIS — L821 Other seborrheic keratosis: Secondary | ICD-10-CM | POA: Diagnosis not present

## 2017-09-09 DIAGNOSIS — C44529 Squamous cell carcinoma of skin of other part of trunk: Secondary | ICD-10-CM | POA: Diagnosis not present

## 2017-10-05 DIAGNOSIS — M9902 Segmental and somatic dysfunction of thoracic region: Secondary | ICD-10-CM | POA: Diagnosis not present

## 2017-10-05 DIAGNOSIS — M4723 Other spondylosis with radiculopathy, cervicothoracic region: Secondary | ICD-10-CM | POA: Diagnosis not present

## 2017-10-05 DIAGNOSIS — M9903 Segmental and somatic dysfunction of lumbar region: Secondary | ICD-10-CM | POA: Diagnosis not present

## 2017-10-05 DIAGNOSIS — M4306 Spondylolysis, lumbar region: Secondary | ICD-10-CM | POA: Diagnosis not present

## 2017-10-05 DIAGNOSIS — M4722 Other spondylosis with radiculopathy, cervical region: Secondary | ICD-10-CM | POA: Diagnosis not present

## 2017-10-05 DIAGNOSIS — M9901 Segmental and somatic dysfunction of cervical region: Secondary | ICD-10-CM | POA: Diagnosis not present

## 2017-10-25 DIAGNOSIS — M4722 Other spondylosis with radiculopathy, cervical region: Secondary | ICD-10-CM | POA: Diagnosis not present

## 2017-10-25 DIAGNOSIS — M9902 Segmental and somatic dysfunction of thoracic region: Secondary | ICD-10-CM | POA: Diagnosis not present

## 2017-10-25 DIAGNOSIS — M4306 Spondylolysis, lumbar region: Secondary | ICD-10-CM | POA: Diagnosis not present

## 2017-10-25 DIAGNOSIS — M9903 Segmental and somatic dysfunction of lumbar region: Secondary | ICD-10-CM | POA: Diagnosis not present

## 2017-10-25 DIAGNOSIS — M9901 Segmental and somatic dysfunction of cervical region: Secondary | ICD-10-CM | POA: Diagnosis not present

## 2017-10-25 DIAGNOSIS — M4723 Other spondylosis with radiculopathy, cervicothoracic region: Secondary | ICD-10-CM | POA: Diagnosis not present

## 2017-11-02 DIAGNOSIS — N529 Male erectile dysfunction, unspecified: Secondary | ICD-10-CM | POA: Diagnosis not present

## 2017-11-11 DIAGNOSIS — N529 Male erectile dysfunction, unspecified: Secondary | ICD-10-CM | POA: Diagnosis not present

## 2017-11-17 DIAGNOSIS — M5388 Other specified dorsopathies, sacral and sacrococcygeal region: Secondary | ICD-10-CM | POA: Diagnosis not present

## 2017-11-17 DIAGNOSIS — M9903 Segmental and somatic dysfunction of lumbar region: Secondary | ICD-10-CM | POA: Diagnosis not present

## 2017-11-17 DIAGNOSIS — M9905 Segmental and somatic dysfunction of pelvic region: Secondary | ICD-10-CM | POA: Diagnosis not present

## 2017-11-17 DIAGNOSIS — M4302 Spondylolysis, cervical region: Secondary | ICD-10-CM | POA: Diagnosis not present

## 2017-11-17 DIAGNOSIS — M4726 Other spondylosis with radiculopathy, lumbar region: Secondary | ICD-10-CM | POA: Diagnosis not present

## 2017-11-17 DIAGNOSIS — M9901 Segmental and somatic dysfunction of cervical region: Secondary | ICD-10-CM | POA: Diagnosis not present

## 2017-12-02 DIAGNOSIS — M79641 Pain in right hand: Secondary | ICD-10-CM | POA: Diagnosis not present

## 2017-12-02 DIAGNOSIS — M65331 Trigger finger, right middle finger: Secondary | ICD-10-CM | POA: Diagnosis not present

## 2017-12-06 DIAGNOSIS — N529 Male erectile dysfunction, unspecified: Secondary | ICD-10-CM | POA: Diagnosis not present

## 2017-12-06 DIAGNOSIS — I10 Essential (primary) hypertension: Secondary | ICD-10-CM | POA: Diagnosis not present

## 2017-12-06 DIAGNOSIS — N419 Inflammatory disease of prostate, unspecified: Secondary | ICD-10-CM | POA: Diagnosis not present

## 2017-12-06 DIAGNOSIS — Z139 Encounter for screening, unspecified: Secondary | ICD-10-CM | POA: Diagnosis not present

## 2017-12-06 DIAGNOSIS — E039 Hypothyroidism, unspecified: Secondary | ICD-10-CM | POA: Diagnosis not present

## 2017-12-06 DIAGNOSIS — E119 Type 2 diabetes mellitus without complications: Secondary | ICD-10-CM | POA: Diagnosis not present

## 2017-12-06 DIAGNOSIS — E7800 Pure hypercholesterolemia, unspecified: Secondary | ICD-10-CM | POA: Diagnosis not present

## 2017-12-13 DIAGNOSIS — M65331 Trigger finger, right middle finger: Secondary | ICD-10-CM | POA: Diagnosis not present

## 2017-12-16 DIAGNOSIS — M9901 Segmental and somatic dysfunction of cervical region: Secondary | ICD-10-CM | POA: Diagnosis not present

## 2017-12-16 DIAGNOSIS — M5388 Other specified dorsopathies, sacral and sacrococcygeal region: Secondary | ICD-10-CM | POA: Diagnosis not present

## 2017-12-16 DIAGNOSIS — I2510 Atherosclerotic heart disease of native coronary artery without angina pectoris: Secondary | ICD-10-CM | POA: Diagnosis not present

## 2017-12-16 DIAGNOSIS — E119 Type 2 diabetes mellitus without complications: Secondary | ICD-10-CM | POA: Diagnosis not present

## 2017-12-16 DIAGNOSIS — M4303 Spondylolysis, cervicothoracic region: Secondary | ICD-10-CM | POA: Diagnosis not present

## 2017-12-16 DIAGNOSIS — M4306 Spondylolysis, lumbar region: Secondary | ICD-10-CM | POA: Diagnosis not present

## 2017-12-16 DIAGNOSIS — N529 Male erectile dysfunction, unspecified: Secondary | ICD-10-CM | POA: Diagnosis not present

## 2017-12-16 DIAGNOSIS — M9903 Segmental and somatic dysfunction of lumbar region: Secondary | ICD-10-CM | POA: Diagnosis not present

## 2017-12-16 DIAGNOSIS — E039 Hypothyroidism, unspecified: Secondary | ICD-10-CM | POA: Diagnosis not present

## 2017-12-16 DIAGNOSIS — M9904 Segmental and somatic dysfunction of sacral region: Secondary | ICD-10-CM | POA: Diagnosis not present

## 2017-12-27 DIAGNOSIS — R5383 Other fatigue: Secondary | ICD-10-CM | POA: Diagnosis not present

## 2017-12-27 DIAGNOSIS — N529 Male erectile dysfunction, unspecified: Secondary | ICD-10-CM | POA: Diagnosis not present

## 2017-12-27 DIAGNOSIS — E039 Hypothyroidism, unspecified: Secondary | ICD-10-CM | POA: Diagnosis not present

## 2017-12-27 DIAGNOSIS — E119 Type 2 diabetes mellitus without complications: Secondary | ICD-10-CM | POA: Diagnosis not present

## 2018-01-11 DIAGNOSIS — N529 Male erectile dysfunction, unspecified: Secondary | ICD-10-CM | POA: Diagnosis not present

## 2018-01-18 DIAGNOSIS — M5388 Other specified dorsopathies, sacral and sacrococcygeal region: Secondary | ICD-10-CM | POA: Diagnosis not present

## 2018-01-18 DIAGNOSIS — M4307 Spondylolysis, lumbosacral region: Secondary | ICD-10-CM | POA: Diagnosis not present

## 2018-01-18 DIAGNOSIS — M4305 Spondylolysis, thoracolumbar region: Secondary | ICD-10-CM | POA: Diagnosis not present

## 2018-01-18 DIAGNOSIS — M9903 Segmental and somatic dysfunction of lumbar region: Secondary | ICD-10-CM | POA: Diagnosis not present

## 2018-01-18 DIAGNOSIS — M9904 Segmental and somatic dysfunction of sacral region: Secondary | ICD-10-CM | POA: Diagnosis not present

## 2018-01-18 DIAGNOSIS — M9902 Segmental and somatic dysfunction of thoracic region: Secondary | ICD-10-CM | POA: Diagnosis not present

## 2018-01-21 DIAGNOSIS — B070 Plantar wart: Secondary | ICD-10-CM | POA: Diagnosis not present

## 2018-01-21 DIAGNOSIS — Z139 Encounter for screening, unspecified: Secondary | ICD-10-CM | POA: Diagnosis not present

## 2018-01-21 DIAGNOSIS — B079 Viral wart, unspecified: Secondary | ICD-10-CM | POA: Diagnosis not present

## 2018-02-07 DIAGNOSIS — B070 Plantar wart: Secondary | ICD-10-CM | POA: Diagnosis not present

## 2018-02-28 DIAGNOSIS — N419 Inflammatory disease of prostate, unspecified: Secondary | ICD-10-CM | POA: Diagnosis not present

## 2018-03-21 DIAGNOSIS — E039 Hypothyroidism, unspecified: Secondary | ICD-10-CM | POA: Diagnosis not present

## 2018-03-21 DIAGNOSIS — E119 Type 2 diabetes mellitus without complications: Secondary | ICD-10-CM | POA: Diagnosis not present

## 2018-03-21 DIAGNOSIS — I2510 Atherosclerotic heart disease of native coronary artery without angina pectoris: Secondary | ICD-10-CM | POA: Diagnosis not present

## 2018-03-21 DIAGNOSIS — E7800 Pure hypercholesterolemia, unspecified: Secondary | ICD-10-CM | POA: Diagnosis not present

## 2018-03-21 DIAGNOSIS — I10 Essential (primary) hypertension: Secondary | ICD-10-CM | POA: Diagnosis not present

## 2018-03-21 DIAGNOSIS — E118 Type 2 diabetes mellitus with unspecified complications: Secondary | ICD-10-CM | POA: Diagnosis not present

## 2018-03-24 DIAGNOSIS — M4308 Spondylolysis, sacral and sacrococcygeal region: Secondary | ICD-10-CM | POA: Diagnosis not present

## 2018-03-24 DIAGNOSIS — M9903 Segmental and somatic dysfunction of lumbar region: Secondary | ICD-10-CM | POA: Diagnosis not present

## 2018-03-24 DIAGNOSIS — M9901 Segmental and somatic dysfunction of cervical region: Secondary | ICD-10-CM | POA: Diagnosis not present

## 2018-03-24 DIAGNOSIS — M9904 Segmental and somatic dysfunction of sacral region: Secondary | ICD-10-CM | POA: Diagnosis not present

## 2018-03-24 DIAGNOSIS — M4723 Other spondylosis with radiculopathy, cervicothoracic region: Secondary | ICD-10-CM | POA: Diagnosis not present

## 2018-03-24 DIAGNOSIS — M4306 Spondylolysis, lumbar region: Secondary | ICD-10-CM | POA: Diagnosis not present

## 2018-04-06 DIAGNOSIS — N529 Male erectile dysfunction, unspecified: Secondary | ICD-10-CM | POA: Diagnosis not present

## 2018-04-06 DIAGNOSIS — R5383 Other fatigue: Secondary | ICD-10-CM | POA: Diagnosis not present

## 2018-04-15 DIAGNOSIS — M4306 Spondylolysis, lumbar region: Secondary | ICD-10-CM | POA: Diagnosis not present

## 2018-04-15 DIAGNOSIS — M9905 Segmental and somatic dysfunction of pelvic region: Secondary | ICD-10-CM | POA: Diagnosis not present

## 2018-04-15 DIAGNOSIS — M9901 Segmental and somatic dysfunction of cervical region: Secondary | ICD-10-CM | POA: Diagnosis not present

## 2018-04-15 DIAGNOSIS — M9903 Segmental and somatic dysfunction of lumbar region: Secondary | ICD-10-CM | POA: Diagnosis not present

## 2018-04-15 DIAGNOSIS — M4723 Other spondylosis with radiculopathy, cervicothoracic region: Secondary | ICD-10-CM | POA: Diagnosis not present

## 2018-04-15 DIAGNOSIS — M5432 Sciatica, left side: Secondary | ICD-10-CM | POA: Diagnosis not present

## 2018-04-20 DIAGNOSIS — M4306 Spondylolysis, lumbar region: Secondary | ICD-10-CM | POA: Diagnosis not present

## 2018-04-20 DIAGNOSIS — M5432 Sciatica, left side: Secondary | ICD-10-CM | POA: Diagnosis not present

## 2018-04-20 DIAGNOSIS — M4723 Other spondylosis with radiculopathy, cervicothoracic region: Secondary | ICD-10-CM | POA: Diagnosis not present

## 2018-04-20 DIAGNOSIS — M9905 Segmental and somatic dysfunction of pelvic region: Secondary | ICD-10-CM | POA: Diagnosis not present

## 2018-04-20 DIAGNOSIS — M9901 Segmental and somatic dysfunction of cervical region: Secondary | ICD-10-CM | POA: Diagnosis not present

## 2018-04-20 DIAGNOSIS — M9903 Segmental and somatic dysfunction of lumbar region: Secondary | ICD-10-CM | POA: Diagnosis not present

## 2018-04-26 ENCOUNTER — Encounter: Payer: Self-pay | Admitting: Cardiology

## 2018-04-26 ENCOUNTER — Ambulatory Visit (INDEPENDENT_AMBULATORY_CARE_PROVIDER_SITE_OTHER): Payer: Medicare Other | Admitting: Cardiology

## 2018-04-26 ENCOUNTER — Encounter (INDEPENDENT_AMBULATORY_CARE_PROVIDER_SITE_OTHER): Payer: Self-pay

## 2018-04-26 VITALS — BP 134/80 | HR 63 | Ht 68.0 in | Wt 190.0 lb

## 2018-04-26 DIAGNOSIS — I10 Essential (primary) hypertension: Secondary | ICD-10-CM

## 2018-04-26 DIAGNOSIS — F17210 Nicotine dependence, cigarettes, uncomplicated: Secondary | ICD-10-CM

## 2018-04-26 DIAGNOSIS — I2119 ST elevation (STEMI) myocardial infarction involving other coronary artery of inferior wall: Secondary | ICD-10-CM

## 2018-04-26 DIAGNOSIS — E785 Hyperlipidemia, unspecified: Secondary | ICD-10-CM | POA: Diagnosis not present

## 2018-04-26 DIAGNOSIS — I2510 Atherosclerotic heart disease of native coronary artery without angina pectoris: Secondary | ICD-10-CM

## 2018-04-26 DIAGNOSIS — E669 Obesity, unspecified: Secondary | ICD-10-CM

## 2018-04-26 NOTE — Patient Instructions (Addendum)
Medication Instructions:  Your physician recommends that you continue on your current medications as directed. Please refer to the Current Medication list given to you today.  If you need a refill on your cardiac medications before your next appointment, please call your pharmacy.   Lab work: Your physician recommends that you return for a FASTING lipid profile and CMET: within 1 month  If you have labs (blood work) drawn today and your tests are completely normal, you will receive your results only by: Marland Kitchen MyChart Message (if you have MyChart) OR . A paper copy in the mail If you have any lab test that is abnormal or we need to change your treatment, we will call you to review the results.  Follow-Up: At [facility], you and your health needs are our priority.  As part of our continuing mission to provide you with exceptional heart care, we have created designated Provider Care Teams.  These Care Teams include your primary Cardiologist (physician) and Advanced Practice Providers (APPs -  Physician Assistants and Nurse Practitioners) who all work together to provide you with the care you need, when you need it. You will need a follow up appointment in 1 year.  Please call our office 2 months in advance to schedule this appointment.  You may see Glenetta Hew, MD or one of the following Advanced Practice Providers on your designated Care Team:   Rosaria Ferries, PA-C . Jory Sims, DNP, ANP  Any Other Special Instructions Will Be Listed Below (If Applicable). Stop smoking!

## 2018-04-26 NOTE — Progress Notes (Signed)
PCP: Suzan Garibaldi, FNP; Dan Europe  ([facility])  Clinic Note: Chief Complaint  Patient presents with  . Follow-up  . Coronary Artery Disease    See below    HPI: [name] Dickerson is a 73 y.o. male with who presents today for annual follow-up of coronary artery disease listed below -- essentially 100% CTO of RCA with large infarct on Myoview. Cardiac History:  Inferior STEMI in 1995 -- PTCA of RCA  04/2001: Recurrent angina - overlapping BMS x 2 mRCA  09/2001: Cutting Balloon PTCA for ISR  2012: 100% mRCA occlusion, R-R bridging & L-R collaterals  2013: Myoview with Large Inferior "Infarct" w/ mild per-infarct ischemia  12/2012: Echo: EF 50-55%, distal Inferior HK  [name] Dickerson was last seen  in Feb 2019 - for annual f/u & Pre-op evaluation for Carpal Tunnel Sgx.  He was doing very well.  Still working on the farm, chasing cows etc.  Was still working on smoking cessation not fully there.  No cardiac symptoms.  Morning cough noted.  Recent Hospitalizations: None recently  Studies Personally Reviewed - (if available, images/films reviewed: From Epic Chart or Care Everywhere)  None  Interval History: [name] Dickerson presents here today for annual f/u still doing quite well.  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 He does get short of breath if he pushes himself, but denies any chest pain or pressure with rest or with activity.  He still smokes --and really was not interested in hearing too much about not smoking.  He also occasionally chews tobacco.  He has some mild musculoskeletal type chest pain off and on, but nothing with rest or exertion routinely.  No real anginal symptoms.  No PND, orthopnea or edema.  No exertional dyspnea unless he overdoes it.  No rapid irregular heartbeats palpitations just occasional skipped beats.  No syncope/near syncope.  No TIA or amaurosis  fugax  Still has a morning cough and is short of breath a little bit at baseline from smoking but no change there.  No recent exacerbations. No claudication.  ROS: A comprehensive was performed. Review of Systems  Constitutional: Negative for chills, fever and malaise/fatigue.  HENT: Negative for congestion and nosebleeds.   Respiratory: Positive for cough (Morning cough). Negative for shortness of breath and wheezing.   Gastrointestinal: Negative for abdominal pain, blood in stool, heartburn, melena, nausea and vomiting.  Genitourinary: Negative for hematuria.  Musculoskeletal: Positive for joint pain (Mostly in his hands and wrists).  Neurological: Positive for tingling (His fingers). Negative for dizziness.  Endo/Heme/Allergies: Negative for environmental allergies.  Psychiatric/Behavioral: Negative for memory loss. The patient does not have insomnia.   All other systems reviewed and are negative.  Normal arthritis pains.  I have reviewed and (if needed) personally updated the patient's problem list, medications, allergies, past medical and surgical history, social and family history.   Past Medical History:  Diagnosis Date  . CAD S/P percutaneous coronary angioplasty 1995, 2003   Inferior STEMI-PTCA RCA; 2003: Overlapping MultiLink Zeta BMS -RCA (4.0 x 23, 4.0 x 15); 09/2001 Cutting Balloon PTCA of stents for ISR.;; Myoview August 2013 - Mod-severe sized fixed INFERIOR perfusion defect => likely infarct with mild peri-infarct ischemia in the apical inferior wall, EF 55% with distal inferior hypokinesis  . Essential hypertension   . Hyperlipidemia LDL goal <70   . Hypothyroidism   .  Low testosterone   . Moderate cigarette smoker (10-19 per day)    Was down to 5 cigarettes a day.  . ST elevation myocardial infarction (STEMI) of inferior wall, subsequent episode of care [facility]) 1995   PTCA of RCA; large inferior scar noted on Myoview. Mid inferior and inferoseptal hypokinesis on  echocardiogram November 2014. EF 50-55%.  . Stenosis of coronary stent 2012   Mid RCA Occlusion with Right-To-Left and Left-To-Right Collaterals.; Relatively normal LAD, ramus intermedius and circumflex-none.    Past Surgical History:  Procedure Laterality Date  . CARDIAC CATHETERIZATION  March 2012   Mid occlusion of RCA with Right to Left and Left to Right collaterals; LAD, ramus and circumflex/OM widely patent  . CORONARY ANGIOPLASTY  1995   Inferior STEMI-PTCA of RCA  . CORONARY STENT PLACEMENT  2003   MultiLink Zeta BMS 4.0 mm x 23 mm, 4.0 mm x 15 mm --> Cutting Balloon PTCA for ISR several months later.  Marland Kitchen LexiScan Myoview  August 2013   Moderate-severe sized INFERIOR perfusion defect with no evidence of ischemia. EF 55% with distal inferior HK. Large sized Inferior and Inferolateral Infarct with perhaps mild apical inferior ischemia.  . TRANSTHORACIC ECHOCARDIOGRAM  01/09/2013   EF 50-55%; normal LV size and function. Probable mild HJ of the mid inferior and inferoseptal wall.; No valvular lesions.    Current Meds  Medication Sig  . Cinnamon 500 MG TABS Take 2,000 mg by mouth 2 (two) times daily.  . clopidogrel (PLAVIX) 75 MG tablet Take 1 tablet (75 mg total) by mouth daily with breakfast.  . levothyroxine (SYNTHROID, LEVOTHROID) 200 MCG tablet Take 200 mcg by mouth daily before breakfast.  . levothyroxine (SYNTHROID, LEVOTHROID) 25 MCG tablet Take 25 mcg by mouth daily before breakfast.  . lisinopril (PRINIVIL,ZESTRIL) 30 MG tablet Take 30 mg by mouth daily.  . metoprolol (LOPRESSOR) 50 MG tablet Take 50 mg by mouth 2 (two) times daily.  . Niacin (NICOTINIC ACID ER PO) Take 500 mg by mouth daily.  . nitroGLYCERIN (NITROSTAT) 0.4 MG SL tablet Place 1 tablet (0.4 mg total) under the tongue every 5 (five) minutes as needed for chest pain.    Allergies  Allergen Reactions  . Statins     Soreness with Crestor    Social History   Tobacco Use  . Smoking status: Current Every  Day Smoker    Packs/day: 1.00    Years: 40.00    Pack years: 40.00    Types: Cigarettes  . Smokeless tobacco: Never Used  Substance Use Topics  . Alcohol use: No  . Drug use: No   Social History   Social History Narrative   Married father of 3, grandfather 61. Works out occasionally on the treadmill. Coping to try get back into some exercise now. Unfortunately back up to smoking one pack per day. Does not drink alcohol.      Date from from 89 or 6 cigarettes a day. He's tried electronic cigarettes and down without affect.    family history is not on file.  Wt Readings from Last 3 Encounters:  04/26/18 190 lb (86.2 kg)  04/07/17 198 lb (89.8 kg)  03/05/16 191 lb 6.4 oz (86.8 kg)    PHYSICAL EXAM BP 134/80 (BP Location: Right Arm, Patient Position: Sitting, Cuff Size: Normal)   Pulse 63   Ht 5\' 8"  (1.727 m)   Wt 190 lb (86.2 kg)   BMI 28.89 kg/m  Physical Exam  Constitutional: He is oriented to person,  place, and time. He appears well-developed and well-nourished. No distress.  Well-groomed.  Moderate truncal obesity  HENT:  Head: Normocephalic and atraumatic.  Neck: Normal range of motion. Neck supple. No hepatojugular reflux and no JVD present. Carotid bruit is not present.  Cardiovascular: Normal rate, regular rhythm, normal heart sounds and normal pulses.  No extrasystoles are present. PMI is not displaced. Exam reveals no gallop and no friction rub.  No murmur heard. Pulmonary/Chest: Effort normal and breath sounds normal. No respiratory distress. He has no wheezes. He has no rales.  Abdominal: Soft. Bowel sounds are normal. He exhibits no distension. There is no abdominal tenderness. There is no rebound.  Truncal obesity  Musculoskeletal: Normal range of motion.        General: No edema.  Neurological: He is alert and oriented to person, place, and time.  Psychiatric: He has a normal mood and affect. His behavior is normal. Judgment and thought content normal.    Nursing note and vitals reviewed.   Adult ECG Report  Rate: Sinus rhythm, 68;  Rhythm: normal sinus rhythm and The computer read says PVCs, but these are considerations.;   Narrative Interpretation: Stable/normal EKG   Other studies Reviewed: Additional studies/ records that were reviewed today include:  Recent Labs:    PCP checked ~ a month ago.  Not available   ASSESSMENT / PLAN: Problem List Items Addressed This Visit    Coronary artery disease, occlusive - Primary (Chronic)    Known history of RCA stenosis/occlusion confirmed by a Myoview in 2013 showing a large inferolateral infarct but no ischemia.  He has not had really any further angina or heart failure symptoms.  Remains on stable low-dose beta-blocker plus ACE inhibitor without any major side effects. He is not on a statin because of intolerance. -->  Refuses taking mos medications for lipids He is on maintenance dose Plavix without aspirin.  Okay to hold Plavix 5-7 days preop if necessary      Relevant Orders   EKG 12-Lead (Completed)   Comprehensive Metabolic Panel (CMET)   Lipid panel   Essential hypertension (Chronic)    Borderline elevated blood pressure on lisinopril and metoprolol.  He has been on 30 mg of lisinopril for long time which is an unusual dose.  Would be room to titrate up with the 40 mg but after that would probably have to add another medicine because of heart rate of 63.      Relevant Orders   EKG 12-Lead (Completed)   Hyperlipidemia LDL goal <70 (Chronic)    We will have him come in to get labs checked because we have not seen any labs in a long time.  Based on those results, may consider evaluation by [facility] (CARDIOVASCULAR RISK REDUCTION CLINIC-read by our clinical pharmacist) --May need to consider PCSK9 inhibitor, depending what his labs look like.      Relevant Orders   EKG 12-Lead (Completed)   Comprehensive Metabolic Panel (CMET)   Lipid panel   Light cigarette smoker (1-9  cigarettes per day) (Chronic)    Pretty much stable at 5 cigarettes a day.  I told him about the need to finally quit but he is not interested.  Would like for him to be done smoking altogether.      Relevant Orders   EKG 12-Lead (Completed)   Obesity (BMI 30-39.9) (Chronic)    Stable weight now puts him below the obesity category.      Relevant Orders   EKG  12-Lead (Completed)   ST elevation myocardial infarction (STEMI) of inferior wall, subsequent episode of care [facility]) (Chronic)    Very distant history of MI.  No further symptoms.  Large scar seen on Myoview in the past.  Reluctant to do Myoview in follow-up.      Relevant Orders   EKG 12-Lead (Completed)      Current medicines are reviewed at length with the patient today. (+/- concerns) none The following changes have been made:None  Patient Instructions  Medication Instructions:  Your physician recommends that you continue on your current medications as directed. Please refer to the Current Medication list given to you today.  If you need a refill on your cardiac medications before your next appointment, please call your pharmacy.   Lab work: Your physician recommends that you return for a FASTING lipid profile and CMET: within 1 month  If you have labs (blood work) drawn today and your tests are completely normal, you will receive your results only by: Marland Kitchen MyChart Message (if you have MyChart) OR . A paper copy in the mail If you have any lab test that is abnormal or we need to change your treatment, we will call you to review the results.  Follow-Up: At [facility], you and your health needs are our priority.  As part of our continuing mission to provide you with exceptional heart care, we have created designated Provider Care Teams.  These Care Teams include your primary Cardiologist (physician) and Advanced Practice Providers (APPs -  Physician Assistants and Nurse Practitioners) who all work together to provide  you with the care you need, when you need it. You will need a follow up appointment in 1 year.  Please call our office 2 months in advance to schedule this appointment.  You may see Glenetta Hew, MD or one of the following Advanced Practice Providers on your designated Care Team:   Rosaria Ferries, PA-C . Jory Sims, DNP, ANP  Any Other Special Instructions Will Be Listed Below (If Applicable). Stop smoking!     Studies Ordered:   Orders Placed This Encounter  Procedures  . Comprehensive Metabolic Panel (CMET)  . Lipid panel  . EKG 12-Lead      Glenetta Hew, M.D., M.S. Interventional Cardiologist   Pager # [phone] Phone # [phone] [address] [place], [place] [address]   Thank you for choosing Heartcare at [facility]!!     ADDENDUM:  PREOPERATIVE CARDIAC RISK ASSESSMENT   Revised Cardiac Risk Index:  High Risk Surgery: no;  Defined as Intraperitoneal, intrathoracic or suprainguinal vascular  Active CAD: no;   CHF: no; has preserved EF and no heart failure symptoms  Cerebrovascular Disease: no;   Diabetes: no; On Insulin: no  CKD (Cr >~ 2): no;   Total: 0 Estimated Risk of Adverse Outcome: LOW RISK Estimated Risk of MI, PE, VF/VT (Cardiac Arrest), Complete Heart Block: 1 %   ACC/AHA Guidelines for "Clearance":  Step 1 - Need for Emergency Surgery: No:   If Yes - go straight to OR with perioperative surveillance  Step 2 - Active Cardiac Conditions (Unstable Angina, Decompensated HF, Significant  Arrhytmias - Complete HB, Mobitz II, Symptomatic VT or SVT, Severe Aortic Stenosis - mean gradient > 40 mmHg, Valve area < 1.0 cm2):   No:   If Yes - Evaluate & Treat per ACC/AHA Guidelines  Step 3 -  Low Risk Surgery: No:   If Yes --> proceed to OR  If No --> Step 4  Step 4 - Functional Capacity >= 4 METS without symptoms: Yes  If Yes --> proceed to OR  If No --> Step 5   Step 5 --  Clinical Risk Factors (CRF)   3  or more: No: 0  If Yes -- assess Surgical Risk, --   (High Risk Non-cardiac), Intraabdominal or thoracic vascular surgery consider testing if it will change management.  Intermediate Risk: Proceed to OR with HR control, or consider testing if it will change management  No CRFs: Yes  If Yes --> Proceed to OR

## 2018-04-28 ENCOUNTER — Encounter: Payer: Self-pay | Admitting: Cardiology

## 2018-04-28 NOTE — Assessment & Plan Note (Signed)
Stable weight now puts him below the obesity category.

## 2018-04-28 NOTE — Assessment & Plan Note (Signed)
Very distant history of MI.  No further symptoms.  Large scar seen on Myoview in the past.  Reluctant to do Myoview in follow-up.

## 2018-04-28 NOTE — Assessment & Plan Note (Signed)
Borderline elevated blood pressure on lisinopril and metoprolol.  He has been on 30 mg of lisinopril for long time which is an unusual dose.  Would be room to titrate up with the 40 mg but after that would probably have to add another medicine because of heart rate of 63.

## 2018-04-28 NOTE — Assessment & Plan Note (Signed)
Known history of RCA stenosis/occlusion confirmed by a Myoview in 2013 showing a large inferolateral infarct but no ischemia.  He has not had really any further angina or heart failure symptoms.  Remains on stable low-dose beta-blocker plus ACE inhibitor without any major side effects. He is not on a statin because of intolerance. -->  Refuses taking mos medications for lipids He is on maintenance dose Plavix without aspirin.  Okay to hold Plavix 5-7 days preop if necessary

## 2018-04-28 NOTE — Assessment & Plan Note (Signed)
We will have him come in to get labs checked because we have not seen any labs in a long time.  Based on those results, may consider evaluation by [facility] (CARDIOVASCULAR RISK REDUCTION CLINIC-read by our clinical pharmacist) --May need to consider PCSK9 inhibitor, depending what his labs look like.

## 2018-04-28 NOTE — Assessment & Plan Note (Addendum)
Pretty much stable at 5 cigarettes a day.  I told him about the need to finally quit but he is not interested.  Would like for him to be done smoking altogether.

## 2018-05-02 DIAGNOSIS — I10 Essential (primary) hypertension: Secondary | ICD-10-CM | POA: Diagnosis not present

## 2018-05-02 DIAGNOSIS — Z7902 Long term (current) use of antithrombotics/antiplatelets: Secondary | ICD-10-CM | POA: Diagnosis not present

## 2018-05-02 DIAGNOSIS — Z955 Presence of coronary angioplasty implant and graft: Secondary | ICD-10-CM | POA: Diagnosis not present

## 2018-05-02 DIAGNOSIS — J101 Influenza due to other identified influenza virus with other respiratory manifestations: Secondary | ICD-10-CM | POA: Diagnosis not present

## 2018-05-02 DIAGNOSIS — Z79899 Other long term (current) drug therapy: Secondary | ICD-10-CM | POA: Diagnosis not present

## 2018-05-02 DIAGNOSIS — E039 Hypothyroidism, unspecified: Secondary | ICD-10-CM | POA: Diagnosis not present

## 2018-05-02 DIAGNOSIS — F17210 Nicotine dependence, cigarettes, uncomplicated: Secondary | ICD-10-CM | POA: Diagnosis not present

## 2018-05-02 DIAGNOSIS — I252 Old myocardial infarction: Secondary | ICD-10-CM | POA: Diagnosis not present

## 2018-05-02 DIAGNOSIS — I2510 Atherosclerotic heart disease of native coronary artery without angina pectoris: Secondary | ICD-10-CM | POA: Diagnosis not present

## 2018-05-02 DIAGNOSIS — R0602 Shortness of breath: Secondary | ICD-10-CM | POA: Diagnosis not present

## 2018-05-02 DIAGNOSIS — R079 Chest pain, unspecified: Secondary | ICD-10-CM | POA: Diagnosis not present

## 2018-05-02 DIAGNOSIS — E785 Hyperlipidemia, unspecified: Secondary | ICD-10-CM | POA: Diagnosis not present

## 2018-05-11 DIAGNOSIS — I2510 Atherosclerotic heart disease of native coronary artery without angina pectoris: Secondary | ICD-10-CM | POA: Diagnosis not present

## 2018-05-11 DIAGNOSIS — E785 Hyperlipidemia, unspecified: Secondary | ICD-10-CM | POA: Diagnosis not present

## 2018-05-11 LAB — COMPREHENSIVE METABOLIC PANEL
ALT: 21 IU/L (ref 0–44)
AST: 20 IU/L (ref 0–40)
Albumin/Globulin Ratio: 2.4 — ABNORMAL HIGH (ref 1.2–2.2)
Albumin: 4.7 g/dL (ref 3.8–4.8)
Alkaline Phosphatase: 85 IU/L (ref 39–117)
BUN/Creatinine Ratio: 12 (ref 10–24)
BUN: 12 mg/dL (ref 8–27)
Bilirubin Total: 0.8 mg/dL (ref 0.0–1.2)
CO2: 22 mmol/L (ref 20–29)
Calcium: 9.3 mg/dL (ref 8.6–10.2)
Chloride: 102 mmol/L (ref 96–106)
Creatinine, Ser: 1.03 mg/dL (ref 0.76–1.27)
GFR calc Af Amer: 85 mL/min/{1.73_m2} (ref >59)
GFR calc non Af Amer: 73 mL/min/{1.73_m2} (ref >59)
Globulin, Total: 2 g/dL (ref 1.5–4.5)
Glucose: 133 mg/dL — ABNORMAL HIGH (ref 65–99)
Potassium: 4.9 mmol/L (ref 3.5–5.2)
Sodium: 142 mmol/L (ref 134–144)
Total Protein: 6.7 g/dL (ref 6.0–8.5)

## 2018-05-11 LAB — LIPID PANEL
Chol/HDL Ratio: 4.7 ratio (ref 0.0–5.0)
Cholesterol, Total: 161 mg/dL (ref 100–199)
HDL: 34 mg/dL — ABNORMAL LOW (ref >39)
LDL Calculated: 94 mg/dL (ref 0–99)
TRIGLYCERIDES: 167 mg/dL — AB (ref 0–149)
VLDL Cholesterol Cal: 33 mg/dL (ref 5–40)

## 2018-05-18 DIAGNOSIS — M9905 Segmental and somatic dysfunction of pelvic region: Secondary | ICD-10-CM | POA: Diagnosis not present

## 2018-05-18 DIAGNOSIS — M9903 Segmental and somatic dysfunction of lumbar region: Secondary | ICD-10-CM | POA: Diagnosis not present

## 2018-05-18 DIAGNOSIS — M5432 Sciatica, left side: Secondary | ICD-10-CM | POA: Diagnosis not present

## 2018-05-18 DIAGNOSIS — M9904 Segmental and somatic dysfunction of sacral region: Secondary | ICD-10-CM | POA: Diagnosis not present

## 2018-05-18 DIAGNOSIS — M4306 Spondylolysis, lumbar region: Secondary | ICD-10-CM | POA: Diagnosis not present

## 2018-05-18 DIAGNOSIS — M5388 Other specified dorsopathies, sacral and sacrococcygeal region: Secondary | ICD-10-CM | POA: Diagnosis not present

## 2018-06-14 DIAGNOSIS — E119 Type 2 diabetes mellitus without complications: Secondary | ICD-10-CM | POA: Diagnosis not present

## 2018-06-14 DIAGNOSIS — I2510 Atherosclerotic heart disease of native coronary artery without angina pectoris: Secondary | ICD-10-CM | POA: Diagnosis not present

## 2018-06-14 DIAGNOSIS — I10 Essential (primary) hypertension: Secondary | ICD-10-CM | POA: Diagnosis not present

## 2018-06-14 DIAGNOSIS — E039 Hypothyroidism, unspecified: Secondary | ICD-10-CM | POA: Diagnosis not present

## 2018-06-22 DIAGNOSIS — M5388 Other specified dorsopathies, sacral and sacrococcygeal region: Secondary | ICD-10-CM | POA: Diagnosis not present

## 2018-06-22 DIAGNOSIS — M9903 Segmental and somatic dysfunction of lumbar region: Secondary | ICD-10-CM | POA: Diagnosis not present

## 2018-06-22 DIAGNOSIS — M9904 Segmental and somatic dysfunction of sacral region: Secondary | ICD-10-CM | POA: Diagnosis not present

## 2018-06-22 DIAGNOSIS — M4307 Spondylolysis, lumbosacral region: Secondary | ICD-10-CM | POA: Diagnosis not present

## 2018-06-22 DIAGNOSIS — M4302 Spondylolysis, cervical region: Secondary | ICD-10-CM | POA: Diagnosis not present

## 2018-06-22 DIAGNOSIS — M9901 Segmental and somatic dysfunction of cervical region: Secondary | ICD-10-CM | POA: Diagnosis not present

## 2018-07-28 DIAGNOSIS — J019 Acute sinusitis, unspecified: Secondary | ICD-10-CM | POA: Diagnosis not present

## 2018-07-28 DIAGNOSIS — H6502 Acute serous otitis media, left ear: Secondary | ICD-10-CM | POA: Diagnosis not present

## 2018-07-28 DIAGNOSIS — D110 Benign neoplasm of parotid gland: Secondary | ICD-10-CM | POA: Diagnosis not present

## 2018-07-29 DIAGNOSIS — M9904 Segmental and somatic dysfunction of sacral region: Secondary | ICD-10-CM | POA: Diagnosis not present

## 2018-07-29 DIAGNOSIS — M5388 Other specified dorsopathies, sacral and sacrococcygeal region: Secondary | ICD-10-CM | POA: Diagnosis not present

## 2018-07-29 DIAGNOSIS — M9903 Segmental and somatic dysfunction of lumbar region: Secondary | ICD-10-CM | POA: Diagnosis not present

## 2018-07-29 DIAGNOSIS — M47891 Other spondylosis, occipito-atlanto-axial region: Secondary | ICD-10-CM | POA: Diagnosis not present

## 2018-07-29 DIAGNOSIS — K118 Other diseases of salivary glands: Secondary | ICD-10-CM | POA: Diagnosis not present

## 2018-07-29 DIAGNOSIS — R221 Localized swelling, mass and lump, neck: Secondary | ICD-10-CM | POA: Diagnosis not present

## 2018-07-29 DIAGNOSIS — M47816 Spondylosis without myelopathy or radiculopathy, lumbar region: Secondary | ICD-10-CM | POA: Diagnosis not present

## 2018-07-29 DIAGNOSIS — M9901 Segmental and somatic dysfunction of cervical region: Secondary | ICD-10-CM | POA: Diagnosis not present

## 2018-08-04 DIAGNOSIS — M9903 Segmental and somatic dysfunction of lumbar region: Secondary | ICD-10-CM | POA: Diagnosis not present

## 2018-08-04 DIAGNOSIS — M9901 Segmental and somatic dysfunction of cervical region: Secondary | ICD-10-CM | POA: Diagnosis not present

## 2018-08-04 DIAGNOSIS — M9904 Segmental and somatic dysfunction of sacral region: Secondary | ICD-10-CM | POA: Diagnosis not present

## 2018-08-04 DIAGNOSIS — M47816 Spondylosis without myelopathy or radiculopathy, lumbar region: Secondary | ICD-10-CM | POA: Diagnosis not present

## 2018-08-04 DIAGNOSIS — M47891 Other spondylosis, occipito-atlanto-axial region: Secondary | ICD-10-CM | POA: Diagnosis not present

## 2018-08-04 DIAGNOSIS — M5388 Other specified dorsopathies, sacral and sacrococcygeal region: Secondary | ICD-10-CM | POA: Diagnosis not present

## 2018-08-09 DIAGNOSIS — Z7984 Long term (current) use of oral hypoglycemic drugs: Secondary | ICD-10-CM | POA: Diagnosis not present

## 2018-08-09 DIAGNOSIS — H5203 Hypermetropia, bilateral: Secondary | ICD-10-CM | POA: Diagnosis not present

## 2018-08-09 DIAGNOSIS — E119 Type 2 diabetes mellitus without complications: Secondary | ICD-10-CM | POA: Diagnosis not present

## 2018-08-09 DIAGNOSIS — H52223 Regular astigmatism, bilateral: Secondary | ICD-10-CM | POA: Diagnosis not present

## 2018-08-24 DIAGNOSIS — M47891 Other spondylosis, occipito-atlanto-axial region: Secondary | ICD-10-CM | POA: Diagnosis not present

## 2018-08-24 DIAGNOSIS — M5388 Other specified dorsopathies, sacral and sacrococcygeal region: Secondary | ICD-10-CM | POA: Diagnosis not present

## 2018-08-24 DIAGNOSIS — M47816 Spondylosis without myelopathy or radiculopathy, lumbar region: Secondary | ICD-10-CM | POA: Diagnosis not present

## 2018-08-24 DIAGNOSIS — M9904 Segmental and somatic dysfunction of sacral region: Secondary | ICD-10-CM | POA: Diagnosis not present

## 2018-08-24 DIAGNOSIS — M9903 Segmental and somatic dysfunction of lumbar region: Secondary | ICD-10-CM | POA: Diagnosis not present

## 2018-08-24 DIAGNOSIS — M9901 Segmental and somatic dysfunction of cervical region: Secondary | ICD-10-CM | POA: Diagnosis not present

## 2018-08-30 DIAGNOSIS — R221 Localized swelling, mass and lump, neck: Secondary | ICD-10-CM | POA: Diagnosis not present

## 2018-08-30 DIAGNOSIS — H9193 Unspecified hearing loss, bilateral: Secondary | ICD-10-CM | POA: Diagnosis not present

## 2018-08-30 DIAGNOSIS — D490 Neoplasm of unspecified behavior of digestive system: Secondary | ICD-10-CM | POA: Diagnosis not present

## 2018-08-30 DIAGNOSIS — F17200 Nicotine dependence, unspecified, uncomplicated: Secondary | ICD-10-CM | POA: Diagnosis not present

## 2018-09-13 DIAGNOSIS — K118 Other diseases of salivary glands: Secondary | ICD-10-CM | POA: Diagnosis not present

## 2018-09-13 DIAGNOSIS — Z7902 Long term (current) use of antithrombotics/antiplatelets: Secondary | ICD-10-CM | POA: Diagnosis not present

## 2018-09-13 DIAGNOSIS — R896 Abnormal cytological findings in specimens from other organs, systems and tissues: Secondary | ICD-10-CM | POA: Diagnosis not present

## 2018-09-13 DIAGNOSIS — D110 Benign neoplasm of parotid gland: Secondary | ICD-10-CM | POA: Diagnosis not present

## 2018-09-16 DIAGNOSIS — I10 Essential (primary) hypertension: Secondary | ICD-10-CM | POA: Diagnosis not present

## 2018-09-16 DIAGNOSIS — E038 Other specified hypothyroidism: Secondary | ICD-10-CM | POA: Diagnosis not present

## 2018-09-16 DIAGNOSIS — E0789 Other specified disorders of thyroid: Secondary | ICD-10-CM | POA: Diagnosis not present

## 2018-09-16 DIAGNOSIS — N529 Male erectile dysfunction, unspecified: Secondary | ICD-10-CM | POA: Diagnosis not present

## 2018-09-16 DIAGNOSIS — N419 Inflammatory disease of prostate, unspecified: Secondary | ICD-10-CM | POA: Diagnosis not present

## 2018-09-16 DIAGNOSIS — E039 Hypothyroidism, unspecified: Secondary | ICD-10-CM | POA: Diagnosis not present

## 2018-09-16 DIAGNOSIS — E119 Type 2 diabetes mellitus without complications: Secondary | ICD-10-CM | POA: Diagnosis not present

## 2018-09-16 DIAGNOSIS — E7800 Pure hypercholesterolemia, unspecified: Secondary | ICD-10-CM | POA: Diagnosis not present

## 2018-09-16 DIAGNOSIS — R5383 Other fatigue: Secondary | ICD-10-CM | POA: Diagnosis not present

## 2018-09-19 DIAGNOSIS — N401 Enlarged prostate with lower urinary tract symptoms: Secondary | ICD-10-CM | POA: Diagnosis not present

## 2018-09-19 DIAGNOSIS — R221 Localized swelling, mass and lump, neck: Secondary | ICD-10-CM | POA: Diagnosis not present

## 2018-09-19 DIAGNOSIS — D490 Neoplasm of unspecified behavior of digestive system: Secondary | ICD-10-CM | POA: Diagnosis not present

## 2018-09-19 DIAGNOSIS — N538 Other male sexual dysfunction: Secondary | ICD-10-CM | POA: Diagnosis not present

## 2018-09-19 DIAGNOSIS — E291 Testicular hypofunction: Secondary | ICD-10-CM | POA: Diagnosis not present

## 2018-09-19 DIAGNOSIS — N3000 Acute cystitis without hematuria: Secondary | ICD-10-CM | POA: Diagnosis not present

## 2018-09-21 DIAGNOSIS — M9903 Segmental and somatic dysfunction of lumbar region: Secondary | ICD-10-CM | POA: Diagnosis not present

## 2018-09-21 DIAGNOSIS — M47816 Spondylosis without myelopathy or radiculopathy, lumbar region: Secondary | ICD-10-CM | POA: Diagnosis not present

## 2018-09-21 DIAGNOSIS — M5388 Other specified dorsopathies, sacral and sacrococcygeal region: Secondary | ICD-10-CM | POA: Diagnosis not present

## 2018-09-21 DIAGNOSIS — M9904 Segmental and somatic dysfunction of sacral region: Secondary | ICD-10-CM | POA: Diagnosis not present

## 2018-09-21 DIAGNOSIS — M47891 Other spondylosis, occipito-atlanto-axial region: Secondary | ICD-10-CM | POA: Diagnosis not present

## 2018-09-21 DIAGNOSIS — M9901 Segmental and somatic dysfunction of cervical region: Secondary | ICD-10-CM | POA: Diagnosis not present

## 2018-09-29 DIAGNOSIS — N529 Male erectile dysfunction, unspecified: Secondary | ICD-10-CM | POA: Diagnosis not present

## 2018-09-29 DIAGNOSIS — E299 Testicular dysfunction, unspecified: Secondary | ICD-10-CM | POA: Diagnosis not present

## 2018-09-30 ENCOUNTER — Other Ambulatory Visit: Payer: Self-pay

## 2018-10-11 DIAGNOSIS — D110 Benign neoplasm of parotid gland: Secondary | ICD-10-CM | POA: Diagnosis not present

## 2018-10-21 DIAGNOSIS — M4722 Other spondylosis with radiculopathy, cervical region: Secondary | ICD-10-CM | POA: Diagnosis not present

## 2018-10-21 DIAGNOSIS — E299 Testicular dysfunction, unspecified: Secondary | ICD-10-CM | POA: Diagnosis not present

## 2018-10-21 DIAGNOSIS — M9903 Segmental and somatic dysfunction of lumbar region: Secondary | ICD-10-CM | POA: Diagnosis not present

## 2018-10-21 DIAGNOSIS — M4723 Other spondylosis with radiculopathy, cervicothoracic region: Secondary | ICD-10-CM | POA: Diagnosis not present

## 2018-10-21 DIAGNOSIS — N529 Male erectile dysfunction, unspecified: Secondary | ICD-10-CM | POA: Diagnosis not present

## 2018-10-21 DIAGNOSIS — M9902 Segmental and somatic dysfunction of thoracic region: Secondary | ICD-10-CM | POA: Diagnosis not present

## 2018-10-21 DIAGNOSIS — M9901 Segmental and somatic dysfunction of cervical region: Secondary | ICD-10-CM | POA: Diagnosis not present

## 2018-10-21 DIAGNOSIS — M47897 Other spondylosis, lumbosacral region: Secondary | ICD-10-CM | POA: Diagnosis not present

## 2018-10-23 DIAGNOSIS — K118 Other diseases of salivary glands: Secondary | ICD-10-CM

## 2018-10-23 HISTORY — DX: Other diseases of salivary glands: K11.8

## 2018-10-24 DIAGNOSIS — M65342 Trigger finger, left ring finger: Secondary | ICD-10-CM | POA: Diagnosis not present

## 2018-10-24 DIAGNOSIS — M65322 Trigger finger, left index finger: Secondary | ICD-10-CM | POA: Diagnosis not present

## 2018-10-24 DIAGNOSIS — F17210 Nicotine dependence, cigarettes, uncomplicated: Secondary | ICD-10-CM | POA: Insufficient documentation

## 2018-10-24 DIAGNOSIS — E079 Disorder of thyroid, unspecified: Secondary | ICD-10-CM

## 2018-10-24 DIAGNOSIS — M1990 Unspecified osteoarthritis, unspecified site: Secondary | ICD-10-CM

## 2018-10-24 DIAGNOSIS — D110 Benign neoplasm of parotid gland: Secondary | ICD-10-CM | POA: Diagnosis not present

## 2018-10-24 DIAGNOSIS — Z955 Presence of coronary angioplasty implant and graft: Secondary | ICD-10-CM | POA: Insufficient documentation

## 2018-10-24 DIAGNOSIS — K118 Other diseases of salivary glands: Secondary | ICD-10-CM | POA: Diagnosis not present

## 2018-10-24 HISTORY — DX: Unspecified osteoarthritis, unspecified site: M19.90

## 2018-10-24 HISTORY — DX: Nicotine dependence, cigarettes, uncomplicated: F17.210

## 2018-10-24 HISTORY — DX: Presence of coronary angioplasty implant and graft: Z95.5

## 2018-10-24 HISTORY — DX: Disorder of thyroid, unspecified: E07.9

## 2018-11-23 DIAGNOSIS — M47892 Other spondylosis, cervical region: Secondary | ICD-10-CM | POA: Diagnosis not present

## 2018-11-23 DIAGNOSIS — M5431 Sciatica, right side: Secondary | ICD-10-CM | POA: Diagnosis not present

## 2018-11-23 DIAGNOSIS — M9905 Segmental and somatic dysfunction of pelvic region: Secondary | ICD-10-CM | POA: Diagnosis not present

## 2018-11-23 DIAGNOSIS — M9903 Segmental and somatic dysfunction of lumbar region: Secondary | ICD-10-CM | POA: Diagnosis not present

## 2018-11-23 DIAGNOSIS — M4727 Other spondylosis with radiculopathy, lumbosacral region: Secondary | ICD-10-CM | POA: Diagnosis not present

## 2018-11-23 DIAGNOSIS — M9901 Segmental and somatic dysfunction of cervical region: Secondary | ICD-10-CM | POA: Diagnosis not present

## 2018-12-15 DIAGNOSIS — R070 Pain in throat: Secondary | ICD-10-CM | POA: Diagnosis not present

## 2018-12-15 DIAGNOSIS — D119 Benign neoplasm of major salivary gland, unspecified: Secondary | ICD-10-CM | POA: Diagnosis not present

## 2018-12-15 DIAGNOSIS — L0211 Cutaneous abscess of neck: Secondary | ICD-10-CM | POA: Diagnosis not present

## 2018-12-19 DIAGNOSIS — D119 Benign neoplasm of major salivary gland, unspecified: Secondary | ICD-10-CM | POA: Diagnosis not present

## 2018-12-19 DIAGNOSIS — D110 Benign neoplasm of parotid gland: Secondary | ICD-10-CM | POA: Diagnosis not present

## 2018-12-23 DIAGNOSIS — M9904 Segmental and somatic dysfunction of sacral region: Secondary | ICD-10-CM | POA: Diagnosis not present

## 2018-12-23 DIAGNOSIS — M9901 Segmental and somatic dysfunction of cervical region: Secondary | ICD-10-CM | POA: Diagnosis not present

## 2018-12-23 DIAGNOSIS — M4307 Spondylolysis, lumbosacral region: Secondary | ICD-10-CM | POA: Diagnosis not present

## 2018-12-23 DIAGNOSIS — M4722 Other spondylosis with radiculopathy, cervical region: Secondary | ICD-10-CM | POA: Diagnosis not present

## 2018-12-23 DIAGNOSIS — M9903 Segmental and somatic dysfunction of lumbar region: Secondary | ICD-10-CM | POA: Diagnosis not present

## 2018-12-23 DIAGNOSIS — M5388 Other specified dorsopathies, sacral and sacrococcygeal region: Secondary | ICD-10-CM | POA: Diagnosis not present

## 2019-01-11 DIAGNOSIS — K118 Other diseases of salivary glands: Secondary | ICD-10-CM | POA: Diagnosis not present

## 2019-01-11 DIAGNOSIS — Z01812 Encounter for preprocedural laboratory examination: Secondary | ICD-10-CM | POA: Diagnosis not present

## 2019-01-11 DIAGNOSIS — Z20828 Contact with and (suspected) exposure to other viral communicable diseases: Secondary | ICD-10-CM | POA: Diagnosis not present

## 2019-01-11 DIAGNOSIS — E119 Type 2 diabetes mellitus without complications: Secondary | ICD-10-CM | POA: Insufficient documentation

## 2019-01-11 HISTORY — DX: Type 2 diabetes mellitus without complications: E11.9

## 2019-01-17 DIAGNOSIS — D110 Benign neoplasm of parotid gland: Secondary | ICD-10-CM | POA: Diagnosis not present

## 2019-01-17 DIAGNOSIS — Z20828 Contact with and (suspected) exposure to other viral communicable diseases: Secondary | ICD-10-CM | POA: Diagnosis not present

## 2019-01-17 DIAGNOSIS — E119 Type 2 diabetes mellitus without complications: Secondary | ICD-10-CM | POA: Diagnosis not present

## 2019-01-17 DIAGNOSIS — K118 Other diseases of salivary glands: Secondary | ICD-10-CM | POA: Diagnosis not present

## 2019-01-23 DIAGNOSIS — M4307 Spondylolysis, lumbosacral region: Secondary | ICD-10-CM | POA: Diagnosis not present

## 2019-01-23 DIAGNOSIS — M9903 Segmental and somatic dysfunction of lumbar region: Secondary | ICD-10-CM | POA: Diagnosis not present

## 2019-01-24 DIAGNOSIS — N529 Male erectile dysfunction, unspecified: Secondary | ICD-10-CM | POA: Diagnosis not present

## 2019-01-24 DIAGNOSIS — E291 Testicular hypofunction: Secondary | ICD-10-CM | POA: Diagnosis not present

## 2019-01-30 DIAGNOSIS — Z888 Allergy status to other drugs, medicaments and biological substances status: Secondary | ICD-10-CM | POA: Diagnosis not present

## 2019-01-30 DIAGNOSIS — Z48815 Encounter for surgical aftercare following surgery on the digestive system: Secondary | ICD-10-CM | POA: Diagnosis not present

## 2019-02-08 DIAGNOSIS — M9903 Segmental and somatic dysfunction of lumbar region: Secondary | ICD-10-CM | POA: Diagnosis not present

## 2019-02-08 DIAGNOSIS — M4305 Spondylolysis, thoracolumbar region: Secondary | ICD-10-CM | POA: Diagnosis not present

## 2019-06-05 ENCOUNTER — Telehealth: Payer: Self-pay

## 2019-06-05 ENCOUNTER — Other Ambulatory Visit: Payer: Self-pay | Admitting: Student

## 2019-06-05 DIAGNOSIS — G8929 Other chronic pain: Secondary | ICD-10-CM

## 2019-06-05 DIAGNOSIS — M5442 Lumbago with sciatica, left side: Secondary | ICD-10-CM

## 2019-06-05 NOTE — Telephone Encounter (Signed)
Spoke with patient to review his medications and drug allergies before being scheduled for a lumbar myelogram.  He was informed he will be here for two hours, will need a driver and will need to be on strict bedrest for 24 hours after this procedure.  He also was informed he will need to hold Tramadol (which he says he hardly ever takes) for 48 hours before, and 24 hours after, the myelogram.  Finally, he was told to hold Plavix for five days before the myelogram but not to stop this medication until we get clearance from the practitioner who prescribes it and we get him scheduled.

## 2019-06-13 ENCOUNTER — Ambulatory Visit
Admission: RE | Admit: 2019-06-13 | Discharge: 2019-06-13 | Disposition: A | Discharge status: Home / Self Care | Payer: Medicare Other | Source: Ambulatory Visit | Attending: Student | Admitting: Student

## 2019-06-13 ENCOUNTER — Other Ambulatory Visit: Payer: Self-pay

## 2019-06-13 DIAGNOSIS — M5441 Lumbago with sciatica, right side: Secondary | ICD-10-CM

## 2019-06-13 DIAGNOSIS — G8929 Other chronic pain: Secondary | ICD-10-CM

## 2019-06-13 MED ORDER — DIAZEPAM 5 MG PO TABS
5.0000 mg | ORAL_TABLET | Freq: Once | ORAL | Status: AC
  Administered 2019-06-13: 5 mg via ORAL

## 2019-06-13 MED ORDER — IOPAMIDOL (ISOVUE-M 200) INJECTION 41%
15.0000 mL | Freq: Once | INTRAMUSCULAR | Status: AC
  Administered 2019-06-13: 15 mL via INTRATHECAL

## 2019-06-13 NOTE — Discharge Instructions (Signed)
Myelogram Discharge Instructions  1. Go home and rest quietly for the next 24 hours.  It is important to lie flat for the next 24 hours.  Get up only to go to the restroom.  You may lie in the bed or on a couch on your back, your stomach, your left side or your right side.  You may have one pillow under your head.  You may have pillows between your knees while you are on your side or under your knees while you are on your back.  2. DO NOT drive today.  Recline the seat as far back as it will go, while still wearing your seat belt, on the way home.  3. You may get up to go to the bathroom as needed.  You may sit up for 10 minutes to eat.  You may resume your normal diet and medications unless otherwise indicated.  Drink lots of extra fluids today and tomorrow.  4. The incidence of headache, nausea, or vomiting is about 5% (one in 20 patients).  If you develop a headache, lie flat and drink plenty of fluids until the headache goes away.  Caffeinated beverages may be helpful.  If you develop severe nausea and vomiting or a headache that does not go away with flat bed rest, call [phone].  5. You may resume normal activities after your 24 hours of bed rest is over; however, do not exert yourself strongly or do any heavy lifting tomorrow. If when you get up you have a headache when standing, go back to bed and force fluids for another 24 hours.  6. Call your physician for a follow-up appointment.  The results of your myelogram will be sent directly to your physician by the following day.  7. If you have any questions or if complications develop after you arrive home, please call [phone].  Discharge instructions have been explained to the patient.  The patient, or the person responsible for the patient, fully understands these instructions.  YOU MAY RESTART YOUR PLAVIX TODAY. YOU MAY RESTART YOUR TRAMADOL TOMORROW 06/14/19 AT 10:30AM.

## 2019-09-29 ENCOUNTER — Ambulatory Visit (INDEPENDENT_AMBULATORY_CARE_PROVIDER_SITE_OTHER): Payer: Medicare Other | Admitting: Cardiology

## 2019-09-29 ENCOUNTER — Encounter (INDEPENDENT_AMBULATORY_CARE_PROVIDER_SITE_OTHER): Payer: Self-pay

## 2019-09-29 ENCOUNTER — Other Ambulatory Visit: Payer: Self-pay

## 2019-09-29 ENCOUNTER — Telehealth: Payer: Self-pay | Admitting: *Deleted

## 2019-09-29 ENCOUNTER — Encounter: Payer: Self-pay | Admitting: Cardiology

## 2019-09-29 ENCOUNTER — Ambulatory Visit: Payer: Medicare Other | Admitting: Cardiology

## 2019-09-29 VITALS — BP 160/66 | HR 64 | Ht 68.0 in | Wt 194.0 lb

## 2019-09-29 DIAGNOSIS — Z01810 Encounter for preprocedural cardiovascular examination: Secondary | ICD-10-CM

## 2019-09-29 DIAGNOSIS — E785 Hyperlipidemia, unspecified: Secondary | ICD-10-CM | POA: Diagnosis not present

## 2019-09-29 DIAGNOSIS — I10 Essential (primary) hypertension: Secondary | ICD-10-CM

## 2019-09-29 DIAGNOSIS — I2510 Atherosclerotic heart disease of native coronary artery without angina pectoris: Secondary | ICD-10-CM

## 2019-09-29 MED ORDER — POTASSIUM CHLORIDE CRYS ER 20 MEQ PO TBCR: EXTENDED_RELEASE_TABLET | ORAL | 1 refills | Status: DC

## 2019-09-29 MED ORDER — REPATHA SURECLICK 140 MG/ML ~~LOC~~ SOAJ: 140.0000 mg | SUBCUTANEOUS | 1 refills | Status: DC

## 2019-09-29 MED ORDER — FUROSEMIDE 20 MG PO TABS: ORAL_TABLET | ORAL | 1 refills | Status: DC

## 2019-09-29 NOTE — Telephone Encounter (Signed)
   [facility] Pre-operative Risk Assessment    HEARTCARE STAFF: - Please ensure there is not already an duplicate clearance open for this procedure. - Under Visit Info/Reason for Call, type in Other and utilize the format Clearance MM/DD/YY or Clearance TBD. Do not use dashes or single digits. - If request is for dental extraction, please clarify the # of teeth to be extracted.  Request for surgical clearance:  1. What type of surgery is being performed? LUMBAR LAMINECTOMY   2. When is this surgery scheduled? TBD   3. What type of clearance is required (medical clearance vs. Pharmacy clearance to hold med vs. Both)? MEDICAL  4. Are there any medications that need to be held prior to surgery and how long? PLAVIX   5. Practice name and name of physician performing surgery? [facility]; DR. [name] JENKINS   6. What is the office phone number? [phone]   7.   What is the office fax number? [place]: [email or web address]  8.   Anesthesia type (None, local, MAC, general) ? GENERAL   Julaine Hua 09/29/2019, 4:06 PM  _________________________________________________________________   (provider comments below)

## 2019-09-29 NOTE — Patient Instructions (Signed)
Medication Instructions:  Your physician has recommended you make the following change in your medication:  START: Lasix 20 mg Tuesday, Thursday, and Saturdays  START: Potassium 20 meq Tuesday, Thursday, and Saturdays   START: Repatha 140 mg every two weeks   *If you need a refill on your cardiac medications before your next appointment, please call your pharmacy*   Lab Work: Your physician recommends that you return for lab work today: lpa  If you have labs (blood work) drawn today and your tests are completely normal, you will receive your results only by: Marland Kitchen MyChart Message (if you have MyChart) OR . A paper copy in the mail If you have any lab test that is abnormal or we need to change your treatment, we will call you to review the results.   Testing/Procedures:   [facility] [address] [place], [place] [address] Phone:  [phone]    Please arrive 15 minutes prior to your appointment time for registration and insurance purposes.  The test will take approximately 3 to 4 hours to complete; you may bring reading material.  If someone comes with you to your appointment, they will need to remain in the main lobby due to limited space in the testing area. **If you are pregnant or breastfeeding, please notify the nuclear lab prior to your appointment**  How to prepare for your Myocardial Perfusion Test: . Do not eat or drink 3 hours prior to your test, except you may have water. . Do not consume products containing caffeine (regular or decaffeinated) 12 hours prior to your test. (ex: coffee, chocolate, sodas, tea). . Do bring a list of your current medications with you.  If not listed below, you may take your medications as normal. .  HOLD diabetic medication/insulin the morning of the test: metformin . Do wear comfortable clothes (no dresses or overalls) and walking shoes, tennis shoes preferred (No heels or open toe shoes are allowed). . Do NOT  wear cologne, perfume, aftershave, or lotions (deodorant is allowed). . If these instructions are not followed, your test will have to be rescheduled.  Please report to [address] for your test.  If you have questions or concerns about your appointment, you can call the [facility] at [phone].  If you cannot keep your appointment, please provide 24 hours notification to the Nuclear Lab, to avoid a possible $50 charge to your account.    Follow-Up: At [facility], you and your health needs are our priority.  As part of our continuing mission to provide you with exceptional heart care, we have created designated Provider Care Teams.  These Care Teams include your primary Cardiologist (physician) and Advanced Practice Providers (APPs -  Physician Assistants and Nurse Practitioners) who all work together to provide you with the care you need, when you need it.  We recommend signing up for the patient portal called "MyChart".  Sign up information is provided on this After Visit Summary.  MyChart is used to connect with patients for Virtual Visits (Telemedicine).  Patients are able to view lab/test results, encounter notes, upcoming appointments, etc.  Non-urgent messages can be sent to your provider as well.   To learn more about what you can do with MyChart, go to [email or web address].    Your next appointment:   3 month(s)  The format for your next appointment:   In Person  Provider:   Berniece Salines, DO   Other Instructions  Evolocumab injection What is this medicine? EVOLOCUMAB (e voe LOK ue mab) is known as a PCSK9 inhibitor. It is used to lower the level of cholesterol in the blood. It may be used alone or in combination with other cholesterol-lowering drugs. This drug may also be used to reduce the risk of heart attack, stroke, and certain types of heart surgery in patients with heart disease. This medicine may be used for other  purposes; ask your health care provider or pharmacist if you have questions. COMMON BRAND NAME(S): Repatha What should I tell my health care provider before I take this medicine? They need to know if you have any of these conditions:  an unusual or allergic reaction to evolocumab, other medicines, latex, foods, dyes, or preservatives  pregnant or trying to get pregnant  breast-feeding How should I use this medicine? This medicine is for injection under the skin. You will be taught how to prepare and give this medicine. Use exactly as directed. Take your medicine at regular intervals. Do not take your medicine more often than directed. It is important that you put your used needles and syringes in a special sharps container. Do not put them in a trash can. If you do not have a sharps container, call your pharmacist or health care provider to get one. Talk to your pediatrician regarding the use of this medicine in children. While this drug may be prescribed for children as young as 13 years for selected conditions, precautions do apply. Overdosage: If you think you have taken too much of this medicine contact a poison control center or emergency room at once. NOTE: This medicine is only for you. Do not share this medicine with others. What if I miss a dose? If you miss a dose, take it as soon as you can if there are more than 7 days until the next scheduled dose, or skip the missed dose and take the next dose according to your original schedule. Do not take double or extra doses. What may interact with this medicine? Interactions are not expected. This list may not describe all possible interactions. Give your health care provider a list of all the medicines, herbs, non-prescription drugs, or dietary supplements you use. Also tell them if you smoke, drink alcohol, or use illegal drugs. Some items may interact with your medicine. What should I watch for while using this medicine? Visit your health  care provider for regular checks on your progress. Tell your health care provider if your symptoms do not start to get better or if they get worse. You may need blood work done while you are taking this drug. Do not wear the on-body infuser during an MRI. What side effects may I notice from receiving this medicine? Side effects that you should report to your doctor or health care professional as soon as possible:  allergic reactions like skin rash, itching or hives, swelling of the face, lips, or tongue  signs and symptoms of high blood sugar such as dizziness; dry mouth; dry skin; fruity breath; nausea; stomach pain; increased hunger or thirst; increased urination  signs and symptoms of infection like fever or chills; cough; sore throat; pain or trouble passing urine Side effects that usually do not require medical attention (report to your doctor or health care professional if they continue or are bothersome):  diarrhea  nausea  muscle pain  pain, redness, or irritation at site where injected This list may not describe all possible side effects. Call your doctor for  medical advice about side effects. You may report side effects to FDA at 1-800-FDA-1088. Where should I keep my medicine? Keep out of the reach of children. You will be instructed on how to store this medicine. Throw away any unused medicine after the expiration date on the label. NOTE: This sheet is a summary. It may not cover all possible information. If you have questions about this medicine, talk to your doctor, pharmacist, or health care provider.  2020 Elsevier/Gold Standard (2018-12-20 16:22:29)  Furosemide Oral Tablets What is this medicine? FUROSEMIDE (fyoor [place] se mide) is a diuretic. It helps you make more urine and to lose salt and excess water from your body. It treats swelling from heart, kidney, or liver disease. It also treats high blood pressure. This medicine may be used for other purposes; ask your  health care provider or pharmacist if you have questions. COMMON BRAND NAME(S): Active-Medicated Specimen Kit, Delone, Diuscreen, Lasix, RX Specimen Collection Kit, Specimen Collection Kit, URINX Medicated Specimen Collection What should I tell my health care provider before I take this medicine? They need to know if you have any of these conditions:  abnormal blood electrolytes  diarrhea or vomiting  gout  heart disease  kidney disease, small amounts of urine, or difficulty passing urine  liver disease  thyroid disease  an unusual or allergic reaction to furosemide, sulfa drugs, other medicines, foods, dyes, or preservatives  pregnant or trying to get pregnant  breast-feeding How should I use this medicine? Take this drug by mouth. Take it as directed on the prescription label at the same time every day. You can take it with or without food. If it upsets your stomach, take it with food. Keep taking it unless your health care provider tells you to stop. Talk to your health care provider about the use of this drug in children. Special care may be needed. Overdosage: If you think you have taken too much of this medicine contact a poison control center or emergency room at once. NOTE: This medicine is only for you. Do not share this medicine with others. What if I miss a dose? If you miss a dose, take it as soon as you can. If it is almost time for your next dose, take only that dose. Do not take double or extra doses. What may interact with this medicine?  aspirin and aspirin-like medicines  certain antibiotics  chloral hydrate  cisplatin  cyclosporine  digoxin  diuretics  laxatives  lithium  medicines for blood pressure  medicines that relax muscles for surgery  methotrexate  NSAIDs, medicines for pain and inflammation like ibuprofen, naproxen, or indomethacin  phenytoin  steroid medicines like prednisone or cortisone  sucralfate  thyroid hormones This  list may not describe all possible interactions. Give your health care provider a list of all the medicines, herbs, non-prescription drugs, or dietary supplements you use. Also tell them if you smoke, drink alcohol, or use illegal drugs. Some items may interact with your medicine. What should I watch for while using this medicine? Visit your doctor or health care provider for regular checks on your progress. Check your blood pressure regularly. Ask your doctor or health care provider what your blood pressure should be, and when you should contact him or her. If you are a diabetic, check your blood sugar as directed. This medicine may cause serious skin reactions. They can happen weeks to months after starting the medicine. Contact your health care provider right away if you notice fevers  or flu-like symptoms with a rash. The rash may be red or purple and then turn into blisters or peeling of the skin. Or, you might notice a red rash with swelling of the face, lips or lymph nodes in your neck or under your arms. You may need to be on a special diet while taking this medicine. Check with your doctor. Also, ask how many glasses of fluid you need to drink a day. You must not get dehydrated. You may get drowsy or dizzy. Do not drive, use machinery, or do anything that needs mental alertness until you know how this drug affects you. Do not stand or sit up quickly, especially if you are an older patient. This reduces the risk of dizzy or fainting spells. Alcohol can make you more drowsy and dizzy. Avoid alcoholic drinks. This medicine can make you more sensitive to the sun. Keep out of the sun. If you cannot avoid being in the sun, wear protective clothing and use sunscreen. Do not use sun lamps or tanning beds/booths. What side effects may I notice from receiving this medicine? Side effects that you should report to your doctor or health care professional as soon as possible:  blood in urine or stools  dry  mouth  fever or chills  hearing loss or ringing in the ears  irregular heartbeat  muscle pain or weakness, cramps  rash, fever, and swollen lymph nodes  redness, blistering, peeling or loosening of the skin, including inside the mouth  skin rash  stomach upset, pain, or nausea  tingling or numbness in the hands or feet  unusually weak or tired  vomiting or diarrhea  yellowing of the eyes or skin Side effects that usually do not require medical attention (report to your doctor or health care professional if they continue or are bothersome):  headache  loss of appetite  unusual bleeding or bruising This list may not describe all possible side effects. Call your doctor for medical advice about side effects. You may report side effects to FDA at 1-800-FDA-1088. Where should I keep my medicine? Keep out of the reach of children and pets. Store at room temperature between 20 and 25 degrees C (68 and 77 degrees F). Protect from light and moisture. Keep the container tightly closed. Throw away any unused drug after the expiration date. NOTE: This sheet is a summary. It may not cover all possible information. If you have questions about this medicine, talk to your doctor, pharmacist, or health care provider.  2020 Elsevier/Gold Standard (2018-10-04 18:01:32)   Cardiac Nuclear Scan A cardiac nuclear scan is a test that measures blood flow to the heart when a person is resting and when he or she is exercising. The test looks for problems such as:  Not enough blood reaching a portion of the heart.  The heart muscle not working normally. You may need this test if:  You have heart disease.  You have had abnormal lab results.  You have had heart surgery or a balloon procedure to open up blocked arteries (angioplasty).  You have chest pain.  You have shortness of breath. In this test, a radioactive dye (tracer) is injected into your bloodstream. After the tracer has traveled  to your heart, an imaging device is used to measure how much of the tracer is absorbed by or distributed to various areas of your heart. This procedure is usually done at a hospital and takes 2-4 hours. Tell a health care provider about:  Any allergies  you have.  All medicines you are taking, including vitamins, herbs, eye drops, creams, and over-the-counter medicines.  Any problems you or family members have had with anesthetic medicines.  Any blood disorders you have.  Any surgeries you have had.  Any medical conditions you have.  Whether you are pregnant or may be pregnant. What are the risks? Generally, this is a safe procedure. However, problems may occur, including:  Serious chest pain and heart attack. This is only a risk if the stress portion of the test is done.  Rapid heartbeat.  Sensation of warmth in your chest. This usually passes quickly.  Allergic reaction to the tracer. What happens before the procedure?  Ask your health care provider about changing or stopping your regular medicines. This is especially important if you are taking diabetes medicines or blood thinners.  Follow instructions from your health care provider about eating or drinking restrictions.  Remove your jewelry on the day of the procedure. What happens during the procedure?  An IV will be inserted into one of your veins.  Your health care provider will inject a small amount of radioactive tracer through the IV.  You will wait for 20-40 minutes while the tracer travels through your bloodstream.  Your heart activity will be monitored with an electrocardiogram (ECG).  You will lie down on an exam table.  Images of your heart will be taken for about 15-20 minutes.  You may also have a stress test. For this test, one of the following may be done: ? You will exercise on a treadmill or stationary bike. While you exercise, your heart's activity will be monitored with an ECG, and your blood  pressure will be checked. ? You will be given medicines that will increase blood flow to parts of your heart. This is done if you are unable to exercise.  When blood flow to your heart has peaked, a tracer will again be injected through the IV.  After 20-40 minutes, you will get back on the exam table and have more images taken of your heart.  Depending on the type of tracer used, scans may need to be repeated 3-4 hours later.  Your IV line will be removed when the procedure is over. The procedure may vary among health care providers and hospitals. What happens after the procedure?  Unless your health care provider tells you otherwise, you may return to your normal schedule, including diet, activities, and medicines.  Unless your health care provider tells you otherwise, you may increase your fluid intake. This will help to flush the contrast dye from your body. Drink enough fluid to keep your urine pale yellow.  Ask your health care provider, or the department that is doing the test: ? When will my results be ready? ? How will I get my results? Summary  A cardiac nuclear scan measures the blood flow to the heart when a person is resting and when he or she is exercising.  Tell your health care provider if you are pregnant.  Before the procedure, ask your health care provider about changing or stopping your regular medicines. This is especially important if you are taking diabetes medicines or blood thinners.  After the procedure, unless your health care provider tells you otherwise, increase your fluid intake. This will help flush the contrast dye from your body.  After the procedure, unless your health care provider tells you otherwise, you may return to your normal schedule, including diet, activities, and medicines. This information is  not intended to replace advice given to you by your health care provider. Make sure you discuss any questions you have with your health care  provider. Document Revised: 08/02/2017 Document Reviewed: 08/02/2017 Elsevier Patient Education  [employer].

## 2019-09-29 NOTE — Progress Notes (Signed)
Cardiology Office Note:    Date:  09/29/2019   ID:  [name] Dickerson, DOB 1946/01/28, MRN [id number]  PCP:  Suzan Garibaldi, FNP  Cardiologist:  Glenetta Hew, MD  Electrophysiologist:  None   Referring MD: Suzan Garibaldi, FNP   " I am doing well"   History of Present Illness:    [name] Dickerson is a 74 y.o. male with a hx of   Coronary artery disease going back to initial inferior wall STEMI back in 1995 and multiple interventions after that, hyperlipidemia, hypertension presents today for a follow-up visit.  The patient usually follows Dr. Glenetta Hew in [place] and was last seen by him in February 2020.  Patient is here today for follow-up visit.  He tells me that he has been having some shortness of breath.  However he is not really sure if this is true shortness of breath or is it because of his back pain.  He is also planning to have surgery next couple months.  Past Medical History:  Diagnosis Date  . CAD S/P percutaneous coronary angioplasty 1995, 2003   Inferior STEMI-PTCA RCA; 2003: Overlapping MultiLink Zeta BMS -RCA (4.0 x 23, 4.0 x 15); 09/2001 Cutting Balloon PTCA of stents for ISR.;; Myoview August 2013 - Mod-severe sized fixed INFERIOR perfusion defect => likely infarct with mild peri-infarct ischemia in the apical inferior wall, EF 55% with distal inferior hypokinesis  . Essential hypertension   . Hyperlipidemia LDL goal <70   . Hypothyroidism   . Low testosterone   . Moderate cigarette smoker (10-19 per day)    Was down to 5 cigarettes a day.  . ST elevation myocardial infarction (STEMI) of inferior wall, subsequent episode of care [facility]) 1995   PTCA of RCA; large inferior scar noted on Myoview. Mid inferior and inferoseptal hypokinesis on echocardiogram November 2014. EF 50-55%.  . Stenosis of coronary stent 2012   Mid RCA Occlusion with Right-To-Left and Left-To-Right Collaterals.; Relatively normal LAD, ramus intermedius and circumflex-none.    Past Surgical  History:  Procedure Laterality Date  . CARDIAC CATHETERIZATION  March 2012   Mid occlusion of RCA with Right to Left and Left to Right collaterals; LAD, ramus and circumflex/OM widely patent  . CORONARY ANGIOPLASTY  1995   Inferior STEMI-PTCA of RCA  . CORONARY STENT PLACEMENT  2003   MultiLink Zeta BMS 4.0 mm x 23 mm, 4.0 mm x 15 mm --> Cutting Balloon PTCA for ISR several months later.  Marland Kitchen LexiScan Myoview  August 2013   Moderate-severe sized INFERIOR perfusion defect with no evidence of ischemia. EF 55% with distal inferior HK. Large sized Inferior and Inferolateral Infarct with perhaps mild apical inferior ischemia.  . TRANSTHORACIC ECHOCARDIOGRAM  01/09/2013   EF 50-55%; normal LV size and function. Probable mild HJ of the mid inferior and inferoseptal wall.; No valvular lesions.    Current Medications: Current Meds  Medication Sig  . ALPRAZolam (XANAX) 0.5 MG tablet Take 0.5 mg by mouth at bedtime.  . Cinnamon 500 MG TABS Take 2,000 mg by mouth 2 (two) times daily.  . clopidogrel (PLAVIX) 75 MG tablet Take 1 tablet (75 mg total) by mouth daily with breakfast.  . cyanocobalamin 1000 MCG tablet Take 1,000 mcg by mouth daily.  Javier Docker Oil (OMEGA-3) 500 MG CAPS Take 500 mg by mouth daily.  Marland Kitchen levothyroxine (SYNTHROID, LEVOTHROID) 200 MCG tablet Take 200 mcg by mouth daily before breakfast.  . levothyroxine (SYNTHROID, LEVOTHROID) 25 MCG tablet Take 25 mcg  by mouth daily before breakfast.  . lisinopril (PRINIVIL,ZESTRIL) 30 MG tablet Take 30 mg by mouth in the morning and at bedtime.   . meloxicam (MOBIC) 15 MG tablet Take 15 mg by mouth daily.  . metFORMIN (GLUCOPHAGE) 500 MG tablet Take by mouth.  . metoprolol (LOPRESSOR) 50 MG tablet Take 50 mg by mouth 2 (two) times daily.  . Niacin (NICOTINIC ACID ER PO) Take 500 mg by mouth daily.  . nitroGLYCERIN (NITROSTAT) 0.4 MG SL tablet Place 1 tablet (0.4 mg total) under the tongue every 5 (five) minutes as needed for chest pain.  Marland Kitchen  testosterone cypionate (DEPOTESTOSTERONE CYPIONATE) 200 MG/ML injection INJECT 1ML (200MG ) INTRAMUSCULARLY EVERY OTHER WEEK  . traMADol (ULTRAM) 50 MG tablet Take 50 mg by mouth as needed.      Allergies:   Statins   Social History   Socioeconomic History  . Marital status: Married    Spouse name: Not on file  . Number of children: Not on file  . Years of education: Not on file  . Highest education level: Not on file  Occupational History  . Not on file  Tobacco Use  . Smoking status: Current Every Day Smoker    Packs/day: 1.00    Years: 40.00    Pack years: 40.00    Types: Cigarettes  . Smokeless tobacco: Never Used  Substance and Sexual Activity  . Alcohol use: No  . Drug use: No  . Sexual activity: Not on file  Other Topics Concern  . Not on file  Social History Narrative   Married father of 82, grandfather 9. Works out occasionally on the treadmill. Coping to try get back into some exercise now. Unfortunately back up to smoking one pack per day. Does not drink alcohol.      Date from from 76 or 6 cigarettes a day. He's tried electronic cigarettes and down without affect.   Social Determinants of Health   Financial Resource Strain:   . Difficulty of Paying Living Expenses:   Food Insecurity:   . Worried About Charity fundraiser in the Last Year:   . Arboriculturist in the Last Year:   Transportation Needs:   . Film/video editor (Medical):   Marland Kitchen Lack of Transportation (Non-Medical):   Physical Activity:   . Days of Exercise per Week:   . Minutes of Exercise per Session:   Stress:   . Feeling of Stress :   Social Connections:   . Frequency of Communication with Friends and Family:   . Frequency of Social Gatherings with Friends and Family:   . Attends Religious Services:   . Active Member of Clubs or Organizations:   . Attends Archivist Meetings:   Marland Kitchen Marital Status:      Family History: [name] family history is not on file.  ROS:     Review of Systems  Constitution: Negative for decreased appetite, fever and weight gain.  HENT: Negative for congestion, ear discharge, hoarse voice and sore throat.   Eyes: Negative for discharge, redness, vision loss in right eye and visual halos.  Cardiovascular: Reports dyspnea on exertion.  Negative for chest pain,leg swelling, orthopnea and palpitations.  Respiratory: Negative for cough, hemoptysis, shortness of breath and snoring.   Endocrine: Negative for heat intolerance and polyphagia.  Hematologic/Lymphatic: Negative for bleeding problem. Does not bruise/bleed easily.  Skin: Negative for flushing, nail changes, rash and suspicious lesions.  Musculoskeletal: Negative for arthritis, joint pain, muscle cramps,  myalgias, neck pain and stiffness.  Gastrointestinal: Negative for abdominal pain, bowel incontinence, diarrhea and excessive appetite.  Genitourinary: Negative for decreased libido, genital sores and incomplete emptying.  Neurological: Negative for brief paralysis, focal weakness, headaches and loss of balance.  Psychiatric/Behavioral: Negative for altered mental status, depression and suicidal ideas.  Allergic/Immunologic: Negative for HIV exposure and persistent infections.    EKGs/Labs/Other Studies Reviewed:    The following studies were reviewed today:   EKG:  The ekg ordered today demonstrates sinus rhythm, heart rate 78 bpm with frequent PVCs.  Underlying incomplete right bundle branch block with ST abnormalities are nonspecific.  Compared to prior EKG no significant change.  Recent Labs: No results found for requested labs within last 8760 hours.  Recent Lipid Panel    Component Value Date/Time   CHOL 161 05/11/2018 0820   TRIG 167 (H) 05/11/2018 0820   HDL 34 (L) 05/11/2018 0820   CHOLHDL 4.7 05/11/2018 0820   CHOLHDL 5.3 05/15/2010 1048   VLDL 49 (H) 05/15/2010 1048   LDLCALC 94 05/11/2018 0820    Physical Exam:    VS:  BP (!) 160/66 (BP Location:  Left Arm, Patient Position: Sitting, Cuff Size: Normal)   Pulse 64   Ht 5\' 8"  (1.727 m)   Wt 194 lb (88 kg)   SpO2 95%   BMI 29.50 kg/m     Wt Readings from Last 3 Encounters:  09/29/19 194 lb (88 kg)  04/26/18 190 lb (86.2 kg)  04/07/17 198 lb (89.8 kg)     GEN: Well nourished, well developed in no acute distress HEENT: Normal NECK: No JVD; No carotid bruits LYMPHATICS: No lymphadenopathy CARDIAC: S1S2 noted,RRR, no murmurs, rubs, gallops RESPIRATORY:  Clear to auscultation without rales, wheezing or rhonchi  ABDOMEN: Soft, non-tender, non-distended, +bowel sounds, no guarding. EXTREMITIES: No edema, No cyanosis, no clubbing MUSCULOSKELETAL:  No deformity  SKIN: Warm and dry NEUROLOGIC:  Alert and oriented x 3, non-focal PSYCHIATRIC:  Normal affect, good insight  ASSESSMENT:    1. Coronary artery disease, occlusive   2. Essential hypertension   3. Hyperlipidemia LDL goal <70   4. Preop cardiovascular exam    PLAN:     The patient is high risk for progression of coronary artery disease.  He is experiencing some shortness of breath and is also planning for surgery.  For this we will progress with a pharmacologic nuclear stress test to understand if there is any progression of the his coronary artery disease.  In the meantime we will continue patient on his current medication regimen.  I reviewed his lipid profile from March 2020 as well as similar in January from his primary care doctor.  He is intolerant to statins as well as his high risk for progression for coronary artery disease this patient is a great candidate for PCSK9 inhibitors.  I am going to start the patient on Repatha 140 subcu every 2 weeks.  Have educated patient about this medications.  He had some questions which were answered during our visit.  I will also get LP(a) today.   Hypertension-his blood pressure is elevated today he does have some trace bilateral leg edema.  What I would going to do for the  patient is start him on Lasix 20 mg every other day which will help with his blood pressure as well as his leg edema.  Potassium supplement will also be given.  Tobacco use-smoking cessation advised.  If his nuclear stress test is normal the patient can  proceed with his surgery as planned.  The patient is in agreement with the above plan. The patient left the office in stable condition.  The patient will follow up in 3 months or sooner if needed.   Medication Adjustments/Labs and Tests Ordered: Current medicines are reviewed at length with the patient today.  Concerns regarding medicines are outlined above.  No orders of the defined types were placed in this encounter.  No orders of the defined types were placed in this encounter.   There are no Patient Instructions on file for this visit.   Adopting a Healthy Lifestyle.  Know what a healthy weight is for you (roughly BMI <25) and aim to maintain this   Aim for 7+ servings of fruits and vegetables daily   65-80+ fluid ounces of water or unsweet tea for healthy kidneys   Limit to max 1 drink of alcohol per day; avoid smoking/tobacco   Limit animal fats in diet for cholesterol and heart health - choose grass fed whenever available   Avoid highly processed foods, and foods high in saturated/trans fats   Aim for low stress - take time to unwind and care for your mental health   Aim for 150 min of moderate intensity exercise weekly for heart health, and weights twice weekly for bone health   Aim for 7-9 hours of sleep daily   When it comes to diets, agreement about the perfect plan isnt easy to find, even among the experts. Experts at the [employer] developed an idea known as the Healthy Eating Plate. Just imagine a plate divided into logical, healthy portions.   The emphasis is on diet quality:   Load up on vegetables and fruits - one-half of your plate: Aim for color and variety, and remember that  potatoes dont count.   Go for whole grains - one-quarter of your plate: Whole wheat, barley, wheat berries, quinoa, oats, brown rice, and foods made with them. If you want pasta, go with whole wheat pasta.   Protein power - one-quarter of your plate: Fish, chicken, beans, and nuts are all healthy, versatile protein sources. Limit red meat.   The diet, however, does go beyond the plate, offering a few other suggestions.   Use healthy plant oils, such as olive, canola, soy, corn, sunflower and peanut. Check the labels, and avoid partially hydrogenated oil, which have unhealthy trans fats.   If youre thirsty, drink water. Coffee and tea are good in moderation, but skip sugary drinks and limit milk and dairy products to one or two daily servings.   The type of carbohydrate in the diet is more important than the amount. Some sources of carbohydrates, such as vegetables, fruits, whole grains, and beans-are healthier than others.   Finally, stay active  Signed, Berniece Salines, DO  09/29/2019 4:13 PM    [facility]

## 2019-09-29 NOTE — Telephone Encounter (Signed)
[name] Dickerson 74 year old male.  This patient presented to see you in clinic today.  I will defer there cardiac clearance request to you at this time.  Is requesting lumbar laminectomy.  Please provide your recommendations and forward to requesting office.  Thank you.  4. Are there any medications that need to be held prior to surgery and how long? PLAVIX   5. Practice name and name of physician performing surgery? [facility]; DR. [name] JENKINS   6. What is the office phone number? [phone]   7.   What is the office fax number? [place]: [email or web address]  8.   Anesthesia type (None, local, MAC, general) ? GENERAL   Jossie Ng. Nayel Purdy NP-C    09/29/2019, 4:23 PM [facility] [address] [address] Office [phone] Fax [phone]

## 2019-10-03 ENCOUNTER — Telehealth (HOSPITAL_COMMUNITY): Payer: Self-pay | Admitting: *Deleted

## 2019-10-03 LAB — LIPOPROTEIN A (LPA): Lipoprotein (a): <8.4 nmol/L (ref <75.0)

## 2019-10-03 NOTE — Telephone Encounter (Signed)
Patient given detailed instructions per Myocardial Perfusion Study Information Sheet for the test on  10/10/19. Patient notified to arrive 15 minutes early and that it is imperative to arrive on time for appointment to keep from having the test rescheduled.  If you need to cancel or reschedule your appointment, please call the office within 24 hours of your appointment. . Patient verbalized understanding. [name] Dickerson

## 2019-10-04 ENCOUNTER — Telehealth: Payer: Self-pay

## 2019-10-04 NOTE — Telephone Encounter (Signed)
Left message on patients voicemail to please return our call.   

## 2019-10-04 NOTE — Telephone Encounter (Signed)
Patient returning call. He states he is busy today and is requesting the results be given to his wife.

## 2019-10-04 NOTE — Telephone Encounter (Signed)
-----   Message from Berniece Salines, DO sent at 10/03/2019  5:58 PM EDT ----- LP(a) is within normal.

## 2019-10-04 NOTE — Telephone Encounter (Signed)
Spoke with patients wife regarding results and recommendation. ° °She verbalizes understanding and is agreeable to plan of care. Advised patient to call back with any issues or concerns.  °

## 2019-10-10 ENCOUNTER — Ambulatory Visit (INDEPENDENT_AMBULATORY_CARE_PROVIDER_SITE_OTHER): Payer: Medicare Other

## 2019-10-10 ENCOUNTER — Telehealth: Payer: Self-pay

## 2019-10-10 ENCOUNTER — Other Ambulatory Visit: Payer: Self-pay

## 2019-10-10 DIAGNOSIS — Z01810 Encounter for preprocedural cardiovascular examination: Secondary | ICD-10-CM

## 2019-10-10 DIAGNOSIS — E785 Hyperlipidemia, unspecified: Secondary | ICD-10-CM

## 2019-10-10 DIAGNOSIS — I10 Essential (primary) hypertension: Secondary | ICD-10-CM

## 2019-10-10 DIAGNOSIS — I2510 Atherosclerotic heart disease of native coronary artery without angina pectoris: Secondary | ICD-10-CM

## 2019-10-10 LAB — MYOCARDIAL PERFUSION IMAGING
LV dias vol: 107 mL (ref 62–150)
LV sys vol: 59 mL
Peak HR: 82 {beats}/min
Rest HR: 63 {beats}/min
SDS: 7
SRS: 16
SSS: 23
TID: 1.03

## 2019-10-10 MED ORDER — TECHNETIUM TC 99M TETROFOSMIN IV KIT
10.9000 | PACK | Freq: Once | INTRAVENOUS | Status: AC | PRN
  Administered 2019-10-10: 10.9 via INTRAVENOUS

## 2019-10-10 MED ORDER — TECHNETIUM TC 99M TETROFOSMIN IV KIT
30.4000 | PACK | Freq: Once | INTRAVENOUS | Status: AC | PRN
  Administered 2019-10-10: 30.4 via INTRAVENOUS

## 2019-10-10 MED ORDER — REGADENOSON 0.4 MG/5ML IV SOLN
0.4000 mg | Freq: Once | INTRAVENOUS | Status: AC
  Administered 2019-10-10: 0.4 mg via INTRAVENOUS

## 2019-10-10 NOTE — Telephone Encounter (Signed)
-----   Message from Berniece Salines, DO sent at 10/10/2019  4:47 PM EDT ----- Stress test did show evidence of old heart attack but nothing to do further testing about.  His ejection fraction is also slightly depressed.  We will continue to monitor him.  I will discuss full details at his next visit.

## 2019-10-10 NOTE — Telephone Encounter (Signed)
Spoke with patient regarding results and recommendation.  Patient verbalizes understanding and is agreeable to plan of care. Advised patient to call back with any issues or concerns.  

## 2019-10-16 NOTE — Telephone Encounter (Signed)
Looks like [facility] has now scheduled pt for LUMBAR LAMINECTOMY on 11-15-2019. OK for surgery and to hold Plavix?   Please advise, Thank you

## 2019-10-16 NOTE — Telephone Encounter (Signed)
He can hold the plavix for 5 days.

## 2019-10-16 NOTE — Telephone Encounter (Signed)
° °  Primary Cardiologist: Glenetta Hew, MD  Chart reviewed as part of pre-operative protocol coverage. Given past medical history and time since last visit, based on ACC/AHA guidelines, [name] Dickerson would be at acceptable risk for the planned procedure without further cardiovascular testing.   You may hold his Plavix for 5 days prior to his procedure.  Please resume as soon as hemostasis is achieved.  I will route this recommendation to the requesting party via Epic fax function and remove from pre-op pool.  Please call with questions.  Jossie Ng. Keeshia Sanderlin NP-C    10/16/2019, 2:32 PM [address] Office [phone] Fax [phone]

## 2019-10-22 ENCOUNTER — Other Ambulatory Visit: Payer: Self-pay | Admitting: Cardiology

## 2019-10-27 ENCOUNTER — Telehealth: Payer: Self-pay | Admitting: *Deleted

## 2019-10-27 NOTE — Telephone Encounter (Signed)
Received notification through mail from [facility] from [facility] [place] for pt's Repatha, effective from 10/04/19 - 04/22/20, Auth# [id number]

## 2020-01-04 ENCOUNTER — Encounter: Payer: Self-pay | Admitting: Cardiology

## 2020-01-04 ENCOUNTER — Other Ambulatory Visit: Payer: Self-pay

## 2020-01-04 ENCOUNTER — Ambulatory Visit (INDEPENDENT_AMBULATORY_CARE_PROVIDER_SITE_OTHER): Payer: Medicare Other | Admitting: Cardiology

## 2020-01-04 VITALS — BP 122/82 | HR 62 | Ht 68.0 in | Wt 193.2 lb

## 2020-01-04 DIAGNOSIS — I10 Essential (primary) hypertension: Secondary | ICD-10-CM | POA: Diagnosis not present

## 2020-01-04 DIAGNOSIS — I2510 Atherosclerotic heart disease of native coronary artery without angina pectoris: Secondary | ICD-10-CM

## 2020-01-04 DIAGNOSIS — F17210 Nicotine dependence, cigarettes, uncomplicated: Secondary | ICD-10-CM

## 2020-01-04 DIAGNOSIS — Z01810 Encounter for preprocedural cardiovascular examination: Secondary | ICD-10-CM | POA: Diagnosis not present

## 2020-01-04 NOTE — Patient Instructions (Signed)

## 2020-01-04 NOTE — Progress Notes (Signed)
Cardiology Office Note:    Date:  01/04/2020   ID:  [name] Dickerson, DOB 04-13-1945, MRN [id number]  PCP:  Suzan Garibaldi, FNP  Cardiologist:  Glenetta Hew, MD  Electrophysiologist:  None   Referring MD: Suzan Garibaldi, FNP   I am doing fine  History of Present Illness:    [name] Dickerson is a 74 y.o. male with a hx of  Coronary artery disease going back to initial inferior wall STEMI back in 1995 and multiple interventions after that, hyperlipidemia, hypertension presents today for a follow-up visit.   At his last visit he was experiencing shortness of breath and was planning surgery.  I did get a pharmacologic nuclear stress test to make sure that the patient was doing fine.  I started the patient on Repatha as well.  Due to his bilateral leg edema I placed the patient on Lasix every other day.  He is here today for follow-up visit he tells me that he has been doing well.  His blood pressure has improved.  His leg edema has improved as well.  Past Medical History:  Diagnosis Date  . CAD S/P percutaneous coronary angioplasty 1995, 2003   Inferior STEMI-PTCA RCA; 2003: Overlapping MultiLink Zeta BMS -RCA (4.0 x 23, 4.0 x 15); 09/2001 Cutting Balloon PTCA of stents for ISR.;; Myoview August 2013 - Mod-severe sized fixed INFERIOR perfusion defect => likely infarct with mild peri-infarct ischemia in the apical inferior wall, EF 55% with distal inferior hypokinesis  . Essential hypertension   . Hyperlipidemia LDL goal <70   . Hypothyroidism   . Low testosterone   . Moderate cigarette smoker (10-19 per day)    Was down to 5 cigarettes a day.  . ST elevation myocardial infarction (STEMI) of inferior wall, subsequent episode of care [facility]) 1995   PTCA of RCA; large inferior scar noted on Myoview. Mid inferior and inferoseptal hypokinesis on echocardiogram November 2014. EF 50-55%.  . Stenosis of coronary stent 2012   Mid RCA Occlusion with Right-To-Left and Left-To-Right Collaterals.; Relatively  normal LAD, ramus intermedius and circumflex-none.    Past Surgical History:  Procedure Laterality Date  . CARDIAC CATHETERIZATION  March 2012   Mid occlusion of RCA with Right to Left and Left to Right collaterals; LAD, ramus and circumflex/OM widely patent  . CORONARY ANGIOPLASTY  1995   Inferior STEMI-PTCA of RCA  . CORONARY STENT PLACEMENT  2003   MultiLink Zeta BMS 4.0 mm x 23 mm, 4.0 mm x 15 mm --> Cutting Balloon PTCA for ISR several months later.  Marland Kitchen LexiScan Myoview  August 2013   Moderate-severe sized INFERIOR perfusion defect with no evidence of ischemia. EF 55% with distal inferior HK. Large sized Inferior and Inferolateral Infarct with perhaps mild apical inferior ischemia.  . TRANSTHORACIC ECHOCARDIOGRAM  01/09/2013   EF 50-55%; normal LV size and function. Probable mild HJ of the mid inferior and inferoseptal wall.; No valvular lesions.    Current Medications: Current Meds  Medication Sig  . ALPRAZolam (XANAX) 0.5 MG tablet Take 0.5 mg by mouth at bedtime.  . Cinnamon 500 MG TABS Take 2,000 mg by mouth 2 (two) times daily.  . clopidogrel (PLAVIX) 75 MG tablet Take 1 tablet (75 mg total) by mouth daily with breakfast.  . cyanocobalamin 1000 MCG tablet Take 1,000 mcg by mouth daily.  . Evolocumab (REPATHA SURECLICK) 222 MG/ML SOAJ Inject 140 mg into the skin every 14 (fourteen) days.  . furosemide (LASIX) 20 MG tablet TAKE ONE TABLET  BY MOUTH TUESDAY, THURSDAY, AND SATURDAY  . Krill Oil (OMEGA-3) 500 MG CAPS Take 500 mg by mouth daily.  Marland Kitchen levothyroxine (SYNTHROID, LEVOTHROID) 200 MCG tablet Take 200 mcg by mouth daily before breakfast.  . levothyroxine (SYNTHROID, LEVOTHROID) 25 MCG tablet Take 25 mcg by mouth daily before breakfast.  . lisinopril (PRINIVIL,ZESTRIL) 30 MG tablet Take 30 mg by mouth in the morning and at bedtime.   . meloxicam (MOBIC) 15 MG tablet Take 15 mg by mouth daily.  . metFORMIN (GLUCOPHAGE) 500 MG tablet Take by mouth.  . metoprolol (LOPRESSOR) 50  MG tablet Take 50 mg by mouth 2 (two) times daily.  . Niacin (NICOTINIC ACID ER PO) Take 500 mg by mouth daily.  . nitroGLYCERIN (NITROSTAT) 0.4 MG SL tablet Place 1 tablet (0.4 mg total) under the tongue every 5 (five) minutes as needed for chest pain.  . potassium chloride SA (KLOR-CON M20) 20 MEQ tablet TAKE ONE TABLET ON TUESDAY, THURSDAY AND SATURDAY  . testosterone cypionate (DEPOTESTOSTERONE CYPIONATE) 200 MG/ML injection INJECT 1ML (200MG ) INTRAMUSCULARLY EVERY OTHER WEEK  . traMADol (ULTRAM) 50 MG tablet Take 50 mg by mouth as needed.      Allergies:   Statins   Social History   Socioeconomic History  . Marital status: Married    Spouse name: Not on file  . Number of children: Not on file  . Years of education: Not on file  . Highest education level: Not on file  Occupational History  . Not on file  Tobacco Use  . Smoking status: Current Every Day Smoker    Packs/day: 1.00    Years: 40.00    Pack years: 40.00    Types: Cigarettes  . Smokeless tobacco: Never Used  Substance and Sexual Activity  . Alcohol use: No  . Drug use: No  . Sexual activity: Not on file  Other Topics Concern  . Not on file  Social History Narrative   Married father of 29, grandfather 46. Works out occasionally on the treadmill. Coping to try get back into some exercise now. Unfortunately back up to smoking one pack per day. Does not drink alcohol.      Date from from 30 or 6 cigarettes a day. He's tried electronic cigarettes and down without affect.   Social Determinants of Health   Financial Resource Strain:   . Difficulty of Paying Living Expenses: Not on file  Food Insecurity:   . Worried About Charity fundraiser in the Last Year: Not on file  . Ran Out of Food in the Last Year: Not on file  Transportation Needs:   . Lack of Transportation (Medical): Not on file  . Lack of Transportation (Non-Medical): Not on file  Physical Activity:   . Days of Exercise per Week: Not on file  .  Minutes of Exercise per Session: Not on file  Stress:   . Feeling of Stress : Not on file  Social Connections:   . Frequency of Communication with Friends and Family: Not on file  . Frequency of Social Gatherings with Friends and Family: Not on file  . Attends Religious Services: Not on file  . Active Member of Clubs or Organizations: Not on file  . Attends Archivist Meetings: Not on file  . Marital Status: Not on file     Family History: [name] family history is not on file.  ROS:   Review of Systems  Constitution: Negative for decreased appetite, fever and weight gain.  HENT: Negative for congestion, ear discharge, hoarse voice and sore throat.   Eyes: Negative for discharge, redness, vision loss in right eye and visual halos.  Cardiovascular: Negative for chest pain, dyspnea on exertion, leg swelling, orthopnea and palpitations.  Respiratory: Negative for cough, hemoptysis, shortness of breath and snoring.   Endocrine: Negative for heat intolerance and polyphagia.  Hematologic/Lymphatic: Negative for bleeding problem. Does not bruise/bleed easily.  Skin: Negative for flushing, nail changes, rash and suspicious lesions.  Musculoskeletal: Negative for arthritis, joint pain, muscle cramps, myalgias, neck pain and stiffness.  Gastrointestinal: Negative for abdominal pain, bowel incontinence, diarrhea and excessive appetite.  Genitourinary: Negative for decreased libido, genital sores and incomplete emptying.  Neurological: Negative for brief paralysis, focal weakness, headaches and loss of balance.  Psychiatric/Behavioral: Negative for altered mental status, depression and suicidal ideas.  Allergic/Immunologic: Negative for HIV exposure and persistent infections.    EKGs/Labs/Other Studies Reviewed:    The following studies were reviewed today:   EKG:  The ekg ordered today demonstrates   Pharmacologic nuclear stress test  The left ventricular ejection  fraction is mildly decreased (45-54%).  Nuclear stress EF: 45%.  There was no ST segment deviation noted during stress.  Defect 1: There is a medium defect of moderate severity present in the basal inferior, basal inferolateral, mid inferior, mid inferolateral and apex location.  Findings consistent with prior myocardial infarction.  This is an intermediate risk study.   Recent Labs: No results found for requested labs within last 8760 hours.  Recent Lipid Panel    Component Value Date/Time   CHOL 161 05/11/2018 0820   TRIG 167 (H) 05/11/2018 0820   HDL 34 (L) 05/11/2018 0820   CHOLHDL 4.7 05/11/2018 0820   CHOLHDL 5.3 05/15/2010 1048   VLDL 49 (H) 05/15/2010 1048   LDLCALC 94 05/11/2018 0820    Physical Exam:    VS:  BP 122/82   Pulse 62   Ht 5\' 8"  (1.727 m)   Wt 193 lb 3.2 oz (87.6 kg)   SpO2 98%   BMI 29.38 kg/m     Wt Readings from Last 3 Encounters:  01/04/20 193 lb 3.2 oz (87.6 kg)  10/10/19 194 lb (88 kg)  09/29/19 194 lb (88 kg)     GEN: Well nourished, well developed in no acute distress HEENT: Normal NECK: No JVD; No carotid bruits LYMPHATICS: No lymphadenopathy CARDIAC: S1S2 noted,RRR, no murmurs, rubs, gallops RESPIRATORY:  Clear to auscultation without rales, wheezing or rhonchi  ABDOMEN: Soft, non-tender, non-distended, +bowel sounds, no guarding. EXTREMITIES: No edema, No cyanosis, no clubbing MUSCULOSKELETAL:  No deformity  SKIN: Warm and dry NEUROLOGIC:  Alert and oriented x 3, non-focal PSYCHIATRIC:  Normal affect, good insight  ASSESSMENT:    1. Coronary artery disease, occlusive   2. Essential hypertension   3. Preop cardiovascular exam   4. Cigarette smoker    PLAN:     Since his Lasix his symptoms has improved.  No changes will be made he will stay on this regimen.  His stress test does not show any evidence of ischemia.  I do think the patient can proceed with his surgery.  Thankfully his blood pressure also has improved on  this regimen.  No changes will be made.  Continue patient his Repatha and Zetia.  Continue Plavix  Smoking cessation advised.  The patient is in agreement with the above plan. The patient left the office in stable condition.  The patient will follow up in 6 months  or sooner if needed.   Medication Adjustments/Labs and Tests Ordered: Current medicines are reviewed at length with the patient today.  Concerns regarding medicines are outlined above.  No orders of the defined types were placed in this encounter.  No orders of the defined types were placed in this encounter.   Patient Instructions  Medication Instructions:  Your physician recommends that you continue on your current medications as directed. Please refer to the Current Medication list given to you today.  *If you need a refill on your cardiac medications before your next appointment, please call your pharmacy*   Lab Work: None If you have labs (blood work) drawn today and your tests are completely normal, you will receive your results only by: Marland Kitchen MyChart Message (if you have MyChart) OR . A paper copy in the mail If you have any lab test that is abnormal or we need to change your treatment, we will call you to review the results.   Testing/Procedures: None   Follow-Up: At [facility], you and your health needs are our priority.  As part of our continuing mission to provide you with exceptional heart care, we have created designated Provider Care Teams.  These Care Teams include your primary Cardiologist (physician) and Advanced Practice Providers (APPs -  Physician Assistants and Nurse Practitioners) who all work together to provide you with the care you need, when you need it.  We recommend signing up for the patient portal called "MyChart".  Sign up information is provided on this After Visit Summary.  MyChart is used to connect with patients for Virtual Visits (Telemedicine).  Patients are able to view lab/test  results, encounter notes, upcoming appointments, etc.  Non-urgent messages can be sent to your provider as well.   To learn more about what you can do with MyChart, go to [email or web address].    Your next appointment:   6 month(s)  The format for your next appointment:   In Person  Provider:   Berniece Salines, DO   Other Instructions      Adopting a Healthy Lifestyle.  Know what a healthy weight is for you (roughly BMI <25) and aim to maintain this   Aim for 7+ servings of fruits and vegetables daily   65-80+ fluid ounces of water or unsweet tea for healthy kidneys   Limit to max 1 drink of alcohol per day; avoid smoking/tobacco   Limit animal fats in diet for cholesterol and heart health - choose grass fed whenever available   Avoid highly processed foods, and foods high in saturated/trans fats   Aim for low stress - take time to unwind and care for your mental health   Aim for 150 min of moderate intensity exercise weekly for heart health, and weights twice weekly for bone health   Aim for 7-9 hours of sleep daily   When it comes to diets, agreement about the perfect plan isnt easy to find, even among the experts. Experts at the [employer] developed an idea known as the Healthy Eating Plate. Just imagine a plate divided into logical, healthy portions.   The emphasis is on diet quality:   Load up on vegetables and fruits - one-half of your plate: Aim for color and variety, and remember that potatoes dont count.   Go for whole grains - one-quarter of your plate: Whole wheat, barley, wheat berries, quinoa, oats, brown rice, and foods made with them. If you want pasta, go with whole wheat pasta.  Protein power - one-quarter of your plate: Fish, chicken, beans, and nuts are all healthy, versatile protein sources. Limit red meat.   The diet, however, does go beyond the plate, offering a few other suggestions.   Use healthy plant oils, such as  olive, canola, soy, corn, sunflower and peanut. Check the labels, and avoid partially hydrogenated oil, which have unhealthy trans fats.   If youre thirsty, drink water. Coffee and tea are good in moderation, but skip sugary drinks and limit milk and dairy products to one or two daily servings.   The type of carbohydrate in the diet is more important than the amount. Some sources of carbohydrates, such as vegetables, fruits, whole grains, and beans-are healthier than others.   Finally, stay active  Signed, Berniece Salines, DO  01/04/2020 9:06 AM    [place]

## 2020-06-28 DIAGNOSIS — E785 Hyperlipidemia, unspecified: Secondary | ICD-10-CM | POA: Insufficient documentation

## 2020-06-28 DIAGNOSIS — E039 Hypothyroidism, unspecified: Secondary | ICD-10-CM | POA: Insufficient documentation

## 2020-06-28 DIAGNOSIS — Z9861 Coronary angioplasty status: Secondary | ICD-10-CM | POA: Insufficient documentation

## 2020-06-28 DIAGNOSIS — I2510 Atherosclerotic heart disease of native coronary artery without angina pectoris: Secondary | ICD-10-CM | POA: Insufficient documentation

## 2020-07-02 ENCOUNTER — Encounter: Payer: Self-pay | Admitting: Cardiology

## 2020-07-02 ENCOUNTER — Other Ambulatory Visit: Payer: Self-pay

## 2020-07-02 ENCOUNTER — Ambulatory Visit (INDEPENDENT_AMBULATORY_CARE_PROVIDER_SITE_OTHER): Payer: Medicare Other | Admitting: Cardiology

## 2020-07-02 VITALS — BP 130/80 | HR 65 | Ht 68.0 in | Wt 190.2 lb

## 2020-07-02 DIAGNOSIS — I10 Essential (primary) hypertension: Secondary | ICD-10-CM

## 2020-07-02 DIAGNOSIS — E782 Mixed hyperlipidemia: Secondary | ICD-10-CM

## 2020-07-02 DIAGNOSIS — I2510 Atherosclerotic heart disease of native coronary artery without angina pectoris: Secondary | ICD-10-CM

## 2020-07-02 MED ORDER — METOPROLOL TARTRATE 50 MG PO TABS: 50.0000 mg | ORAL_TABLET | Freq: Two times a day (BID) | ORAL | 3 refills | Status: DC

## 2020-07-02 MED ORDER — CLOPIDOGREL BISULFATE 75 MG PO TABS: 75.0000 mg | ORAL_TABLET | Freq: Every day | ORAL | 3 refills | Status: DC

## 2020-07-02 MED ORDER — LISINOPRIL 30 MG PO TABS: 30.0000 mg | ORAL_TABLET | Freq: Two times a day (BID) | ORAL | 3 refills | Status: DC

## 2020-07-02 MED ORDER — FUROSEMIDE 20 MG PO TABS: ORAL_TABLET | ORAL | 3 refills | Status: DC

## 2020-07-02 MED ORDER — REPATHA SURECLICK 140 MG/ML ~~LOC~~ SOAJ
140.0000 mg | SUBCUTANEOUS | 3 refills | Status: DC
Start: 2020-07-02 — End: 2020-08-02

## 2020-07-02 MED ORDER — NITROGLYCERIN 0.4 MG SL SUBL: 0.4000 mg | SUBLINGUAL_TABLET | SUBLINGUAL | 3 refills | Status: DC | PRN

## 2020-07-02 MED ORDER — POTASSIUM CHLORIDE CRYS ER 20 MEQ PO TBCR: EXTENDED_RELEASE_TABLET | ORAL | 3 refills | Status: DC

## 2020-07-02 NOTE — Progress Notes (Signed)
Cardiology Office Note:    Date:  07/02/2020   ID:  [name] Dickerson, DOB 1945-12-26, MRN [id number]  PCP:  Bonnita Nasuti, MD  Cardiologist:  Glenetta Hew, MD  Electrophysiologist:  None   Referring MD: Suzan Garibaldi, FNP   I am doing well  History of Present Illness:    [name] Dickerson is a 75 y.o. male with a hx of Coronary artery disease going back to initial inferior wall STEMI back in 1995 and multiple interventions after that, hyperlipidemia on Repatha and Zetia, hypertension presents today for a follow-up visit.   I saw the patient on January 04, 2020 at that time he had planned surgery which he was cleared for.  He is here today for follow-up visit.  Past Medical History:  Diagnosis Date  . CAD S/P percutaneous coronary angioplasty 1995, 2003   Inferior STEMI-PTCA RCA; 2003: Overlapping MultiLink Zeta BMS -RCA (4.0 x 23, 4.0 x 15); 09/2001 Cutting Balloon PTCA of stents for ISR.;; Myoview August 2013 - Mod-severe sized fixed INFERIOR perfusion defect => likely infarct with mild peri-infarct ischemia in the apical inferior wall, EF 55% with distal inferior hypokinesis  . Essential hypertension   . Hyperlipidemia LDL goal <70   . Hypothyroidism   . Low testosterone   . Moderate cigarette smoker (10-19 per day)    Was down to 5 cigarettes a day.  . ST elevation myocardial infarction (STEMI) of inferior wall, subsequent episode of care [facility]) 1995   PTCA of RCA; large inferior scar noted on Myoview. Mid inferior and inferoseptal hypokinesis on echocardiogram November 2014. EF 50-55%.  . Stenosis of coronary stent 2012   Mid RCA Occlusion with Right-To-Left and Left-To-Right Collaterals.; Relatively normal LAD, ramus intermedius and circumflex-none.    Past Surgical History:  Procedure Laterality Date  . CARDIAC CATHETERIZATION  March 2012   Mid occlusion of RCA with Right to Left and Left to Right collaterals; LAD, ramus and circumflex/OM widely patent  . CORONARY ANGIOPLASTY   1995   Inferior STEMI-PTCA of RCA  . CORONARY STENT PLACEMENT  2003   MultiLink Zeta BMS 4.0 mm x 23 mm, 4.0 mm x 15 mm --> Cutting Balloon PTCA for ISR several months later.  Marland Kitchen LexiScan Myoview  August 2013   Moderate-severe sized INFERIOR perfusion defect with no evidence of ischemia. EF 55% with distal inferior HK. Large sized Inferior and Inferolateral Infarct with perhaps mild apical inferior ischemia.  . TRANSTHORACIC ECHOCARDIOGRAM  01/09/2013   EF 50-55%; normal LV size and function. Probable mild HJ of the mid inferior and inferoseptal wall.; No valvular lesions.    Current Medications: Current Meds  Medication Sig  . ALPRAZolam (XANAX) 0.5 MG tablet Take 0.5 mg by mouth at bedtime.  . Cinnamon 500 MG TABS Take 2,000 mg by mouth 2 (two) times daily.  . cyanocobalamin 1000 MCG tablet Take 1,000 mcg by mouth daily.  Marland Kitchen levothyroxine (SYNTHROID, LEVOTHROID) 200 MCG tablet Take 200 mcg by mouth daily before breakfast.  . meloxicam (MOBIC) 15 MG tablet Take 15 mg by mouth daily.  . metFORMIN (GLUCOPHAGE) 500 MG tablet Take by mouth.  . testosterone cypionate (DEPOTESTOSTERONE CYPIONATE) 200 MG/ML injection INJECT 1ML (200MG ) INTRAMUSCULARLY EVERY OTHER WEEK  . traMADol (ULTRAM) 50 MG tablet Take 50 mg by mouth as needed.   . [DISCONTINUED] clopidogrel (PLAVIX) 75 MG tablet Take 1 tablet (75 mg total) by mouth daily with breakfast.  . [DISCONTINUED] furosemide (LASIX) 20 MG tablet TAKE ONE TABLET BY MOUTH  TUESDAY, THURSDAY, AND SATURDAY  . [DISCONTINUED] lisinopril (PRINIVIL,ZESTRIL) 30 MG tablet Take 30 mg by mouth in the morning and at bedtime.   . [DISCONTINUED] metoprolol (LOPRESSOR) 50 MG tablet Take 50 mg by mouth 2 (two) times daily.  . [DISCONTINUED] nitroGLYCERIN (NITROSTAT) 0.4 MG SL tablet Place 1 tablet (0.4 mg total) under the tongue every 5 (five) minutes as needed for chest pain.  . [DISCONTINUED] potassium chloride SA (KLOR-CON M20) 20 MEQ tablet TAKE ONE TABLET ON  TUESDAY, THURSDAY AND SATURDAY     Allergies:   Statins   Social History   Socioeconomic History  . Marital status: Married    Spouse name: Not on file  . Number of children: Not on file  . Years of education: Not on file  . Highest education level: Not on file  Occupational History  . Not on file  Tobacco Use  . Smoking status: Current Every Day Smoker    Packs/day: 1.00    Years: 40.00    Pack years: 40.00    Types: Cigarettes  . Smokeless tobacco: Never Used  Substance and Sexual Activity  . Alcohol use: No  . Drug use: No  . Sexual activity: Not on file  Other Topics Concern  . Not on file  Social History Narrative   Married father of 22, grandfather 59. Works out occasionally on the treadmill. Coping to try get back into some exercise now. Unfortunately back up to smoking one pack per day. Does not drink alcohol.      Date from from 86 or 6 cigarettes a day. He's tried electronic cigarettes and down without affect.   Social Determinants of Health   Financial Resource Strain: Not on file  Food Insecurity: Not on file  Transportation Needs: Not on file  Physical Activity: Not on file  Stress: Not on file  Social Connections: Not on file     Family History: [name] family history is not on file.  ROS:   Review of Systems  Constitution: Negative for decreased appetite, fever and weight gain.  HENT: Negative for congestion, ear discharge, hoarse voice and sore throat.   Eyes: Negative for discharge, redness, vision loss in right eye and visual halos.  Cardiovascular: Negative for chest pain, dyspnea on exertion, leg swelling, orthopnea and palpitations.  Respiratory: Negative for cough, hemoptysis, shortness of breath and snoring.   Endocrine: Negative for heat intolerance and polyphagia.  Hematologic/Lymphatic: Negative for bleeding problem. Does not bruise/bleed easily.  Skin: Negative for flushing, nail changes, rash and suspicious lesions.   Musculoskeletal: Negative for arthritis, joint pain, muscle cramps, myalgias, neck pain and stiffness.  Gastrointestinal: Negative for abdominal pain, bowel incontinence, diarrhea and excessive appetite.  Genitourinary: Negative for decreased libido, genital sores and incomplete emptying.  Neurological: Negative for brief paralysis, focal weakness, headaches and loss of balance.  Psychiatric/Behavioral: Negative for altered mental status, depression and suicidal ideas.  Allergic/Immunologic: Negative for HIV exposure and persistent infections.    EKGs/Labs/Other Studies Reviewed:    The following studies were reviewed today:   EKG:  The ekg ordered today demonstrates   Pharmacologic nuclear stress test  The left ventricular ejection fraction is mildly decreased (45-54%).  Nuclear stress EF: 45%.  There was no ST segment deviation noted during stress.  Defect 1: There is a medium defect of moderate severity present in the basal inferior, basal inferolateral, mid inferior, mid inferolateral and apex location.  Findings consistent with prior myocardial infarction.  This is an intermediate risk  study.   Recent Labs: No results found for requested labs within last 8760 hours.  Recent Lipid Panel    Component Value Date/Time   CHOL 161 05/11/2018 0820   TRIG 167 (H) 05/11/2018 0820   HDL 34 (L) 05/11/2018 0820   CHOLHDL 4.7 05/11/2018 0820   CHOLHDL 5.3 05/15/2010 1048   VLDL 49 (H) 05/15/2010 1048   LDLCALC 94 05/11/2018 0820    Physical Exam:    VS:  BP 130/80   Pulse 65   Ht 5\' 8"  (1.727 m)   Wt 190 lb 3.2 oz (86.3 kg)   SpO2 97%   BMI 28.92 kg/m     Wt Readings from Last 3 Encounters:  07/02/20 190 lb 3.2 oz (86.3 kg)  01/04/20 193 lb 3.2 oz (87.6 kg)  10/10/19 194 lb (88 kg)     GEN: Well nourished, well developed in no acute distress HEENT: Normal NECK: No JVD; No carotid bruits LYMPHATICS: No lymphadenopathy CARDIAC: S1S2 noted,RRR, no murmurs, rubs,  gallops RESPIRATORY:  Clear to auscultation without rales, wheezing or rhonchi  ABDOMEN: Soft, non-tender, non-distended, +bowel sounds, no guarding. EXTREMITIES: No edema, No cyanosis, no clubbing MUSCULOSKELETAL:  No deformity  SKIN: Warm and dry NEUROLOGIC:  Alert and oriented x 3, non-focal PSYCHIATRIC:  Normal affect, good insight  ASSESSMENT:    1. Coronary artery disease involving native coronary artery of native heart, unspecified whether angina present   2. Hypertension, unspecified type   3. Mixed hyperlipidemia    PLAN:     He appears to be doing well from a cardiovascular standpoint.  No medication changes will be made at this time.  We will send refills of his medications.  He is going to restart on his Repatha.  He stopped this because he ran out and had not notify my office for refills.  He recently had blood work with his PCP which were going to request these records.  As I will need a information on his updated recent lipid profile.  The last information that we have here was from January 2021.  But the patient tells me that he had blood work few months ago.   The patient is in agreement with the above plan. The patient left the office in stable condition.  The patient will follow up in   Medication Adjustments/Labs and Tests Ordered: Current medicines are reviewed at length with the patient today.  Concerns regarding medicines are outlined above.  No orders of the defined types were placed in this encounter.  Meds ordered this encounter  Medications  . clopidogrel (PLAVIX) 75 MG tablet    Sig: Take 1 tablet (75 mg total) by mouth daily with breakfast.    Dispense:  90 tablet    Refill:  3  . furosemide (LASIX) 20 MG tablet    Sig: TAKE ONE TABLET BY MOUTH TUESDAY, THURSDAY, AND SATURDAY    Dispense:  36 tablet    Refill:  3  . lisinopril (ZESTRIL) 30 MG tablet    Sig: Take 1 tablet (30 mg total) by mouth in the morning and at bedtime.    Dispense:  180  tablet    Refill:  3  . metoprolol tartrate (LOPRESSOR) 50 MG tablet    Sig: Take 1 tablet (50 mg total) by mouth 2 (two) times daily.    Dispense:  180 tablet    Refill:  3  . nitroGLYCERIN (NITROSTAT) 0.4 MG SL tablet    Sig: Place 1 tablet (0.4  mg total) under the tongue every 5 (five) minutes as needed for chest pain.    Dispense:  25 tablet    Refill:  3  . potassium chloride SA (KLOR-CON M20) 20 MEQ tablet    Sig: TAKE ONE TABLET ON TUESDAY, THURSDAY AND SATURDAY    Dispense:  36 tablet    Refill:  3  . Evolocumab (REPATHA SURECLICK) 950 MG/ML SOAJ    Sig: Inject 140 mg into the skin every 14 (fourteen) days.    Dispense:  6 mL    Refill:  3    Patient Instructions  Medication Instructions:  Your physician has recommended you make the following change in your medication: Restart:  Repatha  Refilled: Nitroglycerin, Plavix, Lasix, Potassium, Lisinopril, Lopressor  *If you need a refill on your cardiac medications before your next appointment, please call your pharmacy*   Lab Work: None If you have labs (blood work) drawn today and your tests are completely normal, you will receive your results only by: Marland Kitchen MyChart Message (if you have MyChart) OR . A paper copy in the mail If you have any lab test that is abnormal or we need to change your treatment, we will call you to review the results.   Testing/Procedures: None   Follow-Up: At [facility], you and your health needs are our priority.  As part of our continuing mission to provide you with exceptional heart care, we have created designated Provider Care Teams.  These Care Teams include your primary Cardiologist (physician) and Advanced Practice Providers (APPs -  Physician Assistants and Nurse Practitioners) who all work together to provide you with the care you need, when you need it.  We recommend signing up for the patient portal called "MyChart".  Sign up information is provided on this After Visit Summary.   MyChart is used to connect with patients for Virtual Visits (Telemedicine).  Patients are able to view lab/test results, encounter notes, upcoming appointments, etc.  Non-urgent messages can be sent to your provider as well.   To learn more about what you can do with MyChart, go to [email or web address].    Your next appointment:   1 year(s)  The format for your next appointment:   In Person  Provider:   Berniece Salines, DO   Other Instructions      Adopting a Healthy Lifestyle.  Know what a healthy weight is for you (roughly BMI <25) and aim to maintain this   Aim for 7+ servings of fruits and vegetables daily   65-80+ fluid ounces of water or unsweet tea for healthy kidneys   Limit to max 1 drink of alcohol per day; avoid smoking/tobacco   Limit animal fats in diet for cholesterol and heart health - choose grass fed whenever available   Avoid highly processed foods, and foods high in saturated/trans fats   Aim for low stress - take time to unwind and care for your mental health   Aim for 150 min of moderate intensity exercise weekly for heart health, and weights twice weekly for bone health   Aim for 7-9 hours of sleep daily   When it comes to diets, agreement about the perfect plan isnt easy to find, even among the experts. Experts at the [employer] developed an idea known as the Healthy Eating Plate. Just imagine a plate divided into logical, healthy portions.   The emphasis is on diet quality:   Load up on vegetables and fruits - one-half of your  plate: Aim for color and variety, and remember that potatoes dont count.   Go for whole grains - one-quarter of your plate: Whole wheat, barley, wheat berries, quinoa, oats, brown rice, and foods made with them. If you want pasta, go with whole wheat pasta.   Protein power - one-quarter of your plate: Fish, chicken, beans, and nuts are all healthy, versatile protein sources. Limit red meat.   The  diet, however, does go beyond the plate, offering a few other suggestions.   Use healthy plant oils, such as olive, canola, soy, corn, sunflower and peanut. Check the labels, and avoid partially hydrogenated oil, which have unhealthy trans fats.   If youre thirsty, drink water. Coffee and tea are good in moderation, but skip sugary drinks and limit milk and dairy products to one or two daily servings.   The type of carbohydrate in the diet is more important than the amount. Some sources of carbohydrates, such as vegetables, fruits, whole grains, and beans-are healthier than others.   Finally, stay active  Signed, Berniece Salines, DO  07/02/2020 12:09 PM    [place]

## 2020-07-02 NOTE — Patient Instructions (Signed)
Medication Instructions:  Your physician has recommended you make the following change in your medication: Restart:  Repatha  Refilled: Nitroglycerin, Plavix, Lasix, Potassium, Lisinopril, Lopressor  *If you need a refill on your cardiac medications before your next appointment, please call your pharmacy*   Lab Work: None If you have labs (blood work) drawn today and your tests are completely normal, you will receive your results only by: Marland Kitchen MyChart Message (if you have MyChart) OR . A paper copy in the mail If you have any lab test that is abnormal or we need to change your treatment, we will call you to review the results.   Testing/Procedures: None   Follow-Up: At [facility], you and your health needs are our priority.  As part of our continuing mission to provide you with exceptional heart care, we have created designated Provider Care Teams.  These Care Teams include your primary Cardiologist (physician) and Advanced Practice Providers (APPs -  Physician Assistants and Nurse Practitioners) who all work together to provide you with the care you need, when you need it.  We recommend signing up for the patient portal called "MyChart".  Sign up information is provided on this After Visit Summary.  MyChart is used to connect with patients for Virtual Visits (Telemedicine).  Patients are able to view lab/test results, encounter notes, upcoming appointments, etc.  Non-urgent messages can be sent to your provider as well.   To learn more about what you can do with MyChart, go to [email or web address].    Your next appointment:   1 year(s)  The format for your next appointment:   In Person  Provider:   Berniece Salines, DO   Other Instructions

## 2020-07-03 ENCOUNTER — Telehealth: Payer: Self-pay

## 2020-07-03 NOTE — Telephone Encounter (Signed)
Spoke with patient's wife about patient restarting Repatha due to his LDL still being elevated. She verbalizes understanding. No further questions or concerns at this time.

## 2020-07-04 ENCOUNTER — Other Ambulatory Visit: Payer: Self-pay | Admitting: Cardiology

## 2021-06-22 ENCOUNTER — Other Ambulatory Visit: Payer: Self-pay | Admitting: Cardiology

## 2021-07-04 ENCOUNTER — Ambulatory Visit (INDEPENDENT_AMBULATORY_CARE_PROVIDER_SITE_OTHER): Payer: Medicare Other | Admitting: Cardiology

## 2021-07-04 ENCOUNTER — Encounter: Payer: Self-pay | Admitting: Cardiology

## 2021-07-04 VITALS — BP 112/70 | HR 83 | Ht 68.0 in | Wt 193.0 lb

## 2021-07-04 DIAGNOSIS — I10 Essential (primary) hypertension: Secondary | ICD-10-CM

## 2021-07-04 DIAGNOSIS — F17210 Nicotine dependence, cigarettes, uncomplicated: Secondary | ICD-10-CM

## 2021-07-04 DIAGNOSIS — I2510 Atherosclerotic heart disease of native coronary artery without angina pectoris: Secondary | ICD-10-CM

## 2021-07-04 DIAGNOSIS — M25559 Pain in unspecified hip: Secondary | ICD-10-CM | POA: Insufficient documentation

## 2021-07-04 DIAGNOSIS — E119 Type 2 diabetes mellitus without complications: Secondary | ICD-10-CM | POA: Diagnosis not present

## 2021-07-04 HISTORY — DX: Pain in unspecified hip: M25.559

## 2021-07-04 NOTE — Addendum Note (Signed)
Addended by: Darrel Reach on: 07/04/2021 09:27 AM ? ? Modules accepted: Orders ? ?

## 2021-07-04 NOTE — Patient Instructions (Signed)
Medication Instructions:  ?Your physician recommends that you continue on your current medications as directed. Please refer to the Current Medication list given to you today. ? ?*If you need a refill on your cardiac medications before your next appointment, please call your pharmacy* ? ? ?Lab Work: ?Your physician recommends that you return for lab work in:  Direct LDL, Hemoglobin A1C and BMP ?If you have labs (blood work) drawn today and your tests are completely normal, you will receive your results only by: ?MyChart Message (if you have MyChart) OR ?A paper copy in the mail ?If you have any lab test that is abnormal or we need to change your treatment, we will call you to review the results. ? ? ?Testing/Procedures: ?Your physician has requested that you have an echocardiogram. Echocardiography is a painless test that uses sound waves to create images of your heart. It provides your doctor with information about the size and shape of your heart and how well your heart?s chambers and valves are working. This procedure takes approximately one hour. There are no restrictions for this procedure. ? ? ? ? ?Follow-Up: ?At [facility], you and your health needs are our priority.  As part of our continuing mission to provide you with exceptional heart care, we have created designated Provider Care Teams.  These Care Teams include your primary Cardiologist (physician) and Advanced Practice Providers (APPs -  Physician Assistants and Nurse Practitioners) who all work together to provide you with the care you need, when you need it. ? ?We recommend signing up for the patient portal called "MyChart".  Sign up information is provided on this After Visit Summary.  MyChart is used to connect with patients for Virtual Visits (Telemedicine).  Patients are able to view lab/test results, encounter notes, upcoming appointments, etc.  Non-urgent messages can be sent to your provider as well.   ?To learn more about what you can do  with MyChart, go to [email or web address].   ? ?Your next appointment:   ?6 month(s) ? ?The format for your next appointment:   ?In Person ? ?Provider:   ?Jenne Campus, MD  ? ? ?Other Instructions ? ? ?Important Information About Sugar ? ? ? ? ?  ?

## 2021-07-04 NOTE — Progress Notes (Signed)
?Cardiology Office Note:   ? ?Date:  07/04/2021  ? ?ID:  DELONTA Dickerson, DOB 17-Dec-1945, MRN [id number] ? ?PCP:  Bonnita Nasuti, MD  ?Cardiologist:  Jenne Campus, MD   ? ?Referring MD: Bonnita Nasuti, MD  ? ?Chief Complaint  ?Patient presents with  ? Follow-up  ?I am doing fine ? ?History of Present Illness:   ? ?[name] Dickerson is a 76 y.o. male with past medical history significant for coronary artery disease in 1995 he got inferior wall STEMI since that time multiple interventions, he does have dyslipidemia he is on Repatha and Zetia, essential hypertension, smoking which is still ongoing.  He comes today to my office for follow-up.  Overall doing very well.  Denies of any chest pain tightness squeezing pressure burning chest no dizziness no passing out. ? ?Past Medical History:  ?Diagnosis Date  ? CAD S/P percutaneous coronary angioplasty 1995, 2003  ? Inferior STEMI-PTCA RCA; 2003: Overlapping MultiLink Zeta BMS -RCA (4.0 x 23, 4.0 x 15); 09/2001 Cutting Balloon PTCA of stents for ISR.;; Myoview August 2013 - Mod-severe sized fixed INFERIOR perfusion defect => likely infarct with mild peri-infarct ischemia in the apical inferior wall, EF 55% with distal inferior hypokinesis  ? Essential hypertension   ? Hyperlipidemia LDL goal <70   ? Hypothyroidism   ? Low testosterone   ? Moderate cigarette smoker (10-19 per day)   ? Was down to 5 cigarettes a day.  ? ST elevation myocardial infarction (STEMI) of inferior wall, subsequent episode of care [facility]) 1995  ? PTCA of RCA; large inferior scar noted on Myoview. Mid inferior and inferoseptal hypokinesis on echocardiogram November 2014. EF 50-55%.  ? Stenosis of coronary stent 2012  ? Mid RCA Occlusion with Right-To-Left and Left-To-Right Collaterals.; Relatively normal LAD, ramus intermedius and circumflex-none.  ? ? ?Past Surgical History:  ?Procedure Laterality Date  ? CARDIAC CATHETERIZATION  March 2012  ? Mid occlusion of RCA with Right to Left and Left to Right  collaterals; LAD, ramus and circumflex/OM widely patent  ? CORONARY ANGIOPLASTY  1995  ? Inferior STEMI-PTCA of RCA  ? CORONARY STENT PLACEMENT  2003  ? MultiLink Zeta BMS 4.0 mm x 23 mm, 4.0 mm x 15 mm --> Cutting Balloon PTCA for ISR several months later.  ? LexiScan Myoview  August 2013  ? Moderate-severe sized INFERIOR perfusion defect with no evidence of ischemia. EF 55% with distal inferior HK. Large sized Inferior and Inferolateral Infarct with perhaps mild apical inferior ischemia.  ? TRANSTHORACIC ECHOCARDIOGRAM  01/09/2013  ? EF 50-55%; normal LV size and function. Probable mild HJ of the mid inferior and inferoseptal wall.; No valvular lesions.  ? ? ?Current Medications: ?Current Meds  ?Medication Sig  ? ALPRAZolam (XANAX) 0.5 MG tablet Take 0.5 mg by mouth at bedtime.  ? Cinnamon 500 MG TABS Take 2,000 mg by mouth 2 (two) times daily.  ? clopidogrel (PLAVIX) 75 MG tablet Take 1 tablet (75 mg total) by mouth daily with breakfast.  ? cyanocobalamin 1000 MCG tablet Take 1,000 mcg by mouth daily.  ? Dulaglutide (TRULICITY) 0.10 XN/2.3FT SOPN Inject 0.75 mg into the skin once a week.  ? furosemide (LASIX) 20 MG tablet TAKE ONE TABLET BY MOUTH TUESDAY, THURSDAY, AND SATURDAY (Patient taking differently: Take 20 mg by mouth See admin instructions. TAKE ONE TABLET BY MOUTH TUESDAY, THURSDAY, AND SATURDAY)  ? levothyroxine (SYNTHROID, LEVOTHROID) 200 MCG tablet Take 200 mcg by mouth daily before breakfast.  ? lisinopril (ZESTRIL)  30 MG tablet Take 1 tablet (30 mg total) by mouth in the morning and at bedtime.  ? meloxicam (MOBIC) 15 MG tablet Take 15 mg by mouth daily.  ? metoprolol tartrate (LOPRESSOR) 50 MG tablet Take 1 tablet (50 mg total) by mouth 2 (two) times daily.  ? nitroGLYCERIN (NITROSTAT) 0.4 MG SL tablet Place 1 tablet (0.4 mg total) under the tongue every 5 (five) minutes as needed for chest pain.  ? potassium chloride SA (KLOR-CON M20) 20 MEQ tablet TAKE ONE TABLET ON TUESDAY, THURSDAY AND  SATURDAY (Patient taking differently: Take 20 mEq by mouth daily. TAKE ONE TABLET ON TUESDAY, THURSDAY AND SATURDAY)  ? REPATHA SURECLICK 778 MG/ML SOAJ INJECT 140 MG INTO THE SKIN EVERY 14 (FOURTEEN) DAYS.  ? testosterone cypionate (DEPOTESTOSTERONE CYPIONATE) 200 MG/ML injection Inject 200 mg into the muscle every 14 (fourteen) days.  ? traMADol (ULTRAM) 50 MG tablet Take 50 mg by mouth as needed for moderate pain or severe pain.  ? [DISCONTINUED] metFORMIN (GLUCOPHAGE) 500 MG tablet Take by mouth.  ?  ? ?Allergies:   Statins  ? ?Social History  ? ?Socioeconomic History  ? Marital status: Married  ?  Spouse name: Not on file  ? Number of children: Not on file  ? Years of education: Not on file  ? Highest education level: Not on file  ?Occupational History  ? Not on file  ?Tobacco Use  ? Smoking status: Every Day  ?  Packs/day: 1.00  ?  Years: 40.00  ?  Pack years: 40.00  ?  Types: Cigarettes  ? Smokeless tobacco: Never  ?Substance and Sexual Activity  ? Alcohol use: No  ? Drug use: No  ? Sexual activity: Not on file  ?Other Topics Concern  ? Not on file  ?Social History Narrative  ? Married father of 66, grandfather 69. Works out occasionally on the treadmill. Coping to try get back into some exercise now. Unfortunately back up to smoking one pack per day. Does not drink alcohol.  ?   ? Date from from 5 or 6 cigarettes a day. He's tried electronic cigarettes and down without affect.  ? ?Social Determinants of Health  ? ?Financial Resource Strain: Not on file  ?Food Insecurity: Not on file  ?Transportation Needs: Not on file  ?Physical Activity: Not on file  ?Stress: Not on file  ?Social Connections: Not on file  ?  ? ?Family History: ?[name] family history is not on file. ?ROS:   ?Please see the history of present illness.    ?All 14 point review of systems negative except as described per history of present illness ? ?EKGs/Labs/Other Studies Reviewed:   ? ? ? ?Recent Labs: ?No results found for requested  labs within last 8760 hours.  ?Recent Lipid Panel ?   ?Component Value Date/Time  ? CHOL 161 05/11/2018 0820  ? TRIG 167 (H) 05/11/2018 0820  ? HDL 34 (L) 05/11/2018 0820  ? CHOLHDL 4.7 05/11/2018 0820  ? CHOLHDL 5.3 05/15/2010 1048  ? VLDL 49 (H) 05/15/2010 1048  ? [place] 94 05/11/2018 0820  ? ? ?Physical Exam:   ? ?VS:  BP 112/70 (BP Location: Right Arm, Patient Position: Sitting)   Pulse 83   Ht '5\' 8"'$  (1.727 m)   Wt 193 lb (87.5 kg)   SpO2 96%   BMI 29.35 kg/m?    ? ?Wt Readings from Last 3 Encounters:  ?07/04/21 193 lb (87.5 kg)  ?07/02/20 190 lb 3.2 oz (86.3 kg)  ?  01/04/20 193 lb 3.2 oz (87.6 kg)  ?  ? ?GEN:  Well nourished, well developed in no acute distress ?HEENT: Normal ?NECK: No JVD; No carotid bruits ?LYMPHATICS: No lymphadenopathy ?CARDIAC: RRR, no murmurs, no rubs, no gallops ?RESPIRATORY:  Clear to auscultation without rales, wheezing or rhonchi  ?ABDOMEN: Soft, non-tender, non-distended ?MUSCULOSKELETAL:  No edema; No deformity  ?SKIN: Warm and dry ?LOWER EXTREMITIES: no swelling ?NEUROLOGIC:  Alert and oriented x 3 ?PSYCHIATRIC:  Normal affect  ? ?ASSESSMENT:   ? ?1. Coronary artery disease, occlusive   ?2. Essential hypertension   ?3. Type 2 diabetes mellitus without complication, without long-term current use of insulin ([place])   ?4. Moderate cigarette smoker (10-19 per day)   ? ?PLAN:   ? ?In order of problems listed above: ? ?Coronary artery disease status post myocardial infarction 1995 apparently diminished ejection fraction.  I will ask him to have repeated echocardiogram to recheck left ventricle ejection fraction, I do not see Q waves on the EKG.  He is already on antiplatelet therapy in form of Plavix, asymptomatic, he is on beta-blocker which I will continue. ?Apparently cardiomyopathy diminished ejection fraction after my echocardiogram will be done to recheck on that. ?Type 2 diabetes followed by internal medicine team however the last number I have is from 2021 we will check his  hemoglobin A1c today. ?Smoking which is still ongoing.  Obviously huge problem I spent great of time talking to him about quitting he understand that he needs to however he is on restricted probably will not be able to we wi

## 2021-07-05 LAB — BASIC METABOLIC PANEL
BUN/Creatinine Ratio: 10 (ref 10–24)
BUN: 12 mg/dL (ref 8–27)
CO2: 25 mmol/L (ref 20–29)
Calcium: 9.6 mg/dL (ref 8.6–10.2)
Chloride: 97 mmol/L (ref 96–106)
Creatinine, Ser: 1.24 mg/dL (ref 0.76–1.27)
Glucose: 106 mg/dL — ABNORMAL HIGH (ref 70–99)
Potassium: 4.5 mmol/L (ref 3.5–5.2)
Sodium: 136 mmol/L (ref 134–144)
eGFR: 61 mL/min/{1.73_m2} (ref >59)

## 2021-07-05 LAB — LDL CHOLESTEROL, DIRECT: LDL Direct: 121 mg/dL — ABNORMAL HIGH (ref 0–99)

## 2021-07-05 LAB — HEMOGLOBIN A1C
Est. average glucose Bld gHb Est-mCnc: 140 mg/dL
Hgb A1c MFr Bld: 6.5 % — ABNORMAL HIGH (ref 4.8–5.6)

## 2021-07-10 ENCOUNTER — Telehealth: Payer: Self-pay

## 2021-07-10 ENCOUNTER — Ambulatory Visit (INDEPENDENT_AMBULATORY_CARE_PROVIDER_SITE_OTHER): Payer: Medicare Other

## 2021-07-10 DIAGNOSIS — I10 Essential (primary) hypertension: Secondary | ICD-10-CM | POA: Diagnosis not present

## 2021-07-10 DIAGNOSIS — I2510 Atherosclerotic heart disease of native coronary artery without angina pectoris: Secondary | ICD-10-CM | POA: Diagnosis not present

## 2021-07-10 DIAGNOSIS — F17210 Nicotine dependence, cigarettes, uncomplicated: Secondary | ICD-10-CM | POA: Diagnosis not present

## 2021-07-10 DIAGNOSIS — E119 Type 2 diabetes mellitus without complications: Secondary | ICD-10-CM

## 2021-07-10 DIAGNOSIS — E782 Mixed hyperlipidemia: Secondary | ICD-10-CM

## 2021-07-10 LAB — ECHOCARDIOGRAM COMPLETE
Area-P 1/2: 5.07 cm2
S' Lateral: 3.5 cm

## 2021-07-10 MED ORDER — EZETIMIBE 10 MG PO TABS: 10.0000 mg | ORAL_TABLET | Freq: Every day | ORAL | 2 refills | Status: DC

## 2021-07-10 NOTE — Telephone Encounter (Signed)
Spoke with Silva Bandy on [employer], notified of results and recommendation. She said the patient has tried this before but sure why this was d/c. Patient was sleeping at the time. I asked when the patient wake up, to please call and report what took place when he was on Zetia. She verbalized understanding.  ?

## 2021-07-10 NOTE — Telephone Encounter (Signed)
-----   Message from Park Liter, MD sent at 07/08/2021  8:57 PM EDT ----- ?Cholesterol still not exactly where it supposed to be.  Please add Zetia 10 mg daily to his medical regimen ?

## 2021-07-10 NOTE — Telephone Encounter (Signed)
Confirmed with patient he does not recall having any issues with Zetia but is statin intolerant. Patient will try Zetia and is aware to come by the office sometime on 08/21/21 fasting for blood work. Per Dr. Agustin Cree  verbally. Orders on file, rx sent.  ?

## 2021-07-16 ENCOUNTER — Other Ambulatory Visit: Payer: Self-pay | Admitting: Cardiology

## 2021-07-23 ENCOUNTER — Encounter: Payer: Self-pay | Admitting: Gastroenterology

## 2021-08-14 ENCOUNTER — Other Ambulatory Visit: Payer: Self-pay | Admitting: Cardiology

## 2021-08-20 ENCOUNTER — Other Ambulatory Visit: Payer: Self-pay | Admitting: Cardiology

## 2021-08-21 IMAGING — XA DG MYELOGRAPHY LUMBAR INJ LUMBOSACRAL
13 of 16 series · 13 of 16 positions shown · non-contrast
Comparison: Lumbar spine MRI 05/10/2019

CLINICAL DATA: Low back pain radiating primarily down the posterior
right leg.
TECHNIQUE: Contiguous axial images were obtained through the Lumbar spine after
the intrathecal infusion of infusion. Coronal and sagittal
reconstructions were obtained of the axial image sets.

[Series 1: vasc standard · 1 of 1 slices shown (1 of 11)]
[im 1/1]
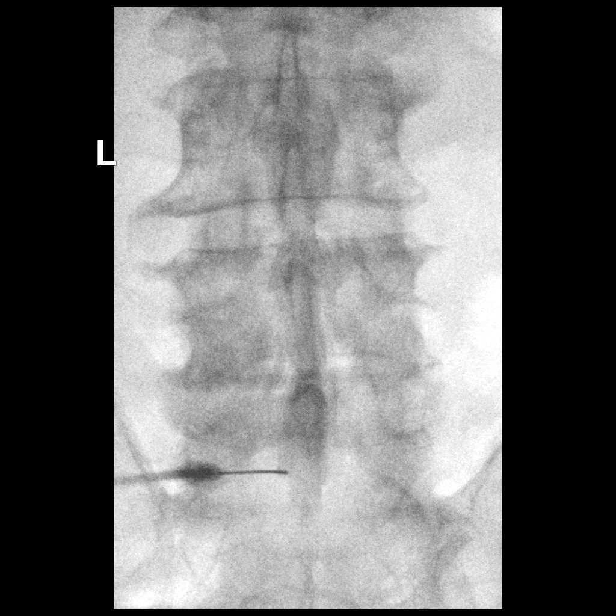

[Series 1: w lumbar spine lat · 0.15mm/px · 1 of 1 slices shown]
[im 1/1]
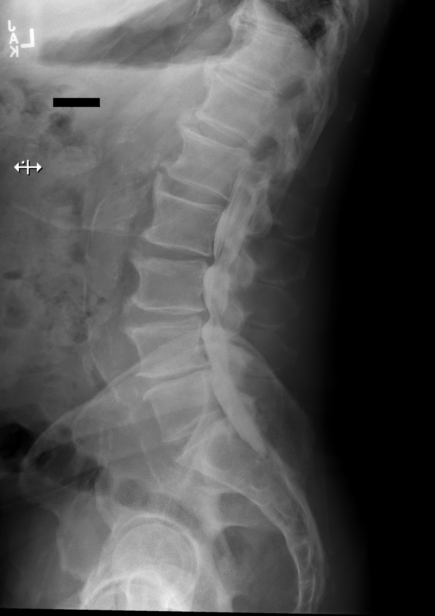

[Series 2: vasc standard · 1 of 1 slices shown (2 of 11)]
[im 1/1]
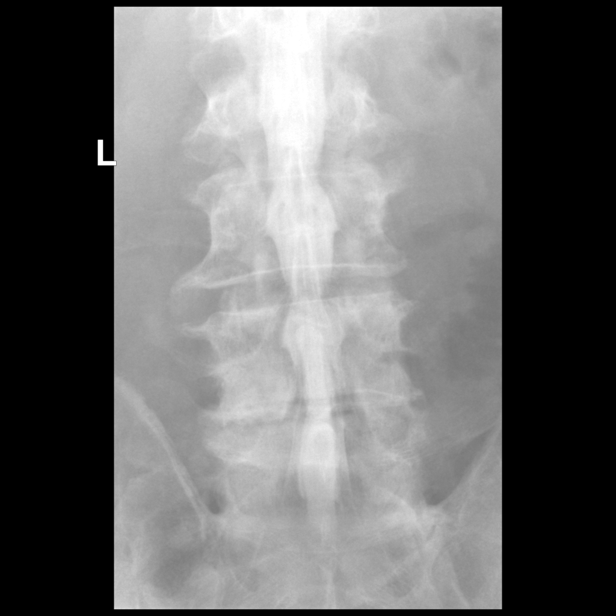

[Series 3: w lumbar spine extension · 0.15mm/px · 1 of 1 slices shown]
[im 1/1]
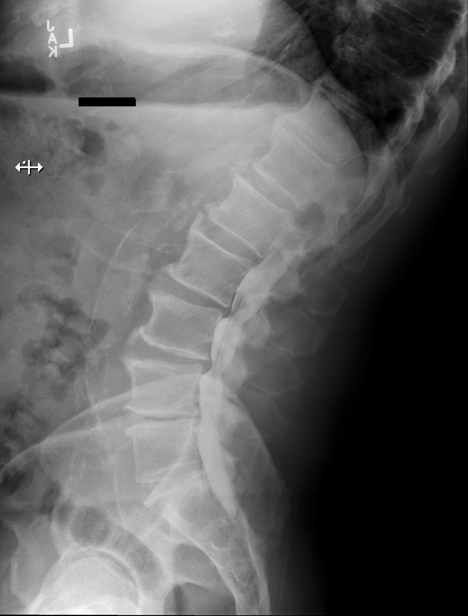

[Series 3: vasc standard · 1 of 1 slices shown (3 of 11)]
[im 1/1]
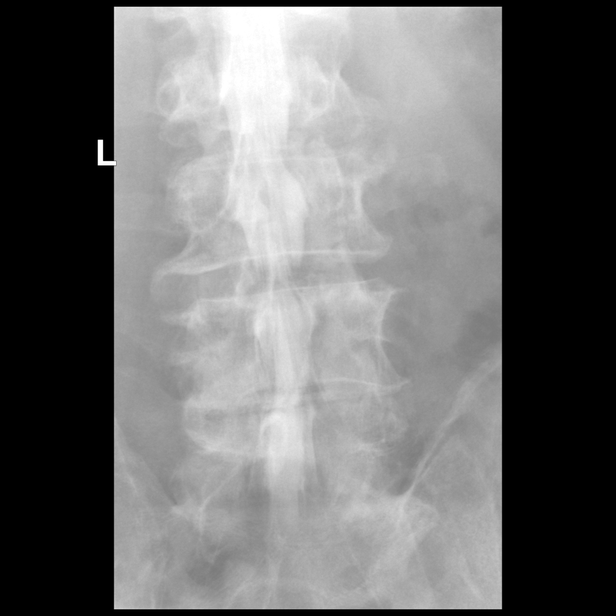

[Series 4: vasc standard · 1 of 1 slices shown (4 of 11)]
[im 1/1]
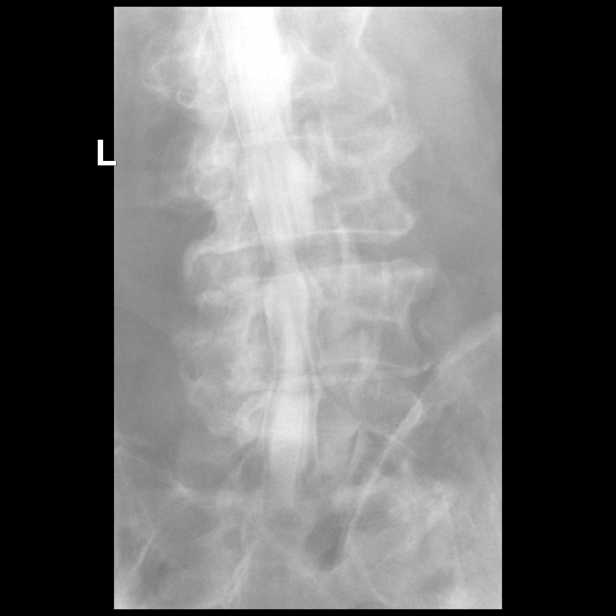

[Series 6: vasc standard · 1 of 1 slices shown (5 of 11)]
[im 1/1]
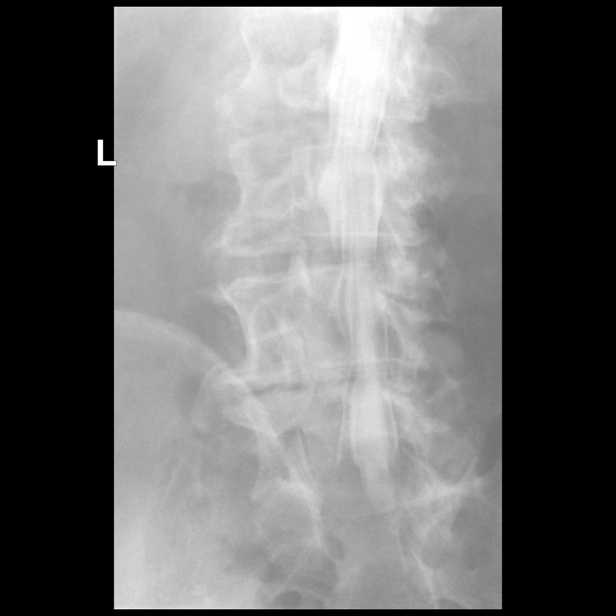

[Series 7: vasc standard · 1 of 1 slices shown (6 of 11)]
[im 1/1]
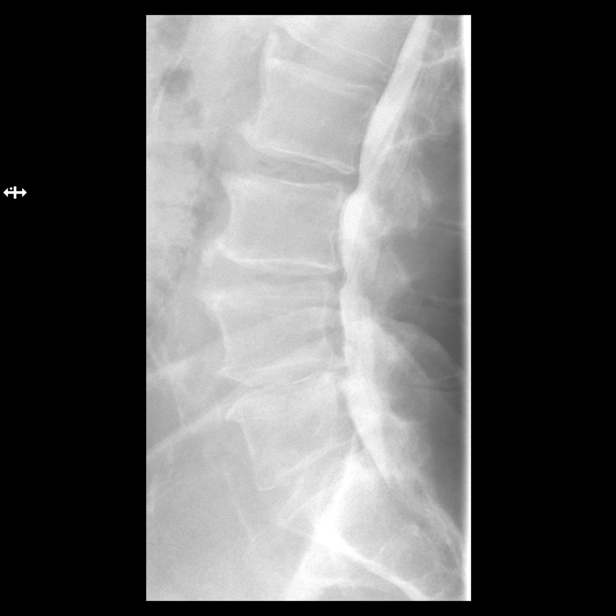

[Series 8: vasc standard · 1 of 1 slices shown (7 of 11)]
[im 1/1]
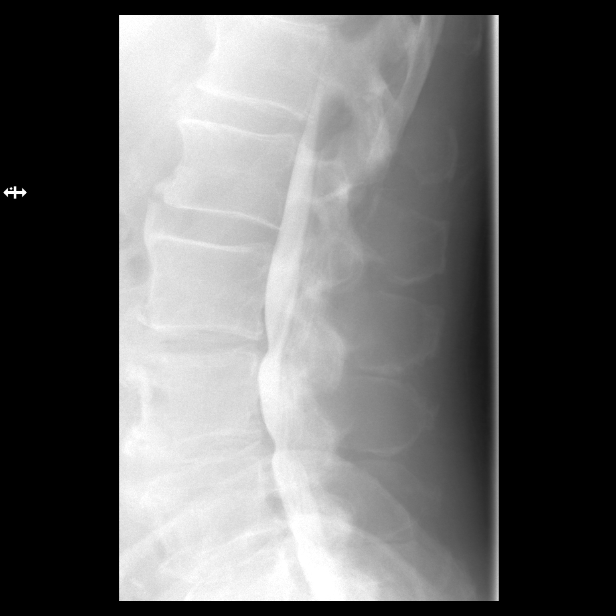

[Series 9: vasc standard · 1 of 1 slices shown (8 of 11)]
[im 1/1]
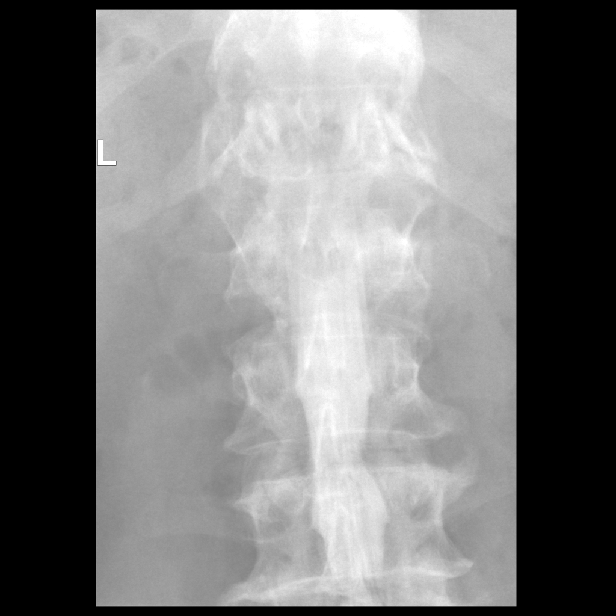

[Series 10: vasc standard · 1 of 1 slices shown (9 of 11)]
[im 1/1]
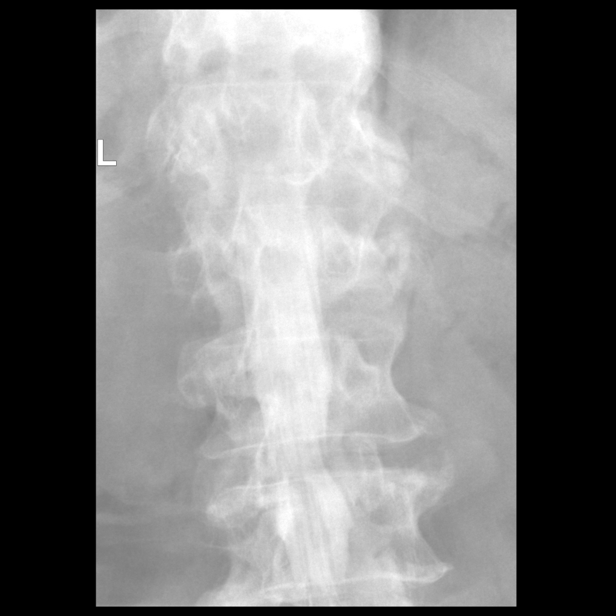

[Series 12: vasc standard · 1 of 1 slices shown (10 of 11)]
[im 1/1]
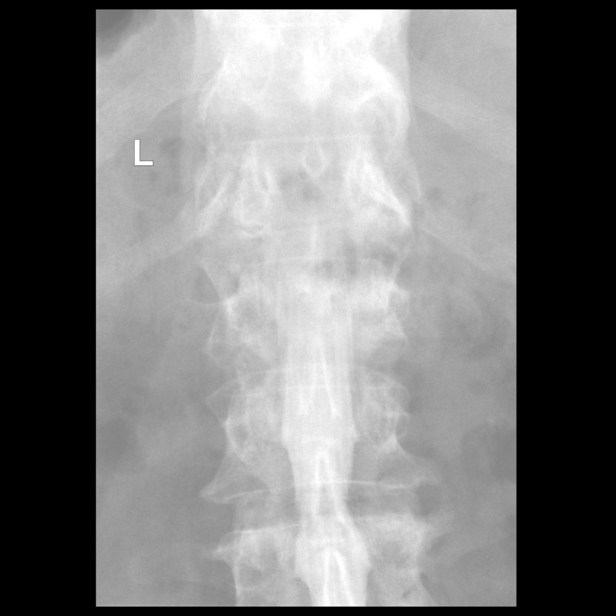

[Series 13: vasc standard · 1 of 1 slices shown (11 of 11)]
[im 1/1]
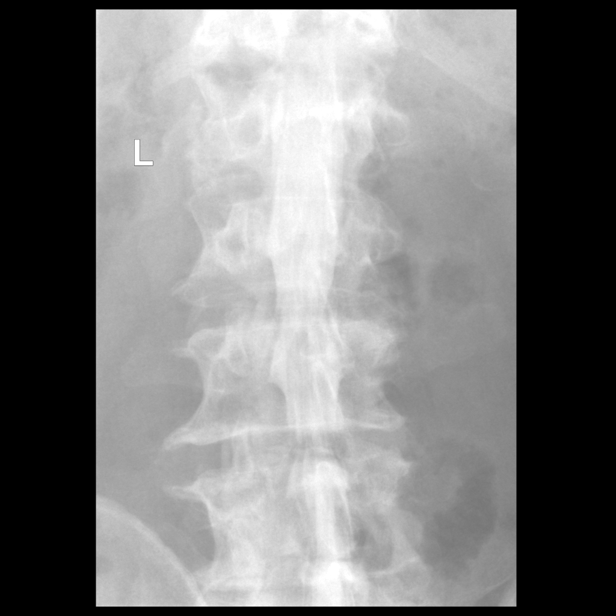

[13 of 16 positions shown; findings below may reference images not displayed]

EXAM:
LUMBAR MYELOGRAM

FLUOROSCOPY TIME:  Fluoroscopy Time: 39 seconds

Radiation Exposure Index: 226.72 microGray*m^2

PROCEDURE:
After thorough discussion of risks and benefits of the procedure
including bleeding, infection, injury to nerves, blood vessels,
adjacent structures as well as headache and CSF leak, written and
oral informed consent was obtained. Consent was obtained by Dr.
Quirijn Amazigh. Time out form was completed.

Patient was positioned prone on the fluoroscopy table. Local
anesthesia was provided with 1% lidocaine without epinephrine after
prepped and draped in the usual sterile fashion. Puncture was
performed at L5-S1 using a 3 1/2 inch 22-gauge spinal needle via a
left interlaminar approach. Using a single pass through the dura,
the needle was placed within the thecal sac, with return of clear
CSF. 15 mL of Isovue M-GXX was injected into the thecal sac, with
normal opacification of the nerve roots and cauda equina consistent
with free flow within the subarachnoid space.

I personally performed the lumbar puncture and administered the
intrathecal contrast. I also personally supervised acquisition of
the myelogram images.
FINDINGS: LUMBAR MYELOGRAM FINDINGS:

There are 5 non rib-bearing lumbar type vertebrae. There is trace
retrolisthesis of L3 on L4 which does not significantly change with
flexion or extension. There is moderate waist like narrowing of the
thecal sac at L3-4 with evidence of bilateral lateral recess
stenosis. Spinal stenosis at this level mildly worsens with standing
and improves with flexion. There is evidence of mild spinal stenosis
at L2-3.

CT LUMBAR MYELOGRAM FINDINGS:

Vertebral alignment is unchanged from the prior MRI and is near
anatomic. No fracture or destructive osseous process is identified.
Degenerative endplate changes are again seen from L2-3 to L4-5 with
associated Schmorl's nodes and vacuum disc. Disc space narrowing is
moderate at L4-5 and mild at L2-3 and L3-4. The conus medullaris
terminates at T12-L1.

There is asymmetric ankylosis across the left greater than right SI
joints. The reservoir of a penile prosthesis is partially visualized
to the left of the urinary bladder. Aortoiliac atherosclerosis is
noted. An approximately 6 cm right renal cyst is partially
visualized.

T12-L1 and L1-2: Spondylosis with anterior vertebral osteophytes at
both levels. Mild facet hypertrophy at L1-2. No disc herniation or
stenosis.

L2-3: Circumferential disc bulging and moderate facet and ligamentum
flavum hypertrophy result in mild spinal stenosis, mild-to-moderate
left greater than right lateral recess stenosis, and
mild-to-moderate bilateral neural foraminal stenosis, unchanged.

L3-4: Circumferential disc bulging and moderate facet and ligamentum
flavum hypertrophy result in mild-to-moderate spinal stenosis,
moderate bilateral lateral recess stenosis, and moderate bilateral
neural foraminal stenosis. Spinal and lateral recess stenosis are
slightly more prominent in appearance on the prior MRI, and as noted
above spinal stenosis mildly worsens with standing. The L4 nerve
roots may be affected in the lateral recesses.

L4-5: Circumferential disc bulging, a left foraminal disc osteophyte
complex, and moderate facet and ligamentum flavum hypertrophy result
in moderate left greater than right lateral recess stenosis and
moderate left neural foraminal stenosis with potential left L4 and
L5 nerve root impingement, unchanged.

L5-S1: Minimal disc bulging, endplate spurring, and moderate right
and mild left facet hypertrophy without significant stenosis,
unchanged.
IMPRESSION: 1. Moderate multifactorial spinal stenosis at L3-4 which mildly
worsens with standing.
2. Mild spinal stenosis at L2-3.
3. Moderate neural foraminal stenosis on the left at L4-5 and
bilaterally at L3-4.
4.  Aortic Atherosclerosis ([id number]).

## 2021-08-22 LAB — LIPID PANEL
Chol/HDL Ratio: 4.9 ratio (ref 0.0–5.0)
Cholesterol, Total: 156 mg/dL (ref 100–199)
HDL: 32 mg/dL — ABNORMAL LOW (ref >39)
LDL Chol Calc (NIH): 101 mg/dL — ABNORMAL HIGH (ref 0–99)
Triglycerides: 128 mg/dL (ref 0–149)
VLDL Cholesterol Cal: 23 mg/dL (ref 5–40)

## 2021-08-22 LAB — AST: AST: 23 IU/L (ref 0–40)

## 2021-08-22 LAB — ALT: ALT: 22 IU/L (ref 0–44)

## 2021-08-26 ENCOUNTER — Telehealth: Payer: Self-pay

## 2021-10-01 ENCOUNTER — Other Ambulatory Visit: Payer: Self-pay | Admitting: Cardiology

## 2021-10-06 ENCOUNTER — Other Ambulatory Visit: Payer: Self-pay | Admitting: Cardiology

## 2022-05-27 ENCOUNTER — Encounter: Payer: Self-pay | Admitting: Cardiology

## 2022-05-27 ENCOUNTER — Ambulatory Visit: Payer: Medicare Other | Attending: Cardiology | Admitting: Cardiology

## 2022-05-27 VITALS — BP 130/74 | HR 80 | Ht 68.0 in | Wt 182.0 lb

## 2022-05-27 DIAGNOSIS — E785 Hyperlipidemia, unspecified: Secondary | ICD-10-CM

## 2022-05-27 DIAGNOSIS — I2510 Atherosclerotic heart disease of native coronary artery without angina pectoris: Secondary | ICD-10-CM

## 2022-05-27 DIAGNOSIS — Z9861 Coronary angioplasty status: Secondary | ICD-10-CM | POA: Diagnosis present

## 2022-05-27 DIAGNOSIS — I10 Essential (primary) hypertension: Secondary | ICD-10-CM | POA: Diagnosis not present

## 2022-05-27 DIAGNOSIS — F17210 Nicotine dependence, cigarettes, uncomplicated: Secondary | ICD-10-CM

## 2022-05-27 NOTE — Addendum Note (Signed)
Addended by: Jacobo Forest D on: 05/27/2022 02:51 PM   Modules accepted: Orders

## 2022-05-27 NOTE — Patient Instructions (Signed)
Medication Instructions:  Your physician recommends that you continue on your current medications as directed. Please refer to the Current Medication list given to you today.  *If you need a refill on your cardiac medications before your next appointment, please call your pharmacy*   Lab Work: Lipid, AST, ALT- today If you have labs (blood work) drawn today and your tests are completely normal, you will receive your results only by: [place] (if you have MyChart) OR A paper copy in the mail If you have any lab test that is abnormal or we need to change your treatment, we will call you to review the results.   Testing/Procedures: Your physician has requested that you have a carotid duplex. This test is an ultrasound of the carotid arteries in your neck. It looks at blood flow through these arteries that supply the brain with blood. Allow one hour for this exam. There are no restrictions or special instructions.    Follow-Up: At [facility], you and your health needs are our priority.  As part of our continuing mission to provide you with exceptional heart care, we have created designated Provider Care Teams.  These Care Teams include your primary Cardiologist (physician) and Advanced Practice Providers (APPs -  Physician Assistants and Nurse Practitioners) who all work together to provide you with the care you need, when you need it.  We recommend signing up for the patient portal called "MyChart".  Sign up information is provided on this After Visit Summary.  MyChart is used to connect with patients for Virtual Visits (Telemedicine).  Patients are able to view lab/test results, encounter notes, upcoming appointments, etc.  Non-urgent messages can be sent to your provider as well.   To learn more about what you can do with MyChart, go to [email or web address].    Your next appointment:   6 month(s)  The format for your next appointment:   In Person  Provider:   Jenne Campus, MD    Other Instructions NA

## 2022-05-27 NOTE — Progress Notes (Signed)
Cardiology Office Note:    Date:  05/27/2022   ID:  [name] Dickerson, DOB Jun 26, 1945, MRN [id number]  PCP:  Bonnita Nasuti, MD  Cardiologist:  Jenne Campus, MD    Referring MD: Bonnita Nasuti, MD   Chief Complaint  Patient presents with   Follow-up  Doing fine  History of Present Illness:    [name] Dickerson is a 77 y.o. male with past medical history significant for advanced atherosclerosis, status post carotic endarterectomy, status post MI cardiac function, status post multiple stents and interventions on the right coronary artery, echocardiogram done in May of last year showing preserved left ventricle ejection fraction, also dyslipidemia was taking Repatha and Zetia for some reason reported this to be from list of his medication, essential hypertension, smoking which is still ongoing, he tells me that smoking cigarettes is expensive but he is purchasing tobacco himself and role cigarettes himself which is making to continue to smoke. Denies have any symptoms, there is no chest pain tightness squeezing pressure burning chest he can walk and climb stairs with no difficulties.  Past Medical History:  Diagnosis Date   CAD S/P percutaneous coronary angioplasty 1995, 2003   Inferior STEMI-PTCA RCA; 2003: Overlapping MultiLink Zeta BMS -RCA (4.0 x 23, 4.0 x 15); 09/2001 Cutting Balloon PTCA of stents for ISR.;; Myoview August 2013 - Mod-severe sized fixed INFERIOR perfusion defect => likely infarct with mild peri-infarct ischemia in the apical inferior wall, EF 55% with distal inferior hypokinesis   Essential hypertension    Hyperlipidemia LDL goal <70    Hypothyroidism    Low testosterone    Moderate cigarette smoker (10-19 per day)    Was down to 5 cigarettes a day.   ST elevation myocardial infarction (STEMI) of inferior wall, subsequent episode of care [facility]) 1995   PTCA of RCA; large inferior scar noted on Myoview. Mid inferior and inferoseptal hypokinesis on echocardiogram November  2014. EF 50-55%.   Stenosis of coronary stent 2012   Mid RCA Occlusion with Right-To-Left and Left-To-Right Collaterals.; Relatively normal LAD, ramus intermedius and circumflex-none.    Past Surgical History:  Procedure Laterality Date   CARDIAC CATHETERIZATION  March 2012   Mid occlusion of RCA with Right to Left and Left to Right collaterals; LAD, ramus and circumflex/OM widely patent   CORONARY ANGIOPLASTY  1995   Inferior STEMI-PTCA of RCA   CORONARY STENT PLACEMENT  2003   MultiLink Zeta BMS 4.0 mm x 23 mm, 4.0 mm x 15 mm --> Cutting Balloon PTCA for ISR several months later.   LexiScan Myoview  August 2013   Moderate-severe sized INFERIOR perfusion defect with no evidence of ischemia. EF 55% with distal inferior HK. Large sized Inferior and Inferolateral Infarct with perhaps mild apical inferior ischemia.   TRANSTHORACIC ECHOCARDIOGRAM  01/09/2013   EF 50-55%; normal LV size and function. Probable mild HJ of the mid inferior and inferoseptal wall.; No valvular lesions.    Current Medications: Current Meds  Medication Sig   ALPRAZolam (XANAX) 0.5 MG tablet Take 0.5 mg by mouth at bedtime.   Cinnamon 500 MG TABS Take 2,000 mg by mouth 2 (two) times daily.   clopidogrel (PLAVIX) 75 MG tablet Take 1 tablet (75 mg total) by mouth daily with breakfast.   cyanocobalamin 1000 MCG tablet Take 1,000 mcg by mouth daily.   Dulaglutide (TRULICITY) A999333 0000000 SOPN Inject 0.75 mg into the skin once a week.   furosemide (LASIX) 20 MG tablet TAKE ONE TABLET BY  MOUTH TUESDAY, THURSDAY, AND SATURDAY   levothyroxine (SYNTHROID, LEVOTHROID) 200 MCG tablet Take 200 mcg by mouth daily before breakfast.   lisinopril (ZESTRIL) 30 MG tablet Take 1 tablet (30 mg total) by mouth in the morning and at bedtime.   meloxicam (MOBIC) 15 MG tablet Take 15 mg by mouth daily.   metoprolol tartrate (LOPRESSOR) 50 MG tablet TAKE 1 TABLET BY MOUTH TWICE A DAY   nitroGLYCERIN (NITROSTAT) 0.4 MG SL tablet Place 1  tablet (0.4 mg total) under the tongue every 5 (five) minutes as needed for chest pain.   potassium chloride SA (KLOR-CON M20) 20 MEQ tablet TAKE ONE TABLET ON TUESDAY, THURSDAY AND SATURDAY   traMADol (ULTRAM) 50 MG tablet Take 50 mg by mouth as needed for moderate pain or severe pain.     Allergies:   Statins   Social History   Socioeconomic History   Marital status: Married    Spouse name: Not on file   Number of children: Not on file   Years of education: Not on file   Highest education level: Not on file  Occupational History   Not on file  Tobacco Use   Smoking status: Every Day    Packs/day: 1.00    Years: 40.00    Additional pack years: 0.00    Total pack years: 40.00    Types: Cigarettes   Smokeless tobacco: Never  Substance and Sexual Activity   Alcohol use: No   Drug use: No   Sexual activity: Not on file  Other Topics Concern   Not on file  Social History Narrative   Married father of 74, grandfather 85. Works out occasionally on the treadmill. Coping to try get back into some exercise now. Unfortunately back up to smoking one pack per day. Does not drink alcohol.      Date from from 24 or 6 cigarettes a day. He's tried electronic cigarettes and down without affect.   Social Determinants of Health   Financial Resource Strain: Not on file  Food Insecurity: Not on file  Transportation Needs: Not on file  Physical Activity: Not on file  Stress: Not on file  Social Connections: Not on file     Family History: [name] family history is not on file. ROS:   Please see the history of present illness.    All 14 point review of systems negative except as described per history of present illness  EKGs/Labs/Other Studies Reviewed:      Recent Labs: 07/04/2021: BUN 12; Creatinine, Ser 1.24; Potassium 4.5; Sodium 136 08/21/2021: ALT 22  Recent Lipid Panel    Component Value Date/Time   CHOL 156 08/21/2021 0919   TRIG 128 08/21/2021 0919   HDL 32 (L)  08/21/2021 0919   CHOLHDL 4.9 08/21/2021 0919   CHOLHDL 5.3 05/15/2010 1048   VLDL 49 (H) 05/15/2010 1048   LDLCALC 101 (H) 08/21/2021 0919   LDLDIRECT 121 (H) 07/04/2021 0933    Physical Exam:    VS:  BP 130/74 (BP Location: Left Arm, Patient Position: Sitting, Cuff Size: Normal)   Pulse 80   Ht 5\' 8"  (1.727 m)   Wt 182 lb (82.6 kg)   SpO2 96%   BMI 27.67 kg/m     Wt Readings from Last 3 Encounters:  05/27/22 182 lb (82.6 kg)  07/04/21 193 lb (87.5 kg)  07/02/20 190 lb 3.2 oz (86.3 kg)     GEN:  Well nourished, well developed in no acute distress HEENT: Normal  NECK: No JVD; No carotid bruits LYMPHATICS: No lymphadenopathy CARDIAC: RRR, no murmurs, no rubs, no gallops RESPIRATORY:  Clear to auscultation without rales, wheezing or rhonchi  ABDOMEN: Soft, non-tender, non-distended MUSCULOSKELETAL:  No edema; No deformity  SKIN: Warm and dry LOWER EXTREMITIES: no swelling NEUROLOGIC:  Alert and oriented x 3 PSYCHIATRIC:  Normal affect   ASSESSMENT:    1. CAD S/P percutaneous coronary angioplasty   2. Essential hypertension   3. Cigarette smoker   4. Hyperlipidemia LDL goal <70    PLAN:    In order of problems listed above:  Coronary artery disease status post PTCA and stenting many times recently in no symptoms suggesting reactivation of the problem, he is on antiplatelet therapy in form of Plavix which I will continue. Dyslipidemia he is taking only Zetia.  Repatha disappeared from the list of medication.  I will ask him to have fasting lipid profile done and I am convinced that we have to go back on the medication. Essential hypertension: Blood pressure well-controlled continue present management. Smoking we spent real time talking about this and I strongly recommend to quit he tells me that it would be unlikely he will but he will think it over. History of peripheral vascular disease we will schedule him to have carotic ultrasound   Medication Adjustments/Labs  and Tests Ordered: Current medicines are reviewed at length with the patient today.  Concerns regarding medicines are outlined above.  No orders of the defined types were placed in this encounter.  Medication changes: No orders of the defined types were placed in this encounter.   Signed, Park Liter, MD, [name] 05/27/2022 2:42 PM    [facility]

## 2022-05-28 LAB — LIPID PANEL
Chol/HDL Ratio: 5.3 ratio — ABNORMAL HIGH (ref 0.0–5.0)
Cholesterol, Total: 160 mg/dL (ref 100–199)
HDL: 30 mg/dL — ABNORMAL LOW (ref >39)
LDL Chol Calc (NIH): 96 mg/dL (ref 0–99)
Triglycerides: 195 mg/dL — ABNORMAL HIGH (ref 0–149)
VLDL Cholesterol Cal: 34 mg/dL (ref 5–40)

## 2022-05-28 LAB — AST: AST: 18 IU/L (ref 0–40)

## 2022-05-28 LAB — ALT: ALT: 18 IU/L (ref 0–44)

## 2022-06-02 ENCOUNTER — Telehealth: Payer: Self-pay

## 2022-06-02 MED ORDER — REPATHA 140 MG/ML ~~LOC~~ SOSY: 140.0000 mg | PREFILLED_SYRINGE | SUBCUTANEOUS | 3 refills | Status: DC

## 2022-06-02 NOTE — Telephone Encounter (Signed)
-----   Message from Park Liter, MD sent at 06/02/2022  9:06 AM EDT ----- That is perfectly fine ----- Message ----- From: Darrel Reach, CMA Sent: 05/29/2022  10:00 AM EDT To: Park Liter, MD  S/w pt he said he will be willing to go back on Repatha but, is willing to obtain this through the lipid clinic. He said if you will prescribe this he will take otherwise it's a no. Is this ok? ----- Message ----- From: Park Liter, MD Sent: 05/28/2022   1:05 PM EDT To: Tyler Pita, RN  He need to go back on the [place], I do know if he can make arrangements for this if he cannot he is to be sent to lipid clinic

## 2022-06-02 NOTE — Telephone Encounter (Signed)
Patient notified of results and recommendations and agreed with plan. Medication sent to confirmed pharmacy,

## 2022-06-02 NOTE — Telephone Encounter (Signed)
-----   Message from Park Liter, MD sent at 06/02/2022  9:06 AM EDT ----- That is perfectly fine ----- Message ----- From: Darrel Reach, CMA Sent: 05/29/2022  10:00 AM EDT To: Park Liter, MD  S/w pt he said he will be willing to go back on Repatha but, is willing to obtain this through the lipid clinic. He said if you will prescribe this he will take otherwise it's a no. Is this ok? ----- Message ----- From: Park Liter, MD Sent: 05/28/2022   1:05 PM EDT To: Tyler Pita, RN  He need to go back on the [place], I do know if he can make arrangements for this if he cannot he is to be sent to lipid clinic

## 2022-06-03 ENCOUNTER — Telehealth: Payer: Self-pay

## 2022-06-03 NOTE — Telephone Encounter (Signed)
Prior Auth for Repatha sent to Prior Auth team.

## 2022-06-03 NOTE — Telephone Encounter (Signed)
-----   Message from Maralyn Sago, CPhT sent at 06/03/2022 11:12 AM EDT ----- Regarding: RE: Catharine Patient Advocate Encounter  Prior Authorization for Repatha 140mg /ml has been approved by [facility] (ins).    PA # [id number] Effective dates: 1.1.24 through 12.31.24    ----- Message ----- From: Darrel Reach, CMA Sent: 06/03/2022   9:55 AM EDT To: Rx Prior Auth Team Subject: Repatha                                        Prior Auth request from cover my meds.  Key#[id number] Satin Intolerance  ICD10: I25.10, E78.5, I21.9  Thanks

## 2022-06-03 NOTE — Telephone Encounter (Signed)
LM to return my call. 

## 2022-06-04 NOTE — Telephone Encounter (Signed)
Patient notified of approvals.

## 2022-06-04 NOTE — Telephone Encounter (Signed)
Patient returned call

## 2022-06-08 ENCOUNTER — Ambulatory Visit: Payer: Medicare Other

## 2022-06-24 ENCOUNTER — Ambulatory Visit: Payer: Medicare Other | Attending: Cardiology

## 2022-06-24 DIAGNOSIS — I2510 Atherosclerotic heart disease of native coronary artery without angina pectoris: Secondary | ICD-10-CM | POA: Insufficient documentation

## 2022-06-24 DIAGNOSIS — Z9861 Coronary angioplasty status: Secondary | ICD-10-CM | POA: Diagnosis not present

## 2022-07-03 ENCOUNTER — Telehealth: Payer: Self-pay

## 2022-07-03 NOTE — Telephone Encounter (Signed)
Patient notified of results.

## 2022-07-03 NOTE — Telephone Encounter (Signed)
-----   Message from Georgeanna Lea, MD sent at 07/01/2022  1:56 PM EDT ----- Carotic ultrasound show up to 39% stenosis bilaterally, medical therapy

## 2022-09-16 ENCOUNTER — Observation Stay (HOSPITAL_COMMUNITY)
Admission: RE | Admit: 2022-09-16 | Discharge: 2022-09-17 | Disposition: A | Discharge status: Home / Self Care | Payer: Medicare Other | Source: Other Acute Inpatient Hospital | Attending: Cardiology | Admitting: Cardiology

## 2022-09-16 ENCOUNTER — Encounter (HOSPITAL_COMMUNITY): Payer: Self-pay

## 2022-09-16 ENCOUNTER — Telehealth: Payer: Self-pay

## 2022-09-16 ENCOUNTER — Ambulatory Visit: Payer: Medicare Other | Admitting: Cardiology

## 2022-09-16 ENCOUNTER — Other Ambulatory Visit: Payer: Self-pay

## 2022-09-16 DIAGNOSIS — F17210 Nicotine dependence, cigarettes, uncomplicated: Secondary | ICD-10-CM | POA: Diagnosis present

## 2022-09-16 DIAGNOSIS — I10 Essential (primary) hypertension: Secondary | ICD-10-CM

## 2022-09-16 DIAGNOSIS — Z9861 Coronary angioplasty status: Secondary | ICD-10-CM

## 2022-09-16 DIAGNOSIS — E039 Hypothyroidism, unspecified: Secondary | ICD-10-CM | POA: Diagnosis not present

## 2022-09-16 DIAGNOSIS — I214 Non-ST elevation (NSTEMI) myocardial infarction: Principal | ICD-10-CM | POA: Diagnosis present

## 2022-09-16 DIAGNOSIS — E785 Hyperlipidemia, unspecified: Secondary | ICD-10-CM | POA: Diagnosis present

## 2022-09-16 DIAGNOSIS — Z955 Presence of coronary angioplasty implant and graft: Secondary | ICD-10-CM | POA: Insufficient documentation

## 2022-09-16 DIAGNOSIS — E119 Type 2 diabetes mellitus without complications: Secondary | ICD-10-CM | POA: Insufficient documentation

## 2022-09-16 DIAGNOSIS — Z79899 Other long term (current) drug therapy: Secondary | ICD-10-CM | POA: Diagnosis not present

## 2022-09-16 DIAGNOSIS — I2510 Atherosclerotic heart disease of native coronary artery without angina pectoris: Secondary | ICD-10-CM

## 2022-09-16 HISTORY — DX: Non-ST elevation (NSTEMI) myocardial infarction: I21.4

## 2022-09-16 LAB — CBC WITH DIFFERENTIAL/PLATELET
Abs Immature Granulocytes: 0.02 10*3/uL (ref 0.00–0.07)
Basophils Absolute: 0 10*3/uL (ref 0.0–0.1)
Basophils Relative: 1 %
Eosinophils Absolute: 0.1 10*3/uL (ref 0.0–0.5)
Eosinophils Relative: 2 %
HCT: 45.9 % (ref 39.0–52.0)
Hemoglobin: 15.3 g/dL (ref 13.0–17.0)
Immature Granulocytes: 0 %
Lymphocytes Relative: 29 %
Lymphs Abs: 1.8 10*3/uL (ref 0.7–4.0)
MCH: 30.7 pg (ref 26.0–34.0)
MCHC: 33.3 g/dL (ref 30.0–36.0)
MCV: 92.2 fL (ref 80.0–100.0)
Monocytes Absolute: 0.5 10*3/uL (ref 0.1–1.0)
Monocytes Relative: 7 %
Neutro Abs: 3.8 10*3/uL (ref 1.7–7.7)
Neutrophils Relative %: 61 %
Platelets: 125 10*3/uL — ABNORMAL LOW (ref 150–400)
RBC: 4.98 MIL/uL (ref 4.22–5.81)
RDW: 14 % (ref 11.5–15.5)
WBC: 6.1 10*3/uL (ref 4.0–10.5)
nRBC: 0 % (ref 0.0–0.2)

## 2022-09-16 LAB — COMPREHENSIVE METABOLIC PANEL
ALT: 25 U/L (ref 0–44)
AST: 54 U/L — ABNORMAL HIGH (ref 15–41)
Albumin: 3.9 g/dL (ref 3.5–5.0)
Alkaline Phosphatase: 52 U/L (ref 38–126)
Anion gap: 12 (ref 5–15)
BUN: 13 mg/dL (ref 8–23)
CO2: 25 mmol/L (ref 22–32)
Calcium: 9.2 mg/dL (ref 8.9–10.3)
Chloride: 99 mmol/L (ref 98–111)
Creatinine, Ser: 1.1 mg/dL (ref 0.61–1.24)
GFR, Estimated: >60 mL/min (ref >60)
Glucose, Bld: 109 mg/dL — ABNORMAL HIGH (ref 70–99)
Potassium: 3.9 mmol/L (ref 3.5–5.1)
Sodium: 136 mmol/L (ref 135–145)
Total Bilirubin: 0.7 mg/dL (ref 0.3–1.2)
Total Protein: 6.6 g/dL (ref 6.5–8.1)

## 2022-09-16 LAB — TROPONIN I (HIGH SENSITIVITY)
Troponin I (High Sensitivity): 7376 ng/L (ref <18)
Troponin I (High Sensitivity): 7538 ng/L (ref <18)

## 2022-09-16 LAB — GLUCOSE, CAPILLARY: Glucose-Capillary: 149 mg/dL — ABNORMAL HIGH (ref 70–99)

## 2022-09-16 LAB — MAGNESIUM: Magnesium: 1.6 mg/dL — ABNORMAL LOW (ref 1.7–2.4)

## 2022-09-16 LAB — TSH: TSH: 0.583 u[IU]/mL (ref 0.350–4.500)

## 2022-09-16 LAB — BRAIN NATRIURETIC PEPTIDE: B Natriuretic Peptide: 139.8 pg/mL — ABNORMAL HIGH (ref 0.0–100.0)

## 2022-09-16 LAB — HEPARIN LEVEL (UNFRACTIONATED): Heparin Unfractionated: 0.61 IU/mL (ref 0.30–0.70)

## 2022-09-16 MED ORDER — ISOSORBIDE MONONITRATE ER 30 MG PO TB24: 30.0000 mg | ORAL_TABLET | Freq: Every day | ORAL | 3 refills | Status: DC

## 2022-09-16 MED ORDER — HEPARIN (PORCINE) 25000 UT/250ML-% IV SOLN: 1150.0000 [IU]/h | INTRAVENOUS | Status: DC

## 2022-09-16 MED ORDER — ASPIRIN 300 MG RE SUPP
300.0000 mg | RECTAL | Status: DC
  Filled 2022-09-16: qty 1

## 2022-09-16 MED ORDER — LISINOPRIL 20 MG PO TABS
30.0000 mg | ORAL_TABLET | Freq: Every day | ORAL | Status: DC
  Administered 2022-09-16: 30 mg via ORAL
  Filled 2022-09-16: qty 1

## 2022-09-16 MED ORDER — METOPROLOL TARTRATE 50 MG PO TABS
50.0000 mg | ORAL_TABLET | Freq: Two times a day (BID) | ORAL | Status: DC
  Administered 2022-09-16: 50 mg via ORAL
  Filled 2022-09-16: qty 1

## 2022-09-16 MED ORDER — ALPRAZOLAM 0.5 MG PO TABS
0.5000 mg | ORAL_TABLET | Freq: Every evening | ORAL | Status: DC | PRN
  Administered 2022-09-16: 0.5 mg via ORAL
  Filled 2022-09-16: qty 1

## 2022-09-16 MED ORDER — SODIUM CHLORIDE 0.9 % WEIGHT BASED INFUSION
1.0000 mL/kg/h | INTRAVENOUS | Status: DC
  Administered 2022-09-17: 1 mL/kg/h via INTRAVENOUS

## 2022-09-16 MED ORDER — SODIUM CHLORIDE 0.9 % WEIGHT BASED INFUSION
3.0000 mL/kg/h | INTRAVENOUS | Status: AC
  Administered 2022-09-17: 3 mL/kg/h via INTRAVENOUS

## 2022-09-16 MED ORDER — ONDANSETRON HCL 4 MG/2ML IJ SOLN: 4.0000 mg | Freq: Four times a day (QID) | INTRAMUSCULAR | Status: DC | PRN

## 2022-09-16 MED ORDER — LEVOTHYROXINE SODIUM 100 MCG PO TABS
200.0000 ug | ORAL_TABLET | Freq: Every day | ORAL | Status: DC
  Administered 2022-09-17: 200 ug via ORAL
  Filled 2022-09-16: qty 2

## 2022-09-16 MED ORDER — NITROGLYCERIN 0.4 MG SL SUBL: 0.4000 mg | SUBLINGUAL_TABLET | SUBLINGUAL | Status: DC | PRN

## 2022-09-16 MED ORDER — MAGNESIUM SULFATE 2 GM/50ML IV SOLN
2.0000 g | Freq: Once | INTRAVENOUS | Status: AC
  Administered 2022-09-16: 2 g via INTRAVENOUS
  Filled 2022-09-16: qty 50

## 2022-09-16 MED ORDER — ACETAMINOPHEN 325 MG PO TABS: 650.0000 mg | ORAL_TABLET | ORAL | Status: DC | PRN

## 2022-09-16 MED ORDER — ASPIRIN 81 MG PO CHEW
81.0000 mg | CHEWABLE_TABLET | ORAL | Status: AC
  Administered 2022-09-17: 81 mg via ORAL
  Filled 2022-09-16: qty 1

## 2022-09-16 MED ORDER — ASPIRIN 81 MG PO CHEW: 324.0000 mg | CHEWABLE_TABLET | ORAL | Status: DC

## 2022-09-16 MED ORDER — ASPIRIN 81 MG PO TBEC
81.0000 mg | DELAYED_RELEASE_TABLET | Freq: Every day | ORAL | Status: DC
  Filled 2022-09-16: qty 1

## 2022-09-16 MED ORDER — NITROGLYCERIN 0.4 MG SL SUBL: 0.4000 mg | SUBLINGUAL_TABLET | SUBLINGUAL | 2 refills | Status: AC | PRN

## 2022-09-16 MED ORDER — CLOPIDOGREL BISULFATE 75 MG PO TABS: 75.0000 mg | ORAL_TABLET | Freq: Every day | ORAL | Status: DC

## 2022-09-16 MED ORDER — INSULIN ASPART 100 UNIT/ML IJ SOLN
0.0000 [IU] | Freq: Three times a day (TID) | INTRAMUSCULAR | Status: DC
  Administered 2022-09-17: 1 [IU] via SUBCUTANEOUS

## 2022-09-16 MED ORDER — ISOSORBIDE MONONITRATE ER 30 MG PO TB24
30.0000 mg | ORAL_TABLET | Freq: Every day | ORAL | Status: DC
  Administered 2022-09-16: 30 mg via ORAL
  Filled 2022-09-16: qty 1

## 2022-09-16 MED ORDER — NICOTINE POLACRILEX 2 MG MT GUM: 2.0000 mg | CHEWING_GUM | OROMUCOSAL | Status: DC | PRN

## 2022-09-16 NOTE — Interval H&P Note (Signed)
History and Physical Interval Note:  09/16/2022 5:59 PM  [name] Dickerson  has presented today for surgery, with the diagnosis of chest pain.  The various methods of treatment have been discussed with the patient and family. After consideration of risks, benefits and other options for treatment, the patient has consented to  Procedure(s): LEFT HEART CATH AND CORONARY ANGIOGRAPHY (N/A) as a surgical intervention.  [name] history has been reviewed, patient examined, no change in status, stable for surgery.  I have reviewed the patient's chart and labs.  Questions were answered to the patient's satisfaction.     [name]

## 2022-09-16 NOTE — Progress Notes (Signed)
Cardiology Office Note:    Date:  09/16/2022   ID:  [name] Dickerson, DOB [birth year], MRN [id number]  PCP:  Galvin Proffer, MD  Cardiologist:  Gypsy Balsam, MD    Referring MD: Galvin Proffer, MD   No chief complaint on file. The chest pain yesterday  History of Present Illness:    [name] Dickerson is a 77 y.o. male with very complex past medical history.  He was taken care of by Dr. Antony Contras medical history include advanced atherosclerosis with multiple intervention on his coronary arteries.  It look like first event was in 1995.  In 2003 when he got PTCA and stent to right coronary artery in view of STEMI at that time he received multiple overlapping stents.  Last evaluation of his coronary artery status was done in 2021 when he had a stress test done.  Stress test was consistent with prior myocardial infarction but no evidence of ischemia.  Ejection fraction was 45%.  Additional problems include dyslipidemia he is taking Repatha and Zetia, essential hypertension, smoking with sadly still ongoing I did see him last time in March which he was doing quite well but for last few days he started experiencing chest pain he had episode of chest pain last night at 1:00 in the morning he called EMS EMS came after him hospitalization he refused he walked into our office today.  EKG did not show any acute changes however he was sent for stat troponin I and it troponin is 16.7.  He was recommended to go to the emergency room to be admitted and evaluated.  Cardiac History: Inferior STEMI in 1995 -- PTCA of RCA 04/2001: Recurrent angina - overlapping BMS x 2 mRCA 09/2001: Cutting Balloon PTCA for ISR 2012: 100% mRCA occlusion, R-R bridging & L-R collaterals 2013: Myoview with Large Inferior "Infarct" w/ mild per-infarct ischemia 12/2012: Echo: EF 50-55%, distal Inferior HK  Past Medical History:  Diagnosis Date   CAD S/P percutaneous coronary angioplasty 1995, 2003   Inferior STEMI-PTCA RCA; 2003:  Overlapping MultiLink Zeta BMS -RCA (4.0 x 23, 4.0 x 15); 09/2001 Cutting Balloon PTCA of stents for ISR.;; Myoview August 2013 - Mod-severe sized fixed INFERIOR perfusion defect => likely infarct with mild peri-infarct ischemia in the apical inferior wall, EF 55% with distal inferior hypokinesis   Essential hypertension    Hyperlipidemia LDL goal <70    Hypothyroidism    Low testosterone    Moderate cigarette smoker (10-19 per day)    Was down to 5 cigarettes a day.   ST elevation myocardial infarction (STEMI) of inferior wall, subsequent episode of care [facility]) 1995   PTCA of RCA; large inferior scar noted on Myoview. Mid inferior and inferoseptal hypokinesis on echocardiogram November 2014. EF 50-55%.   Stenosis of coronary stent 2012   Mid RCA Occlusion with Right-To-Left and Left-To-Right Collaterals.; Relatively normal LAD, ramus intermedius and circumflex-none.    Past Surgical History:  Procedure Laterality Date   CARDIAC CATHETERIZATION  March 2012   Mid occlusion of RCA with Right to Left and Left to Right collaterals; LAD, ramus and circumflex/OM widely patent   CORONARY ANGIOPLASTY  1995   Inferior STEMI-PTCA of RCA   CORONARY STENT PLACEMENT  2003   MultiLink Zeta BMS 4.0 mm x 23 mm, 4.0 mm x 15 mm --> Cutting Balloon PTCA for ISR several months later.   LexiScan Myoview  August 2013   Moderate-severe sized INFERIOR perfusion defect with no evidence of ischemia. EF 55%  with distal inferior HK. Large sized Inferior and Inferolateral Infarct with perhaps mild apical inferior ischemia.   TRANSTHORACIC ECHOCARDIOGRAM  01/09/2013   EF 50-55%; normal LV size and function. Probable mild HJ of the mid inferior and inferoseptal wall.; No valvular lesions.    Current Medications: Current Meds  Medication Sig   isosorbide mononitrate (IMDUR) 30 MG 24 hr tablet Take 1 tablet (30 mg total) by mouth daily.     Allergies:   Statins   Social History   Socioeconomic History   Marital  status: Married    Spouse name: Not on file   Number of children: Not on file   Years of education: Not on file   Highest education level: Not on file  Occupational History   Not on file  Tobacco Use   Smoking status: Every Day    Current packs/day: 1.00    Average packs/day: 1 pack/day for 40.0 years (40.0 ttl pk-yrs)    Types: Cigarettes   Smokeless tobacco: Never  Substance and Sexual Activity   Alcohol use: No   Drug use: No   Sexual activity: Not on file  Other Topics Concern   Not on file  Social History Narrative   Married father of 3, grandfather 5. Works out occasionally on the treadmill. Coping to try get back into some exercise now. Unfortunately back up to smoking one pack per day. Does not drink alcohol.      Date from from 5 or 6 cigarettes a day. He's tried electronic cigarettes and down without affect.   Social Determinants of Health   Financial Resource Strain: Not on file  Food Insecurity: Not on file  Transportation Needs: Not on file  Physical Activity: Not on file  Stress: Not on file  Social Connections: Not on file     Family History: [name] family history is not on file. ROS:   Please see the history of present illness.    All 14 point review of systems negative except as described per history of present illness  EKGs/Labs/Other Studies Reviewed:         Recent Labs: 05/27/2022: ALT 18  Recent Lipid Panel    Component Value Date/Time   CHOL 160 05/27/2022 1458   TRIG 195 (H) 05/27/2022 1458   HDL 30 (L) 05/27/2022 1458   CHOLHDL 5.3 (H) 05/27/2022 1458   CHOLHDL 5.3 05/15/2010 1048   VLDL 49 (H) 05/15/2010 1048   LDLCALC 96 05/27/2022 1458   LDLDIRECT 121 (H) 07/04/2021 0933    Physical Exam:    VS:  There were no vitals taken for this visit.    Wt Readings from Last 3 Encounters:  05/27/22 182 lb (82.6 kg)  07/04/21 193 lb (87.5 kg)  07/02/20 190 lb 3.2 oz (86.3 kg)     GEN:  Well nourished, well developed in no  acute distress HEENT: Normal NECK: No JVD; No carotid bruits LYMPHATICS: No lymphadenopathy CARDIAC: RRR, no murmurs, no rubs, no gallops RESPIRATORY:  Clear to auscultation without rales, wheezing or rhonchi  ABDOMEN: Soft, non-tender, non-distended MUSCULOSKELETAL:  No edema; No deformity  SKIN: Warm and dry LOWER EXTREMITIES: no swelling NEUROLOGIC:  Alert and oriented x 3 PSYCHIATRIC:  Normal affect   ASSESSMENT:    1. CAD S/P percutaneous coronary angioplasty   2. Coronary artery disease, occlusive   3. Essential hypertension   4. Type 2 diabetes mellitus without complication, without long-term current use of insulin (HCC)   5. Cigarette smoker  PLAN:    In order of problems listed above:  Coronary disease in the months, troponin abnormal.  He will be referred to the emergency room for admission he will require CAD workup after appropriate therapy will be initiated.  He will require heparin. Essential hypertension blood pressure is elevated in the office.  Again he will be referred to emergency room for evaluation and proper treatment. Type 2 diabetes that being followed by antimedicine team. Smoking obviously huge problem, he was told to quit smoking.   Medication Adjustments/Labs and Tests Ordered: Current medicines are reviewed at length with the patient today.  Concerns regarding medicines are outlined above.  No orders of the defined types were placed in this encounter.  Medication changes:  Meds ordered this encounter  Medications   isosorbide mononitrate (IMDUR) 30 MG 24 hr tablet    Sig: Take 1 tablet (30 mg total) by mouth daily.    Dispense:  90 tablet    Refill:  3   nitroGLYCERIN (NITROSTAT) 0.4 MG SL tablet    Sig: Place 1 tablet (0.4 mg total) under the tongue every 5 (five) minutes as needed for chest pain.    Dispense:  25 tablet    Refill:  2    Signed, Georgeanna Lea, MD, [facility] 09/16/2022 12:05 PM    [facility]

## 2022-09-16 NOTE — Telephone Encounter (Signed)
Patient came by the office and stated that he had to call the EMT's last night because he was having chest pain. His blood pressure was 220/110 HR 60's. He stated that they told him to take a bayer aspirin. He took one and went out to the front yard and vomited. After the EMT's evaluated him they recommended he come by his cardiologist office for labs today. His blood pressure today is 137/60 HR 60's. Patient stated that he has been having chest pain in the center of his chest and in the middle of his back for around 2 weeks. Last night was the third time that it had occurred. He currently has no chest pain or discomfort at this time. Spoke with Dr. Bing Matter and he ordered an EKG and asked that the patient be added to clinic schedule. Patient was added to his clinic schedule and he went into the room and saw the patient.

## 2022-09-16 NOTE — Interval H&P Note (Signed)
History and Physical Interval Note:  09/16/2022 5:59 PM  [name] Dickerson  has presented today for surgery, with the diagnosis of chest pain.  The various methods of treatment have been discussed with the patient and family. After consideration of risks, benefits and other options for treatment, the patient has consented to  Procedure(s): LEFT HEART CATH AND CORONARY ANGIOGRAPHY (N/A) as a surgical intervention.  [name] history has been reviewed, patient examined, no change in status, stable for surgery.  I have reviewed the patient's chart and labs.  Questions were answered to the patient's satisfaction.    Cath Lab Visit (complete for each Cath Lab visit)  Clinical Evaluation Leading to the Procedure:   ACS: Yes.    Non-ACS:    Anginal Classification: CCS IV  Anti-ischemic medical therapy: Maximal Therapy (2 or more classes of medications)  Non-Invasive Test Results: No non-invasive testing performed  Prior CABG: No previous CABG        [name] Dickerson

## 2022-09-16 NOTE — Telephone Encounter (Signed)
Dr. Bing Matter stated that the patient's troponin level was high and he needed to go to the ER. Called the patient and informed him that his troponin level was 16 and Dr. Bing Matter recommended that he go to the ER. Patient stated that he was already in the ER and he had no further questions at this time.

## 2022-09-16 NOTE — Patient Instructions (Signed)
Medication Instructions:  Your physician has recommended you make the following change in your medication:   START: Imdur 30 mg daily START: Nitroglycerin 0.4 mg under the tongue every 5 minutes as needed for chest pain.  *If you need a refill on your cardiac medications before your next appointment, please call your pharmacy*   Lab Work: Patient is to have STAT troponin 1 blood draw at [facility].  If you have labs (blood work) drawn today and your tests are completely normal, you will receive your results only by: MyChart Message (if you have MyChart) OR A paper copy in the mail If you have any lab test that is abnormal or we need to change your treatment, we will call you to review the results.   Testing/Procedures: None   Follow-Up: At [facility], you and your health needs are our priority.  As part of our continuing mission to provide you with exceptional heart care, we have created designated Provider Care Teams.  These Care Teams include your primary Cardiologist (physician) and Advanced Practice Providers (APPs -  Physician Assistants and Nurse Practitioners) who all work together to provide you with the care you need, when you need it.  We recommend signing up for the patient portal called "MyChart".  Sign up information is provided on this After Visit Summary.  MyChart is used to connect with patients for Virtual Visits (Telemedicine).  Patients are able to view lab/test results, encounter notes, upcoming appointments, etc.  Non-urgent messages can be sent to your provider as well.   To learn more about what you can do with MyChart, go to [email or web address].    Your next appointment:   1 month(s)  Provider:   Gypsy Balsam, MD    Other Instructions None

## 2022-09-16 NOTE — H&P (Addendum)
Cardiology Admission History and Physical   Patient ID: [name] Dickerson MRN: [id number]; DOB: [birth year]   Admission date: 09/16/2022  PCP:  Galvin Proffer, MD   [facility] Providers Cardiologist:  Bryan Lemma, MD   {   Chief Complaint:  chest pain  Patient Profile:   [name] Dickerson is a 77 y.o. male with PMH of CAD with multiple PCIs,  HTN, HLD, type 2 DM, who is being seen 09/16/2022 for the evaluation of NSTEMI.  History of Present Illness:   [name] Dickerson with above PMH presented to Dr Bing Matter office for chest pain. He states he had 3 episodes of chest pain over the past 6 days. Pain is located at mid-sternum area, radiating to his back, occurs at night before he goes to bed. The first 2 episodes of chest pain lasted 15-20 minutes and he felt it was heart burn. Last night had had much severe resting chest pain, he drank some aspirin powder and had emesis x1, pain subsequently resolved after 2 hours. He called EMS and was advised go to ER, but he did not go. He saw Dr Bing Matter today, was sent to [facility] for ACS check up. Trop was noted elevated. He was therefore send to [facility] for heart cath. He is currently chest pain free. He denied  any SOB, dizziness, syncope, leg edema. He smokes 1.5 PPD. He denied ETOH or illicit drug use. He states his diabetes is generally controlled well, on a weekly injection. He states he is active and able to whatever he wishes, mobility sometimes limited by his hips pain, but able to walk up a flight of stairs or do yard work if he wants to at baseline.   At [facility], his labs work showed WBC 7500, Hgb 16.8, PLT 132k, INR 1. Na 137, K 4.4, BUN 16, Cr 1, GFR >60, AST elevated 98, ALT WNL 33. Pro BNP 721. Albumin 4.8 Trop 15.5 x1. CXR showed no acute findings. EKG showed sinus rhythm, PVCs, TWI of lead I and AVLseems to be new compairng to 2023 EKG.   Per chart review, he used to see Dr Herbie Baltimore, then transitioned to Dr Servando Salina since 2021 and  then Dr Bing Matter since 2023. He suffered initial inferior STEMI in 1995 , underwent PTCA of RCA; due to recurrent angina 04/2001, he was treated with overlapping BMS x 2 mRCA; 09/2001 he was treated with cutting balloon PTCA for ISR; in 2012, he was found to have 100% mRCA occlusion, R-R bridging and L-R collaterals; in 2013 he underwent stress myoview showing large large inferolateral infarct but no ischemia; in 10/10/2019 he underwent stress myoveiw again showing EF 45%, consistent with prior MI, intermediate risk study.   Last heart cath 05/16/2010 per report showed terminal eccentric 40% narrowing of the left main, Cx and ramus free of disease, proximal LAD was normal, distal 2/3rd was relatively small and there was a small diagonal branch. There was a stent in the midportion of the RCA that was chronically occluded.  There was some slow filling of the distal right via skipped collaterals around the stent and there was an exceptionally large collateral bed from both the circumflex and LAD, filling the RCA system.  He was maintained on Plavix 75mg  daily. He is statin intolerant with muscle ache, historically on repatha and zetia, stopped taking repartha a few times throughout the years. His most recent Echo was done 07/10/21 which showed LVEF 60-65%, no RWMA, mild LVH, grade I DD,  normal RV.     Past Medical History:  Diagnosis Date   CAD S/P percutaneous coronary angioplasty 1995, 2003   Inferior STEMI-PTCA RCA; 2003: Overlapping MultiLink Zeta BMS -RCA (4.0 x 23, 4.0 x 15); 09/2001 Cutting Balloon PTCA of stents for ISR.;; Myoview August 2013 - Mod-severe sized fixed INFERIOR perfusion defect => likely infarct with mild peri-infarct ischemia in the apical inferior wall, EF 55% with distal inferior hypokinesis   Essential hypertension    Hyperlipidemia LDL goal <70    Hypothyroidism    Low testosterone    Moderate cigarette smoker (10-19 per day)    Was down to 5 cigarettes a day.   ST elevation  myocardial infarction (STEMI) of inferior wall, subsequent episode of care [facility]) 1995   PTCA of RCA; large inferior scar noted on Myoview. Mid inferior and inferoseptal hypokinesis on echocardiogram November 2014. EF 50-55%.   Stenosis of coronary stent 2012   Mid RCA Occlusion with Right-To-Left and Left-To-Right Collaterals.; Relatively normal LAD, ramus intermedius and circumflex-none.    Past Surgical History:  Procedure Laterality Date   CARDIAC CATHETERIZATION  March 2012   Mid occlusion of RCA with Right to Left and Left to Right collaterals; LAD, ramus and circumflex/OM widely patent   CORONARY ANGIOPLASTY  1995   Inferior STEMI-PTCA of RCA   CORONARY STENT PLACEMENT  2003   MultiLink Zeta BMS 4.0 mm x 23 mm, 4.0 mm x 15 mm --> Cutting Balloon PTCA for ISR several months later.   LexiScan Myoview  August 2013   Moderate-severe sized INFERIOR perfusion defect with no evidence of ischemia. EF 55% with distal inferior HK. Large sized Inferior and Inferolateral Infarct with perhaps mild apical inferior ischemia.   TRANSTHORACIC ECHOCARDIOGRAM  01/09/2013   EF 50-55%; normal LV size and function. Probable mild HJ of the mid inferior and inferoseptal wall.; No valvular lesions.     Medications Prior to Admission: Prior to Admission medications   Medication Sig Start Date End Date Taking? Authorizing Provider  ALPRAZolam Prudy Feeler) 0.5 MG tablet Take 0.5 mg by mouth at bedtime. 08/18/19   [provider]  Cinnamon 500 MG TABS Take 2,000 mg by mouth 2 (two) times daily.    [provider]  clopidogrel (PLAVIX) 75 MG tablet Take 1 tablet (75 mg total) by mouth daily with breakfast. 10/01/21   Georgeanna Lea, MD  cyanocobalamin 1000 MCG tablet Take 1,000 mcg by mouth daily.    [provider]  Dulaglutide (TRULICITY) 0.75 MG/0.5ML SOPN Inject 0.75 mg into the skin once a week.    [provider]  Evolocumab (REPATHA) 140 MG/ML SOSY Inject 140 mg into the  skin every 14 (fourteen) days. 06/02/22   Georgeanna Lea, MD  furosemide (LASIX) 20 MG tablet TAKE ONE TABLET BY MOUTH TUESDAY, THURSDAY, AND SATURDAY 08/20/21   Georgeanna Lea, MD  isosorbide mononitrate (IMDUR) 30 MG 24 hr tablet Take 1 tablet (30 mg total) by mouth daily. 09/16/22   Georgeanna Lea, MD  levothyroxine (SYNTHROID, LEVOTHROID) 200 MCG tablet Take 200 mcg by mouth daily before breakfast.    [provider]  lisinopril (ZESTRIL) 30 MG tablet Take 1 tablet (30 mg total) by mouth in the morning and at bedtime. 07/02/20   Tobb, Kardie, DO  meloxicam (MOBIC) 15 MG tablet Take 15 mg by mouth daily. 08/23/19   [provider]  metoprolol tartrate (LOPRESSOR) 50 MG tablet TAKE 1 TABLET BY MOUTH TWICE A DAY 08/14/21  Georgeanna Lea, MD  nitroGLYCERIN (NITROSTAT) 0.4 MG SL tablet Place 1 tablet (0.4 mg total) under the tongue every 5 (five) minutes as needed for chest pain. 09/16/22   Georgeanna Lea, MD  potassium chloride SA (KLOR-CON M20) 20 MEQ tablet TAKE ONE TABLET ON Jake Shark, THURSDAY AND SATURDAY 08/20/21   Georgeanna Lea, MD  traMADol (ULTRAM) 50 MG tablet Take 50 mg by mouth as needed for moderate pain or severe pain. 01/17/19   [provider]     Allergies:    Allergies  Allergen Reactions   Statins Other (See Comments)    Soreness with Crestor    Social History:   Social History   Socioeconomic History   Marital status: Married    Spouse name: Not on file   Number of children: Not on file   Years of education: Not on file   Highest education level: Not on file  Occupational History   Not on file  Tobacco Use   Smoking status: Every Day    Current packs/day: 1.00    Average packs/day: 1 pack/day for 40.0 years (40.0 ttl pk-yrs)    Types: Cigarettes   Smokeless tobacco: Never  Substance and Sexual Activity   Alcohol use: No   Drug use: No   Sexual activity: Not on file  Other Topics Concern   Not on file  Social  History Narrative   Married father of 3, grandfather 5. Works out occasionally on the treadmill. Coping to try get back into some exercise now. Unfortunately back up to smoking one pack per day. Does not drink alcohol.      Date from from 5 or 6 cigarettes a day. He's tried electronic cigarettes and down without affect.   Social Determinants of Health   Financial Resource Strain: Not on file  Food Insecurity: Not on file  Transportation Needs: Not on file  Physical Activity: Not on file  Stress: Not on file  Social Connections: Not on file  Intimate Partner Violence: Not on file    Family History:   [name] family history is not on file.    ROS:  Constitutional: Denied fever, chills, malaise, night sweats Eyes: Denied vision change or loss Ears/Nose/Mouth/Throat: Denied ear ache, sore throat, coughing, sinus pain Cardiovascular: see HPI  Respiratory: see HPI  Gastrointestinal: see HPI  Genital/Urinary: Denied dysuria, hematuria, urinary frequency/urgency Musculoskeletal: Denied muscle ache, joint pain, weakness Skin: Denied rash, wound Neuro: Denied headache, dizziness, syncope Psych: Denied history of depression/anxiety  Endocrine: history of diabetes   Physical Exam/Data:   Vitals:   09/16/22 1654 09/16/22 1700  BP: 139/69   Resp: 18   Temp: 97.9 F (36.6 C)   TempSrc: Oral   Weight:  78.7 kg  Height:  5\' 8"  (1.727 m)   No intake or output data in the 24 hours ending 09/16/22 1757    09/16/2022    5:00 PM 05/27/2022    2:23 PM 07/04/2021    8:59 AM  Last 3 Weights  Weight (lbs) 173 lb 8 oz 182 lb 193 lb  Weight (kg) 78.699 kg 82.555 kg 87.544 kg     Body mass index is 26.38 kg/m.   Vitals:  Vitals:   09/16/22 1654  BP: 139/69  Resp: 18  Temp: 97.9 F (36.6 C)   General Appearance: In no apparent distress, laying in bed, well nourished  HEENT: Normocephalic, atraumatic.  Neck: Supple, trachea midline, no JVDs Cardiovascular: Regular rate and  rhythm, normal  S1-S2,  no murmur Respiratory: Resting breathing unlabored, lungs sounds clear to auscultation bilaterally, no use of accessory muscles. On room air.  No wheezes, rales or rhonchi.   Gastrointestinal: Bowel sounds positive, abdomen soft, non-tender, non-distended. \ Extremities: Able to move all extremities in bed without difficulty, no edema of BLE Musculoskeletal: Normal muscle bulk and tone Neurologic: Alert, oriented to person, place and time. Fluent speech, no cognitive deficit, no gross focal neuro deficit Psychiatric: Normal affect. Mood is appropriate.     EKG:    EKG today showed sinus rhythm, 66bpm, first degree AVB, TWI of lead I and AVL, new comparing to EKG from 07/04/21   Relevant CV Studies:   Echo from 07/10/21:   1. Left ventricular ejection fraction, by estimation, is 60 to 65%. The  left ventricle has normal function. The left ventricle has no regional  wall motion abnormalities. There is mild left ventricular hypertrophy.  Left ventricular diastolic parameters  are consistent with Grade I diastolic dysfunction (impaired relaxation).   2. Right ventricular systolic function is normal. The right ventricular  size is normal.   3. The mitral valve is normal in structure. No evidence of mitral valve  regurgitation. No evidence of mitral stenosis.   4. The aortic valve is normal in structure. Aortic valve regurgitation is  not visualized. No aortic stenosis is present.   5. The inferior vena cava is normal in size with greater than 50%  respiratory variability, suggesting right atrial pressure of 3 mmHg.    Laboratory Data:  High Sensitivity Troponin:  No results for input(s): "TROPONINIHS" in the last 720 hours.    ChemistryNo results for input(s): "NA", "K", "CL", "CO2", "GLUCOSE", "BUN", "CREATININE", "CALCIUM", "MG", "GFRNONAA", "GFRAA", "ANIONGAP" in the last 168 hours.  No results for input(s): "PROT", "ALBUMIN", "AST", "ALT", "ALKPHOS", "BILITOT"  in the last 168 hours. Lipids No results for input(s): "CHOL", "TRIG", "HDL", "LABVLDL", "LDLCALC", "CHOLHDL" in the last 168 hours. HematologyNo results for input(s): "WBC", "RBC", "HGB", "HCT", "MCV", "MCH", "MCHC", "RDW", "PLT" in the last 168 hours. Thyroid No results for input(s): "TSH", "FREET4" in the last 168 hours. BNPNo results for input(s): "BNP", "PROBNP" in the last 168 hours.  DDimer No results for input(s): "DDIMER" in the last 168 hours.   Radiology/Studies:  No results found.   Assessment and Plan:   NSTEMI CAD with hx of multiple PCIs  - patient presented with 6 days of intermittent resting chest pain, worsened last night, resolved with ASA and emesis , currently pain free  - EKG with non-specific TWI of lateral leads seems new today  - Trop elevated 15.5 x1 at [place], check Hs trop now  - Will admit to telemetry unit - Will check TSH, A1C, CBC diff, CMP, Mag, BNP, now, lipid panel and LPA tomorrow  - will continue heparin gtt, he was transferred here with heparin gtt, pharmacy to dose  - will hold PTA Plavix 75mg  daily, resume PTA imdur 30mg  daily, lisinopril 30mg  daily, metoprolol 50mg  BID, and zetia 10mg  daily, and PRN nitroglycerin, hold repatha  - LHC planned for tomorrow, NPO after midnight   Type 2 DM  - will check A1C - hold trulicity - start insulin AC SSI - [facility] diet   HTN - BP stable , resume PTA lisinopril and imdur and metoprolol   HLD - check lipid panel and LPA - resume Zetia, intolerant to statin, on Repatha   Tobacco use - recommended smoking cessation  Hypothyroidism - check TSH - Continue  PTA levothyroxine   Anxiety - on nightly xanax, will resume to avoid withdrawal   Leg/hip pain with ambulation  - check arterial doppler of lower extremity   DVT prophylaxis - on therapeutic heparin gtt  AD: Full code, confirmed at bedside, POA is his son Delton Coombes: likely in 1-2 days    Informed Consent   Shared Decision  Making/Informed Consent{  The risks [stroke (1 in 1000), death (1 in 1000), kidney failure [usually temporary] (1 in 500), bleeding (1 in 200), allergic reaction [possibly serious] (1 in 200)], benefits (diagnostic support and management of coronary artery disease) and alternatives of a cardiac catheterization were discussed in detail with Mr. Penza and he is willing to proceed.       Risk Assessment/Risk Scores:    TIMI Risk Score for Unstable Angina or Non-ST Elevation MI:   [name] TIMI risk score is 5, which indicates a 26% risk of all cause mortality, new or recurrent myocardial infarction or need for urgent revascularization in the next 14 days.{   Code Status: Full Code  Severity of Illness: The appropriate patient status for this patient is INPATIENT. Inpatient status is judged to be reasonable and necessary in order to provide the required intensity of service to ensure the patient's safety. [name] presenting symptoms, physical exam findings, and initial radiographic and laboratory data in the context of their chronic comorbidities is felt to place them at high risk for further clinical deterioration. Furthermore, it is not anticipated that the patient will be medically stable for discharge from the hospital within 2 midnights of admission.   * I certify that at the point of admission it is my clinical judgment that the patient will require inpatient hospital care spanning beyond 2 midnights from the point of admission due to high intensity of service, high risk for further deterioration and high frequency of surveillance required.*   For questions or updates, please contact [facility] Please consult [email or web address] for contact info under     Signed, Cyndi Bender, NP  09/16/2022 5:57 PM

## 2022-09-16 NOTE — Progress Notes (Signed)
ANTICOAGULATION CONSULT NOTE - Initial Consult  Pharmacy Consult for heparin Indication: chest pain/ACS  Allergies  Allergen Reactions   Statins Other (See Comments)    Soreness with Crestor    Patient Measurements: Height: 5\' 8"  (172.7 cm) Weight: 78.7 kg (173 lb 8 oz) IBW/kg (Calculated) : 68.4 Heparin Dosing Weight: 79kg  Vital Signs: Temp: 97.9 F (36.6 C) (07/17 1654) Temp Source: Oral (07/17 1654) BP: 139/69 (07/17 1654)  Labs: No results for input(s): "HGB", "HCT", "PLT", "APTT", "LABPROT", "INR", "HEPARINUNFRC", "HEPRLOWMOCWT", "CREATININE", "CKTOTAL", "CKMB", "TROPONINIHS" in the last 72 hours.  CrCl cannot be calculated (Patient's most recent lab result is older than the maximum 21 days allowed.).   Medical History: Past Medical History:  Diagnosis Date   CAD S/P percutaneous coronary angioplasty 1995, 2003   Inferior STEMI-PTCA RCA; 2003: Overlapping MultiLink Zeta BMS -RCA (4.0 x 23, 4.0 x 15); 09/2001 Cutting Balloon PTCA of stents for ISR.;; Myoview August 2013 - Mod-severe sized fixed INFERIOR perfusion defect => likely infarct with mild peri-infarct ischemia in the apical inferior wall, EF 55% with distal inferior hypokinesis   Essential hypertension    Hyperlipidemia LDL goal <70    Hypothyroidism    Low testosterone    Moderate cigarette smoker (10-19 per day)    Was down to 5 cigarettes a day.   ST elevation myocardial infarction (STEMI) of inferior wall, subsequent episode of care [facility]) 1995   PTCA of RCA; large inferior scar noted on Myoview. Mid inferior and inferoseptal hypokinesis on echocardiogram November 2014. EF 50-55%.   Stenosis of coronary stent 2012   Mid RCA Occlusion with Right-To-Left and Left-To-Right Collaterals.; Relatively normal LAD, ramus intermedius and circumflex-none.    Medications:  Medications Prior to Admission  Medication Sig Dispense Refill Last Dose   ALPRAZolam (XANAX) 0.5 MG tablet Take 0.5 mg by mouth at bedtime.       Cinnamon 500 MG TABS Take 2,000 mg by mouth 2 (two) times daily.      clopidogrel (PLAVIX) 75 MG tablet Take 1 tablet (75 mg total) by mouth daily with breakfast. 90 tablet 2    cyanocobalamin 1000 MCG tablet Take 1,000 mcg by mouth daily.      Dulaglutide (TRULICITY) 0.75 MG/0.5ML SOPN Inject 0.75 mg into the skin once a week.      Evolocumab (REPATHA) 140 MG/ML SOSY Inject 140 mg into the skin every 14 (fourteen) days. 2 mL 3    furosemide (LASIX) 20 MG tablet TAKE ONE TABLET BY MOUTH TUESDAY, THURSDAY, AND SATURDAY 36 tablet 3    isosorbide mononitrate (IMDUR) 30 MG 24 hr tablet Take 1 tablet (30 mg total) by mouth daily. 90 tablet 3    levothyroxine (SYNTHROID, LEVOTHROID) 200 MCG tablet Take 200 mcg by mouth daily before breakfast.      lisinopril (ZESTRIL) 30 MG tablet Take 1 tablet (30 mg total) by mouth in the morning and at bedtime. 180 tablet 3    meloxicam (MOBIC) 15 MG tablet Take 15 mg by mouth daily.      metoprolol tartrate (LOPRESSOR) 50 MG tablet TAKE 1 TABLET BY MOUTH TWICE A DAY 180 tablet 1    nitroGLYCERIN (NITROSTAT) 0.4 MG SL tablet Place 1 tablet (0.4 mg total) under the tongue every 5 (five) minutes as needed for chest pain. 25 tablet 2    potassium chloride SA (KLOR-CON M20) 20 MEQ tablet TAKE ONE TABLET ON TUESDAY, THURSDAY AND SATURDAY 36 tablet 3    traMADol (ULTRAM) 50 MG tablet  Take 50 mg by mouth as needed for moderate pain or severe pain.      Scheduled:   aspirin  324 mg Oral NOW   Or   aspirin  300 mg Rectal NOW   [START ON 09/17/2022] aspirin EC  81 mg Oral Daily   Infusions:   heparin      Assessment: Pt with a hx of CAD who presented to [facility] with CP. Heparin was started there around 2pm before transferring here for work up.  Hgb 16.8 Plt 132k Scr 1  Goal of Therapy:  Heparin level 0.3-0.7 units/ml Monitor platelets by anticoagulation protocol: Yes   Plan:  Heparin bolus 4000 units x1 Heparin infusion at 1150 units - at  1400 Check 8 hr HL Daily HL and CBC  Ulyses Southward, PharmD, BCIDP, AAHIVP, CPP Infectious Disease Pharmacist 09/16/2022 5:57 PM

## 2022-09-16 NOTE — Progress Notes (Signed)
ANTICOAGULATION CONSULT NOTE - Follow Up  Pharmacy Consult for heparin Indication: chest pain/ACS  Allergies  Allergen Reactions   Statins Other (See Comments)    Soreness with Crestor    Patient Measurements: Height: 5\' 8"  (172.7 cm) Weight: 78.7 kg (173 lb 8 oz) IBW/kg (Calculated) : 68.4 Heparin Dosing Weight: 79kg  Vital Signs: Temp: 98.3 F (36.8 C) (07/17 1950) Temp Source: Bladder (07/17 1950) BP: 146/103 (07/17 2158) Pulse Rate: 77 (07/17 2158)  Labs: Recent Labs    09/16/22 1808 09/16/22 1841 09/16/22 2137  HGB  --  15.3  --   HCT  --  45.9  --   PLT  --  125*  --   HEPARINUNFRC  --   --  0.61  CREATININE  --  1.10  --   TROPONINIHS 7,376*  --   --     Estimated Creatinine Clearance: 57 mL/min (by C-G formula based on SCr of 1.1 mg/dL).   Medical History: Past Medical History:  Diagnosis Date   CAD S/P percutaneous coronary angioplasty 1995, 2003   Inferior STEMI-PTCA RCA; 2003: Overlapping MultiLink Zeta BMS -RCA (4.0 x 23, 4.0 x 15); 09/2001 Cutting Balloon PTCA of stents for ISR.;; Myoview August 2013 - Mod-severe sized fixed INFERIOR perfusion defect => likely infarct with mild peri-infarct ischemia in the apical inferior wall, EF 55% with distal inferior hypokinesis   Essential hypertension    Hyperlipidemia LDL goal <70    Hypothyroidism    Low testosterone    Moderate cigarette smoker (10-19 per day)    Was down to 5 cigarettes a day.   ST elevation myocardial infarction (STEMI) of inferior wall, subsequent episode of care [facility]) 1995   PTCA of RCA; large inferior scar noted on Myoview. Mid inferior and inferoseptal hypokinesis on echocardiogram November 2014. EF 50-55%.   Stenosis of coronary stent 2012   Mid RCA Occlusion with Right-To-Left and Left-To-Right Collaterals.; Relatively normal LAD, ramus intermedius and circumflex-none.    Medications:  Medications Prior to Admission  Medication Sig Dispense Refill Last Dose   ALPRAZolam  (XANAX) 0.5 MG tablet Take 0.5 mg by mouth at bedtime.   09/15/2022   Cinnamon 500 MG TABS Take 2,000 mg by mouth 2 (two) times daily.   09/15/2022   clopidogrel (PLAVIX) 75 MG tablet Take 1 tablet (75 mg total) by mouth daily with breakfast. 90 tablet 2 09/15/2022   cyanocobalamin 1000 MCG tablet Take 1,000 mcg by mouth daily.   09/15/2022   Dulaglutide (TRULICITY) 0.75 MG/0.5ML SOPN Inject 0.75 mg into the skin once a week.   09/13/2022   levothyroxine (SYNTHROID, LEVOTHROID) 200 MCG tablet Take 200 mcg by mouth daily before breakfast.   09/15/2022   lisinopril (ZESTRIL) 30 MG tablet Take 1 tablet (30 mg total) by mouth in the morning and at bedtime. 180 tablet 3 09/15/2022   meloxicam (MOBIC) 15 MG tablet Take 15 mg by mouth daily.   09/15/2022   metoprolol tartrate (LOPRESSOR) 50 MG tablet TAKE 1 TABLET BY MOUTH TWICE A DAY (Patient taking differently: Take 50 mg by mouth 2 (two) times daily.) 180 tablet 1 09/15/2022 at 0800   nitroGLYCERIN (NITROSTAT) 0.4 MG SL tablet Place 1 tablet (0.4 mg total) under the tongue every 5 (five) minutes as needed for chest pain. 25 tablet 2 unknown   traMADol (ULTRAM) 50 MG tablet Take 50 mg by mouth every 6 (six) hours as needed for moderate pain or severe pain.   Past Week   Evolocumab (  REPATHA) 140 MG/ML SOSY Inject 140 mg into the skin every 14 (fourteen) days. 2 mL 3    furosemide (LASIX) 20 MG tablet TAKE ONE TABLET BY MOUTH TUESDAY, THURSDAY, AND SATURDAY (Patient not taking: Reported on 09/16/2022) 36 tablet 3 Not Taking   isosorbide mononitrate (IMDUR) 30 MG 24 hr tablet Take 1 tablet (30 mg total) by mouth daily. (Patient not taking: Reported on 09/16/2022) 90 tablet 3 Not Taking   potassium chloride SA (KLOR-CON M20) 20 MEQ tablet TAKE ONE TABLET ON TUESDAY, THURSDAY AND SATURDAY (Patient not taking: Reported on 09/16/2022) 36 tablet 3 Not Taking   Scheduled:   [START ON 09/17/2022] aspirin  81 mg Oral Pre-Cath   [START ON 09/17/2022] aspirin EC  81 mg Oral Daily    [START ON 09/17/2022] insulin aspart  0-6 Units Subcutaneous TID WC   isosorbide mononitrate  30 mg Oral Daily   [START ON 09/17/2022] levothyroxine  200 mcg Oral Q0600   lisinopril  30 mg Oral Daily   metoprolol tartrate  50 mg Oral BID   Infusions:   [START ON 09/17/2022] sodium chloride     Followed by   Melene Muller ON 09/17/2022] sodium chloride     heparin 1,150 Units/hr (09/16/22 1818)   magnesium sulfate bolus IVPB 2 g (09/16/22 2210)    Assessment: Pt with a hx of CAD who presented to [facility] with CP. Heparin was started there around 2pm before transferring here for work up.  Hgb 16.8 Plt 132k Scr 1  7/17 PM: Heparin level therapeutic at 0.61. No signs/symptoms of bleeding reported. Will continue same rate and recheck level in AM  Goal of Therapy:  Heparin level 0.3-0.7 units/ml Monitor platelets by anticoagulation protocol: Yes   Plan:  Continue heparin infusion at 1150 units - at 1400 Check 8 hr HL Daily HL and CBC  Arabella Merles, PharmD. Clinical Pharmacist 09/16/2022 10:31 PM

## 2022-09-16 NOTE — H&P (View-Only) (Signed)
Cardiology Office Note:    Date:  09/16/2022   ID:  [name] Dickerson, DOB [birth year], MRN [id number]  PCP:  Galvin Proffer, MD  Cardiologist:  Gypsy Balsam, MD    Referring MD: Galvin Proffer, MD   No chief complaint on file. The chest pain yesterday  History of Present Illness:    [name] Dickerson is a 77 y.o. male with very complex past medical history.  He was taken care of by Dr. Antony Contras medical history include advanced atherosclerosis with multiple intervention on his coronary arteries.  It look like first event was in 1995.  In 2003 when he got PTCA and stent to right coronary artery in view of STEMI at that time he received multiple overlapping stents.  Last evaluation of his coronary artery status was done in 2021 when he had a stress test done.  Stress test was consistent with prior myocardial infarction but no evidence of ischemia.  Ejection fraction was 45%.  Additional problems include dyslipidemia he is taking Repatha and Zetia, essential hypertension, smoking with sadly still ongoing I did see him last time in March which he was doing quite well but for last few days he started experiencing chest pain he had episode of chest pain last night at 1:00 in the morning he called EMS EMS came after him hospitalization he refused he walked into our office today.  EKG did not show any acute changes however he was sent for stat troponin I and it troponin is 16.7.  He was recommended to go to the emergency room to be admitted and evaluated.  Cardiac History: Inferior STEMI in 1995 -- PTCA of RCA 04/2001: Recurrent angina - overlapping BMS x 2 mRCA 09/2001: Cutting Balloon PTCA for ISR 2012: 100% mRCA occlusion, R-R bridging & L-R collaterals 2013: Myoview with Large Inferior "Infarct" w/ mild per-infarct ischemia 12/2012: Echo: EF 50-55%, distal Inferior HK  Past Medical History:  Diagnosis Date   CAD S/P percutaneous coronary angioplasty 1995, 2003   Inferior STEMI-PTCA RCA; 2003:  Overlapping MultiLink Zeta BMS -RCA (4.0 x 23, 4.0 x 15); 09/2001 Cutting Balloon PTCA of stents for ISR.;; Myoview August 2013 - Mod-severe sized fixed INFERIOR perfusion defect => likely infarct with mild peri-infarct ischemia in the apical inferior wall, EF 55% with distal inferior hypokinesis   Essential hypertension    Hyperlipidemia LDL goal <70    Hypothyroidism    Low testosterone    Moderate cigarette smoker (10-19 per day)    Was down to 5 cigarettes a day.   ST elevation myocardial infarction (STEMI) of inferior wall, subsequent episode of care [facility]) 1995   PTCA of RCA; large inferior scar noted on Myoview. Mid inferior and inferoseptal hypokinesis on echocardiogram November 2014. EF 50-55%.   Stenosis of coronary stent 2012   Mid RCA Occlusion with Right-To-Left and Left-To-Right Collaterals.; Relatively normal LAD, ramus intermedius and circumflex-none.    Past Surgical History:  Procedure Laterality Date   CARDIAC CATHETERIZATION  March 2012   Mid occlusion of RCA with Right to Left and Left to Right collaterals; LAD, ramus and circumflex/OM widely patent   CORONARY ANGIOPLASTY  1995   Inferior STEMI-PTCA of RCA   CORONARY STENT PLACEMENT  2003   MultiLink Zeta BMS 4.0 mm x 23 mm, 4.0 mm x 15 mm --> Cutting Balloon PTCA for ISR several months later.   LexiScan Myoview  August 2013   Moderate-severe sized INFERIOR perfusion defect with no evidence of ischemia. EF 55%  with distal inferior HK. Large sized Inferior and Inferolateral Infarct with perhaps mild apical inferior ischemia.   TRANSTHORACIC ECHOCARDIOGRAM  01/09/2013   EF 50-55%; normal LV size and function. Probable mild HJ of the mid inferior and inferoseptal wall.; No valvular lesions.    Current Medications: Current Meds  Medication Sig   isosorbide mononitrate (IMDUR) 30 MG 24 hr tablet Take 1 tablet (30 mg total) by mouth daily.     Allergies:   Statins   Social History   Socioeconomic History   Marital  status: Married    Spouse name: Not on file   Number of children: Not on file   Years of education: Not on file   Highest education level: Not on file  Occupational History   Not on file  Tobacco Use   Smoking status: Every Day    Current packs/day: 1.00    Average packs/day: 1 pack/day for 40.0 years (40.0 ttl pk-yrs)    Types: Cigarettes   Smokeless tobacco: Never  Substance and Sexual Activity   Alcohol use: No   Drug use: No   Sexual activity: Not on file  Other Topics Concern   Not on file  Social History Narrative   Married father of 3, grandfather 5. Works out occasionally on the treadmill. Coping to try get back into some exercise now. Unfortunately back up to smoking one pack per day. Does not drink alcohol.      Date from from 5 or 6 cigarettes a day. He's tried electronic cigarettes and down without affect.   Social Determinants of Health   Financial Resource Strain: Not on file  Food Insecurity: Not on file  Transportation Needs: Not on file  Physical Activity: Not on file  Stress: Not on file  Social Connections: Not on file     Family History: [name] family history is not on file. ROS:   Please see the history of present illness.    All 14 point review of systems negative except as described per history of present illness  EKGs/Labs/Other Studies Reviewed:         Recent Labs: 05/27/2022: ALT 18  Recent Lipid Panel    Component Value Date/Time   CHOL 160 05/27/2022 1458   TRIG 195 (H) 05/27/2022 1458   HDL 30 (L) 05/27/2022 1458   CHOLHDL 5.3 (H) 05/27/2022 1458   CHOLHDL 5.3 05/15/2010 1048   VLDL 49 (H) 05/15/2010 1048   LDLCALC 96 05/27/2022 1458   LDLDIRECT 121 (H) 07/04/2021 0933    Physical Exam:    VS:  There were no vitals taken for this visit.    Wt Readings from Last 3 Encounters:  05/27/22 182 lb (82.6 kg)  07/04/21 193 lb (87.5 kg)  07/02/20 190 lb 3.2 oz (86.3 kg)     GEN:  Well nourished, well developed in no  acute distress HEENT: Normal NECK: No JVD; No carotid bruits LYMPHATICS: No lymphadenopathy CARDIAC: RRR, no murmurs, no rubs, no gallops RESPIRATORY:  Clear to auscultation without rales, wheezing or rhonchi  ABDOMEN: Soft, non-tender, non-distended MUSCULOSKELETAL:  No edema; No deformity  SKIN: Warm and dry LOWER EXTREMITIES: no swelling NEUROLOGIC:  Alert and oriented x 3 PSYCHIATRIC:  Normal affect   ASSESSMENT:    1. CAD S/P percutaneous coronary angioplasty   2. Coronary artery disease, occlusive   3. Essential hypertension   4. Type 2 diabetes mellitus without complication, without long-term current use of insulin (HCC)   5. Cigarette smoker  PLAN:    In order of problems listed above:  Coronary disease in the months, troponin abnormal.  He will be referred to the emergency room for admission he will require CAD workup after appropriate therapy will be initiated.  He will require heparin. Essential hypertension blood pressure is elevated in the office.  Again he will be referred to emergency room for evaluation and proper treatment. Type 2 diabetes that being followed by antimedicine team. Smoking obviously huge problem, he was told to quit smoking.   Medication Adjustments/Labs and Tests Ordered: Current medicines are reviewed at length with the patient today.  Concerns regarding medicines are outlined above.  No orders of the defined types were placed in this encounter.  Medication changes:  Meds ordered this encounter  Medications   isosorbide mononitrate (IMDUR) 30 MG 24 hr tablet    Sig: Take 1 tablet (30 mg total) by mouth daily.    Dispense:  90 tablet    Refill:  3   nitroGLYCERIN (NITROSTAT) 0.4 MG SL tablet    Sig: Place 1 tablet (0.4 mg total) under the tongue every 5 (five) minutes as needed for chest pain.    Dispense:  25 tablet    Refill:  2    Signed, Georgeanna Lea, MD, [facility] 09/16/2022 12:05 PM    [facility]

## 2022-09-17 ENCOUNTER — Inpatient Hospital Stay (HOSPITAL_COMMUNITY): Payer: Medicare Other

## 2022-09-17 ENCOUNTER — Encounter (HOSPITAL_COMMUNITY)
Admission: RE | Disposition: A | Discharge status: Home / Self Care | Payer: Self-pay | Source: Other Acute Inpatient Hospital | Attending: Cardiology

## 2022-09-17 DIAGNOSIS — I2510 Atherosclerotic heart disease of native coronary artery without angina pectoris: Secondary | ICD-10-CM

## 2022-09-17 DIAGNOSIS — I214 Non-ST elevation (NSTEMI) myocardial infarction: Secondary | ICD-10-CM | POA: Diagnosis not present

## 2022-09-17 HISTORY — PX: LEFT HEART CATH AND CORONARY ANGIOGRAPHY: CATH118249

## 2022-09-17 LAB — LIPID PANEL
Cholesterol: 150 mg/dL (ref 0–200)
HDL: 33 mg/dL — ABNORMAL LOW (ref >40)
LDL Cholesterol: 83 mg/dL (ref 0–99)
Total CHOL/HDL Ratio: 4.5 RATIO
Triglycerides: 170 mg/dL — ABNORMAL HIGH (ref <150)
VLDL: 34 mg/dL (ref 0–40)

## 2022-09-17 LAB — CBC
HCT: 43.8 % (ref 39.0–52.0)
Hemoglobin: 14.5 g/dL (ref 13.0–17.0)
MCH: 30.5 pg (ref 26.0–34.0)
MCHC: 33.1 g/dL (ref 30.0–36.0)
MCV: 92.2 fL (ref 80.0–100.0)
Platelets: 130 10*3/uL — ABNORMAL LOW (ref 150–400)
RBC: 4.75 MIL/uL (ref 4.22–5.81)
RDW: 14.3 % (ref 11.5–15.5)
WBC: 5.8 10*3/uL (ref 4.0–10.5)
nRBC: 0 % (ref 0.0–0.2)

## 2022-09-17 LAB — BASIC METABOLIC PANEL
Anion gap: 12 (ref 5–15)
BUN: 19 mg/dL (ref 8–23)
CO2: 25 mmol/L (ref 22–32)
Calcium: 8.9 mg/dL (ref 8.9–10.3)
Chloride: 101 mmol/L (ref 98–111)
Creatinine, Ser: 1.14 mg/dL (ref 0.61–1.24)
GFR, Estimated: >60 mL/min (ref >60)
Glucose, Bld: 92 mg/dL (ref 70–99)
Potassium: 3.8 mmol/L (ref 3.5–5.1)
Sodium: 138 mmol/L (ref 135–145)

## 2022-09-17 LAB — GLUCOSE, CAPILLARY
Glucose-Capillary: 109 mg/dL — ABNORMAL HIGH (ref 70–99)
Glucose-Capillary: 183 mg/dL — ABNORMAL HIGH (ref 70–99)

## 2022-09-17 SURGERY — LEFT HEART CATH AND CORONARY ANGIOGRAPHY
Anesthesia: LOCAL

## 2022-09-17 MED ORDER — LISINOPRIL 30 MG PO TABS: 30.0000 mg | ORAL_TABLET | Freq: Every day | ORAL | Status: DC

## 2022-09-17 MED ORDER — ACETAMINOPHEN 325 MG PO TABS: 650.0000 mg | ORAL_TABLET | ORAL | Status: DC | PRN

## 2022-09-17 MED ORDER — LABETALOL HCL 5 MG/ML IV SOLN: 10.0000 mg | INTRAVENOUS | Status: AC | PRN

## 2022-09-17 MED ORDER — ONDANSETRON HCL 4 MG/2ML IJ SOLN: 4.0000 mg | Freq: Four times a day (QID) | INTRAMUSCULAR | Status: DC | PRN

## 2022-09-17 MED ORDER — SODIUM CHLORIDE 0.9% FLUSH: 3.0000 mL | INTRAVENOUS | Status: DC | PRN

## 2022-09-17 MED ORDER — MIDAZOLAM HCL 2 MG/2ML IJ SOLN
INTRAMUSCULAR | Status: DC | PRN
  Administered 2022-09-17: 1 mg via INTRAVENOUS

## 2022-09-17 MED ORDER — HEPARIN SODIUM (PORCINE) 1000 UNIT/ML IJ SOLN
INTRAMUSCULAR | Status: DC | PRN
  Administered 2022-09-17: 5000 [IU] via INTRAVENOUS

## 2022-09-17 MED ORDER — FENTANYL CITRATE (PF) 100 MCG/2ML IJ SOLN
INTRAMUSCULAR | Status: AC
  Filled 2022-09-17: qty 2

## 2022-09-17 MED ORDER — VERAPAMIL HCL 2.5 MG/ML IV SOLN
INTRAVENOUS | Status: DC | PRN
  Administered 2022-09-17: 5 mg via INTRA_ARTERIAL

## 2022-09-17 MED ORDER — ASPIRIN 81 MG PO TBEC: 81.0000 mg | DELAYED_RELEASE_TABLET | Freq: Every day | ORAL | 2 refills | Status: DC

## 2022-09-17 MED ORDER — FENTANYL CITRATE (PF) 100 MCG/2ML IJ SOLN
INTRAMUSCULAR | Status: DC | PRN
  Administered 2022-09-17: 25 ug via INTRAVENOUS

## 2022-09-17 MED ORDER — SODIUM CHLORIDE 0.9% FLUSH: 3.0000 mL | Freq: Two times a day (BID) | INTRAVENOUS | Status: DC

## 2022-09-17 MED ORDER — HEPARIN (PORCINE) IN NACL 1000-0.9 UT/500ML-% IV SOLN
INTRAVENOUS | Status: DC | PRN
  Administered 2022-09-17 (×2): 500 mL

## 2022-09-17 MED ORDER — SODIUM CHLORIDE 0.9 % IV SOLN
INTRAVENOUS | Status: AC | PRN
  Administered 2022-09-17: 250 mL via INTRAVENOUS

## 2022-09-17 MED ORDER — IOHEXOL 350 MG/ML SOLN
INTRAVENOUS | Status: DC | PRN
  Administered 2022-09-17: 38 mL

## 2022-09-17 MED ORDER — LIDOCAINE HCL (PF) 1 % IJ SOLN
INTRAMUSCULAR | Status: DC | PRN
  Administered 2022-09-17: 2 mL

## 2022-09-17 MED ORDER — VERAPAMIL HCL 2.5 MG/ML IV SOLN
INTRAVENOUS | Status: AC
  Filled 2022-09-17: qty 2

## 2022-09-17 MED ORDER — LIDOCAINE HCL (PF) 1 % IJ SOLN
INTRAMUSCULAR | Status: AC
  Filled 2022-09-17: qty 30

## 2022-09-17 MED ORDER — SODIUM CHLORIDE 0.9 % IV SOLN: 250.0000 mL | INTRAVENOUS | Status: DC | PRN

## 2022-09-17 MED ORDER — SODIUM CHLORIDE 0.9 % IV SOLN: INTRAVENOUS | Status: AC

## 2022-09-17 MED ORDER — HYDRALAZINE HCL 20 MG/ML IJ SOLN: 10.0000 mg | INTRAMUSCULAR | Status: AC | PRN

## 2022-09-17 MED ORDER — HEPARIN SODIUM (PORCINE) 1000 UNIT/ML IJ SOLN
INTRAMUSCULAR | Status: AC
  Filled 2022-09-17: qty 10

## 2022-09-17 MED ORDER — MIDAZOLAM HCL 2 MG/2ML IJ SOLN
INTRAMUSCULAR | Status: AC
  Filled 2022-09-17: qty 2

## 2022-09-17 SURGICAL SUPPLY — 14 items
CATH INFINITI 5FR ANG PIGTAIL (CATHETERS) IMPLANT
CATH OPTITORQUE TIG 4.0 6F (CATHETERS) IMPLANT
DEVICE RAD COMP TR BAND LRG (VASCULAR PRODUCTS) IMPLANT
GLIDESHEATH SLEND SS 6F .021 (SHEATH) IMPLANT
KIT HEART LEFT (KITS) ×1 IMPLANT
KIT SINGLE USE MANIFOLD (KITS) IMPLANT
KIT SYRINGE INJ CVI SPIKEX1 (MISCELLANEOUS) IMPLANT
PACK CARDIAC CATHETERIZATION (CUSTOM PROCEDURE TRAY) ×1 IMPLANT
SET ATX-X65L (MISCELLANEOUS) IMPLANT
SYR MEDRAD MARK 7 150ML (SYRINGE) ×1 IMPLANT
TRANSDUCER W/STOPCOCK (MISCELLANEOUS) ×1 IMPLANT
TUBING CIL FLEX 10 FLL-RA (TUBING) ×1 IMPLANT
WIRE EMERALD 3MM-J .035X150CM (WIRE) IMPLANT
WIRE EMERALD 3MM-J .035X260CM (WIRE) IMPLANT

## 2022-09-17 NOTE — Progress Notes (Signed)
Discharged to home, AVS reviewed, belongings returned from security.

## 2022-09-17 NOTE — Discharge Summary (Signed)
Discharge Summary    Patient ID: [name] Dickerson MRN: [id number]; DOB: [birth year]  Admit date: 09/16/2022 Discharge date: 09/17/2022  PCP:  Galvin Proffer, MD   [facility] Providers Cardiologist:  Bryan Lemma, MD     Discharge Diagnoses    Principal Problem:   NSTEMI (non-ST elevated myocardial infarction) [facility]) Active Problems:   Essential hypertension   Hyperlipidemia, unspecified   Cigarette smoker   Type 2 diabetes mellitus without complication, without long-term current use of insulin (HCC)   Hyperlipidemia LDL goal <70  Diagnostic Studies/Procedures    Cath: 09/17/2022    Mid LAD lesion is 40% stenosed.   Dist LM lesion is 20% stenosed.   Ramus lesion is 30% stenosed.   Ost RCA to Prox RCA lesion is 100% stenosed.   1.  Chronic total occlusion of proximal right coronary artery with mild to moderate diffuse disease elsewhere. 2.  LVEDP of 21 mmHg.   Recommendation: The results were reviewed with the team C attending Dr. Swazil[place] and medical therapy will be pursued.  Diagnostic Dominance: Right  _____________   History of Present Illness     [name] Dickerson is a 77 y.o. male with PMH of CAD with multiple PCIs,  HTN, HLD, type 2 DM, who was seen 09/16/2022 for the evaluation of NSTEMI.   Mr. [name] Dickerson with above PMH presented to Dr [name] Dickerson office for chest pain. He stated he had 3 episodes of chest pain over the past 6 days prior to admission. Pain was located at mid-sternum area, radiating to his back, occurred at night before he when to bed. The first 2 episodes of chest pain lasted 15-20 minutes and he felt it was heart burn. The night had had much severe resting chest pain, he drank some aspirin powder and had emesis x1, pain subsequently resolved after 2 hours. He called EMS and was advised go to ER, but he did not go. He saw Dr [name] Dickerson the day of admission, and was sent to [facility] for ACS check up. Trop was noted elevated. He was therefore send to  [facility] for heart cath. He was chest pain free on arrival to [name]. He denied  any SOB, dizziness, syncope, leg edema. He smokes 1.5 PPD. He denied ETOH or illicit drug use. He states his diabetes is generally controlled well, on a weekly injection. He states he is active and able to whatever he wishes, mobility sometimes limited by his hips pain, but able to walk up a flight of stairs or do yard work if he wants to at baseline.    At [facility], his labs work showed WBC 7500, Hgb 16.8, PLT 132k, INR 1. Na 137, K 4.4, BUN 16, Cr 1, GFR >60, AST elevated 98, ALT WNL 33. Pro BNP 721. Albumin 4.8 Trop 15.5 x1. CXR showed no acute findings. EKG showed sinus rhythm, PVCs, TWI of lead I and AVLseems to be new compairng to 2023 EKG.    Per chart review, he used to see Dr Herbie Baltimore, then transitioned to Dr Servando Salina since 2021 and then Dr [name] Dickerson since 2023. He suffered initial inferior STEMI in 1995 , underwent PTCA of RCA; due to recurrent angina 04/2001, he was treated with overlapping BMS x 2 mRCA; 09/2001 he was treated with cutting balloon PTCA for ISR; in 2012, he was found to have 100% mRCA occlusion, R-R bridging and L-R collaterals; in 2013 he underwent stress myoview showing large large inferolateral infarct but no ischemia; in 10/10/2019  he underwent stress myoveiw again showing EF 45%, consistent with prior MI, intermediate risk study.    Last heart cath 05/16/2010 per report showed terminal eccentric 40% narrowing of the left main, Cx and ramus free of disease, proximal LAD was normal, distal 2/3rd was relatively small and there was a small diagonal branch. There was a stent in the midportion of the RCA that was chronically occluded.  There was some slow filling of the distal right via skipped collaterals around the stent and there was an exceptionally large collateral bed from both the circumflex and LAD, filling the RCA system.   He was maintained on Plavix 75mg  daily. He is statin intolerant with muscle  ache, historically on repatha and zetia, stopped taking repartha a few times throughout the years. His most recent Echo was done 07/10/21 which showed LVEF 60-65%, no RWMA, mild LVH, grade I DD, normal RV.   Hospital Course     NSTEMI CAD with hx of multiple PCIs  -- patient presented with 6 days of intermittent resting chest pain, worsened last night, resolved with ASA and emesis. EKG with non-specific TWI of lateral leads seems new today. Troponin I elevated at 15.5.  -- underwent cardiac cath noted above with CTO of RCA with collaterals. Recommendations for medical therapy -- Add ASA 81mg  daily, continue  Plavix 75mg  daily, Imdur 30mg  daily, lisinopril 30mg  daily, metoprolol 50mg  BID, and zetia 10mg  daily, and PRN nitroglycerin -- will need outpatient echo at follow up  Type 2 DM  -- Hgb A1c pending at DC -- resumed on home regimen   HTN -- stable, continue lisinopril and imdur and metoprolol    HLD -- lipids pending at DC -- will resume Zetia, intolerant to statin, on Repatha    Tobacco use -- recommended smoking cessation   Hypothyroidism -- TSH 0.582 -- continue levothyroxine    Anxiety -- on nightly xanax  Leg/hip pain Claudication  -- will need outpatient LE arterial dopplers   Patient was seen by Dr. Swazil[place] and deemed stable for discharge home. Follow up arranged in the office.  Did the patient have an acute coronary syndrome (MI, NSTEMI, STEMI, etc) this admission?:  Yes                               AHA/ACC Clinical Performance & Quality Measures: Aspirin prescribed? - Yes ADP Receptor Inhibitor (Plavix/Clopidogrel, Brilinta/Ticagrelor or Effient/Prasugrel) prescribed (includes medically managed patients)? - Yes Beta Blocker prescribed? - Yes High Intensity Statin (Lipitor 40-80mg  or Crestor 20-40mg ) prescribed? - No - intolerant EF assessed during THIS hospitalization? - No - will need outpatient echo For EF <40%, was ACEI/ARB prescribed? - Not Applicable (EF  >/= 40%) For EF <40%, Aldosterone Antagonist (Spironolactone or Eplerenone) prescribed? - Not Applicable (EF >/= 40%) Cardiac Rehab Phase II ordered (including medically managed patients)? - Yes   The patient will be scheduled for a TOC follow up appointment in 10-14 days.  A message has been sent to the [facility] and Scheduling Pool at the office where the patient should be seen for follow up.  _____________  Discharge Vitals Blood pressure (!) 104/54, pulse 66, temperature 97.8 F (36.6 C), temperature source Oral, resp. rate 16, height 5\' 8"  (1.727 m), weight 78.9 kg, SpO2 97%.  Filed Weights   09/16/22 1700 09/17/22 0555  Weight: 78.7 kg 78.9 kg    Labs & Radiologic Studies    CBC Recent Labs  09/16/22 1841  WBC 6.1  NEUTROABS 3.8  HGB 15.3  HCT 45.9  MCV 92.2  PLT 125*   Basic Metabolic Panel Recent Labs    56/21/30 1841  NA 136  K 3.9  CL 99  CO2 25  GLUCOSE 109*  BUN 13  CREATININE 1.10  CALCIUM 9.2  MG 1.6*   Liver Function Tests Recent Labs    09/16/22 1841  AST 54*  ALT 25  ALKPHOS 52  BILITOT 0.7  PROT 6.6  ALBUMIN 3.9   No results for input(s): "LIPASE", "AMYLASE" in the last 72 hours. High Sensitivity Troponin:   Recent Labs  Lab 09/16/22 1808 09/16/22 2137  TROPONINIHS 7,376* 7,538*    BNP Invalid input(s): "POCBNP" D-Dimer No results for input(s): "DDIMER" in the last 72 hours. Hemoglobin A1C No results for input(s): "HGBA1C" in the last 72 hours. Fasting Lipid Panel No results for input(s): "CHOL", "HDL", "LDLCALC", "TRIG", "CHOLHDL", "LDLDIRECT" in the last 72 hours. Thyroid Function Tests Recent Labs    09/16/22 1841  TSH 0.583   _____________  CARDIAC CATHETERIZATION  Result Date: 09/17/2022   Mid LAD lesion is 40% stenosed.   Dist LM lesion is 20% stenosed.   Ramus lesion is 30% stenosed.   Ost RCA to Prox RCA lesion is 100% stenosed. 1.  Chronic total occlusion of proximal right coronary artery with mild to moderate  diffuse disease elsewhere. 2.  LVEDP of 21 mmHg. Recommendation: The results were reviewed with the team C attending Dr. Swazil[place] and medical therapy will be pursued.   Disposition   Pt is being discharged home today in good condition.  Follow-up Plans & Appointments     Follow-up Information     Georgeanna Lea, MD Follow up on 10/29/2022.   Specialty: Cardiology Why: at 2pm for your follow up appt with cardiology Contact information: [address] [place] [address] [phone]                Discharge Instructions     Amb Referral to Cardiac Rehabilitation   Complete by: As directed    Diagnosis: NSTEMI   After initial evaluation and assessments completed: Virtual Based Care may be provided alone or in conjunction with Phase 2 Cardiac Rehab based on patient barriers.: Yes   Intensive Cardiac Rehabilitation (ICR) [facility] location only OR Traditional Cardiac Rehabilitation (TCR) *If criteria for ICR are not met will enroll in TCR [facility] only): Yes   Ambulatory Referral for Lung Cancer Scre   Complete by: As directed    Diet - low sodium heart healthy   Complete by: As directed    Increase activity slowly   Complete by: As directed        Discharge Medications   Allergies as of 09/17/2022       Reactions   Statins Other (See Comments)   Soreness with Crestor        Medication List     STOP taking these medications    furosemide 20 MG tablet Commonly known as: LASIX   potassium chloride SA 20 MEQ tablet Commonly known as: Klor-Con M20       TAKE these medications    ALPRAZolam 0.5 MG tablet Commonly known as: XANAX Take 0.5 mg by mouth at bedtime.   aspirin EC 81 MG tablet Take 1 tablet (81 mg total) by mouth daily. Swallow whole. Start taking on: September 18, 2022   Cinnamon 500 MG Tabs Take 2,000 mg by mouth 2 (two) times daily.  clopidogrel 75 MG tablet Commonly known as: PLAVIX Take 1 tablet (75 mg total) by mouth daily with breakfast.    cyanocobalamin 1000 MCG tablet Take 1,000 mcg by mouth daily.   isosorbide mononitrate 30 MG 24 hr tablet Commonly known as: IMDUR Take 1 tablet (30 mg total) by mouth daily.   levothyroxine 200 MCG tablet Commonly known as: SYNTHROID Take 200 mcg by mouth daily before breakfast.   lisinopril 30 MG tablet Commonly known as: ZESTRIL Take 1 tablet (30 mg total) by mouth daily. What changed: when to take this   meloxicam 15 MG tablet Commonly known as: MOBIC Take 15 mg by mouth daily.   metoprolol tartrate 50 MG tablet Commonly known as: LOPRESSOR TAKE 1 TABLET BY MOUTH TWICE A DAY   nitroGLYCERIN 0.4 MG SL tablet Commonly known as: NITROSTAT Place 1 tablet (0.4 mg total) under the tongue every 5 (five) minutes as needed for chest pain.   Repatha 140 MG/ML Sosy Generic drug: Evolocumab Inject 140 mg into the skin every 14 (fourteen) days.   traMADol 50 MG tablet Commonly known as: ULTRAM Take 50 mg by mouth every 6 (six) hours as needed for moderate pain or severe pain.   Trulicity 0.75 MG/0.5ML Sopn Generic drug: Dulaglutide Inject 0.75 mg into the skin once a week.         Outstanding Labs/Studies   Outpt echo/LE dopplers  Duration of Discharge Encounter   Greater than 30 minutes including physician time.  Signed, Laverda Page, NP 09/17/2022, 2:33 PM

## 2022-09-17 NOTE — Interval H&P Note (Signed)
History and Physical Interval Note:  09/17/2022 8:41 AM  [name] Dickerson  has presented today for surgery, with the diagnosis of ACS.  The various methods of treatment have been discussed with the patient and family. After consideration of risks, benefits and other options for treatment, the patient has consented to  Procedure(s): LEFT HEART CATH AND CORONARY ANGIOGRAPHY (N/A) as a surgical intervention.  [name] history has been reviewed, patient examined, no change in status, stable for surgery.  I have reviewed the patient's chart and labs.  Questions were answered to the patient's satisfaction.     [name]

## 2022-09-17 NOTE — Progress Notes (Signed)
CARDIAC REHAB PHASE I    Pt resting in bed, feeling well. Reports ambulating in room several times with no CP, SOB or dizziness. Post MI education including site care, restrictions, risk factors, heart healthy diabetic diet, NTG use, MI booklet, exercise guidelines, smoking cessation and CRP2 reviewed. All questions and concerns addressed. Will refer to [facility] for CRP2. Plan for home later today.   [id number]  Woodroe Chen, RN BSN 09/17/2022 1:42 PM

## 2022-09-17 NOTE — Progress Notes (Signed)
Critical troponin of 7376. [place] MD notified. No new orders at this time.

## 2022-09-17 NOTE — Progress Notes (Signed)
Belongings sent to security. Please retrieve them at discharge. Belongings receipt in chart.

## 2022-09-17 NOTE — Care Management CC44 (Signed)
Condition Code 44 Documentation Completed  Patient Details  Name: DAYN BARICH MRN: [id number] Date of Birth: 12-23-1945   Condition Code 44 given:  Yes Patient signature on Condition Code 44 notice:  Yes Documentation of 2 MD's agreement:  Yes Code 44 added to claim:  Yes    Durenda Guthrie, RN 09/17/2022, 2:49 PM

## 2022-09-17 NOTE — Care Management Obs Status (Signed)
MEDICARE OBSERVATION STATUS NOTIFICATION   Patient Details  Name: [name] Dickerson MRN: [id number] Date of Birth: Jul 05, 1945   Medicare Observation Status Notification Given:  Yes    Durenda Guthrie, RN 09/17/2022, 2:48 PM

## 2022-09-17 NOTE — Care Management Obs Status (Signed)
MEDICARE OBSERVATION STATUS NOTIFICATION   Patient Details  Name: FELTON BUCZYNSKI MRN: [id number] Date of Birth: Jul 05, 1945   Medicare Observation Status Notification Given:  Yes    Durenda Guthrie, RN 09/17/2022, 2:48 PM

## 2022-09-18 LAB — HEMOGLOBIN A1C
Hgb A1c MFr Bld: 6.2 % — ABNORMAL HIGH (ref 4.8–5.6)
Mean Plasma Glucose: 131 mg/dL

## 2022-09-18 LAB — LIPOPROTEIN A (LPA): Lipoprotein (a): 13.4 nmol/L (ref <75.0)

## 2022-09-21 ENCOUNTER — Telehealth (HOSPITAL_COMMUNITY): Payer: Self-pay

## 2022-09-21 ENCOUNTER — Encounter (HOSPITAL_COMMUNITY): Payer: Self-pay | Admitting: Internal Medicine

## 2022-09-21 NOTE — Telephone Encounter (Signed)
Per phase 1 Cardiac Rehab fax referral to [facility]

## 2022-10-29 ENCOUNTER — Ambulatory Visit: Payer: Medicare Other | Admitting: Cardiology

## 2022-10-29 DIAGNOSIS — M533 Sacrococcygeal disorders, not elsewhere classified: Secondary | ICD-10-CM

## 2022-10-29 DIAGNOSIS — M5416 Radiculopathy, lumbar region: Secondary | ICD-10-CM | POA: Insufficient documentation

## 2022-10-29 HISTORY — DX: Radiculopathy, lumbar region: M54.16

## 2022-10-29 HISTORY — DX: Sacrococcygeal disorders, not elsewhere classified: M53.3

## 2022-11-05 ENCOUNTER — Encounter: Payer: Self-pay | Admitting: Cardiology

## 2022-11-20 ENCOUNTER — Telehealth: Payer: Self-pay

## 2022-11-20 NOTE — Telephone Encounter (Signed)
Name: [name] Dickerson  DOB: 1945-06-02  MRN: [id number]  Primary Cardiologist: Gypsy Balsam, MD  Chart reviewed as part of pre-operative protocol coverage. Because of [name] Dickerson's past medical history and time since last visit, [name] Dickerson will require a follow-up in-office visit in order to better assess preoperative cardiovascular risk.  Patient has an office visit scheduled with Dr. Bing Matter on 12/22/2022. Appointment notes have been updated to reflect need for pre-op evaluation.   Pre-op covering staff:  - Please contact requesting surgeon's office via preferred method (i.e, phone, fax) to inform them of need for appointment prior to surgery.   Carlos Levering, NP  11/20/2022, 1:16 PM

## 2022-11-20 NOTE — Telephone Encounter (Signed)
Scheduled sooner with JCW/ Monday 09/23. Patient is agreeable and has no questions!  Thank you

## 2022-11-20 NOTE — Telephone Encounter (Signed)
...  Pre-operative Risk Assessment    Patient Name: [name] Dickerson  DOB: 1945/05/05 MRN: [id number]      Request for Surgical Clearance    Procedure:   B/L SI JOINT INJECTIONS  Date of Surgery:  Clearance TBD                             DR Wynonia Lawman    Surgeon:  DR Wynonia Lawman Surgeon's Group or Practice Name:  [facility] Phone number:  [phone] Fax number:  [phone]   Type of Clearance Requested:   - Medical  - Pharmacy:  Hold Aspirin     Type of Anesthesia:  Not Indicated   Additional requests/questions:   LAST OV 09/16/22,, NEXT O/V 12/22/22  Signed, Renee Ramus   11/20/2022, 12:51 PM

## 2022-11-22 NOTE — Progress Notes (Unsigned)
Cardiology Office Note:  .   Date:  11/23/2022  ID:  [name] Dickerson, DOB [birth year], MRN [id number] PCP: Galvin Proffer, MD  [name] Providers Cardiologist:  Gypsy Balsam, MD    History of Present Illness: .   [name] Dickerson is a 77 y.o. male with a past medical history of hypertension, CAD, DM2, hypothyroidism, tobacco abuse, dyslipidemia.  1995 STEMI PTCA of RCA 2003 PCI with overlapping BMS x 2 mid RCA 2003 Cutting Balloon PTCA for ISR 20 1200% mRCA occlusion with right to right bridging and left-to-right collaterals 2013 stress Myoview showing large inferolateral infarct with no ischemia 2021 stress Myoview consistent with prior MI 09/17/2022 left heart cath CTO of proximal RCA with mild to moderate diffuse disease elsewhere recommendations for medical management  Admitted 09/16/2022 for non-STEMI underwent left heart cath on 09/18/2022 which revealed CTO of proximal RCA with mild to moderate diffuse disease elsewhere, recommendations for medical therapy.  He presents today for follow-up after his recent left heart catheterization and also for preoperative cardiovascular evaluation.  He states he is doing well from a cardiac perspective, denies any episodes of chest pain, palpitations, shortness of breath, dizziness, edema.  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  He is most bothered by ongoing back pain, would like to get injections for this however needed preoperative evaluation prior to.  Is somewhat unclear about the medications he has been to be taking however he is no longer taking Repatha.  It appears he was told to start Zetia back after his most recent hospitalization, again he is somewhat unclear with the medications he is currently on.  ROS: Review of Systems  All other systems reviewed and are negative.    Studies Reviewed: Marland Kitchen   EKG Interpretation Date/Time:  Monday November 23 2022  15:51:15 EDT Ventricular Rate:  78 PR Interval:  212 QRS Duration:  100 QT Interval:  402 QTC Calculation: 458 R Axis:   68  Text Interpretation: Sinus rhythm with 1st degree A-V block with occasional Premature ventricular complexes Incomplete right bundle branch block Borderline ECG When compared with ECG of 16-Sep-2022 10:13, Premature ventricular complexes are now Present Criteria for Septal infarct are no longer Present Criteria for Lateral infarct are no longer Present Nonspecific T wave abnormality no longer evident in Lateral leads Confirmed by Wallis Bamberg [phone]) on 11/23/2022 6:50:59 PM    Cardiac Studies & Procedures   CARDIAC CATHETERIZATION  CARDIAC CATHETERIZATION 09/17/2022  Narrative   Mid LAD lesion is 40% stenosed.   Dist LM lesion is 20% stenosed.   Ramus lesion is 30% stenosed.   Ost RCA to Prox RCA lesion is 100% stenosed.  1.  Chronic total occlusion of proximal right coronary artery with mild to moderate diffuse disease elsewhere. 2.  LVEDP of 21 mmHg.  Recommendation: The results were reviewed with the team C attending Dr. [place] and medical therapy will be pursued.  Findings Coronary Findings Diagnostic  Dominance: Right  Left Main Dist LM lesion is 20% stenosed.  Left Anterior Descending There is mild diffuse disease throughout the vessel. Mid LAD lesion is 40% stenosed.  Ramus Intermedius Ramus lesion is 30% stenosed.  Right Coronary Artery Ost RCA to Prox RCA lesion is 100% stenosed. The lesion was previously treated .  Third Right Posterolateral Branch Collaterals 3rd RPL filled by collaterals from 3rd Mrg.  Collaterals 3rd RPL filled by collaterals  Cardiology Office Note:  .   Date:  11/23/2022  ID:  [name] Dickerson, DOB [birth year], MRN [id number] PCP: Galvin Proffer, MD  [name] Providers Cardiologist:  Gypsy Balsam, MD    History of Present Illness: .   [name] Dickerson is a 77 y.o. male with a past medical history of hypertension, CAD, DM2, hypothyroidism, tobacco abuse, dyslipidemia.  1995 STEMI PTCA of RCA 2003 PCI with overlapping BMS x 2 mid RCA 2003 Cutting Balloon PTCA for ISR 20 1200% mRCA occlusion with right to right bridging and left-to-right collaterals 2013 stress Myoview showing large inferolateral infarct with no ischemia 2021 stress Myoview consistent with prior MI 09/17/2022 left heart cath CTO of proximal RCA with mild to moderate diffuse disease elsewhere recommendations for medical management  Admitted 09/16/2022 for non-STEMI underwent left heart cath on 09/18/2022 which revealed CTO of proximal RCA with mild to moderate diffuse disease elsewhere, recommendations for medical therapy.  He presents today for follow-up after his recent left heart catheterization and also for preoperative cardiovascular evaluation.  He states he is doing well from a cardiac perspective, denies any episodes of chest pain, palpitations, shortness of breath, dizziness, edema.  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  He is most bothered by ongoing back pain, would like to get injections for this however needed preoperative evaluation prior to.  Is somewhat unclear about the medications he has been to be taking however he is no longer taking Repatha.  It appears he was told to start Zetia back after his most recent hospitalization, again he is somewhat unclear with the medications he is currently on.  ROS: Review of Systems  All other systems reviewed and are negative.    Studies Reviewed: Marland Kitchen   EKG Interpretation Date/Time:  Monday November 23 2022  15:51:15 EDT Ventricular Rate:  78 PR Interval:  212 QRS Duration:  100 QT Interval:  402 QTC Calculation: 458 R Axis:   68  Text Interpretation: Sinus rhythm with 1st degree A-V block with occasional Premature ventricular complexes Incomplete right bundle branch block Borderline ECG When compared with ECG of 16-Sep-2022 10:13, Premature ventricular complexes are now Present Criteria for Septal infarct are no longer Present Criteria for Lateral infarct are no longer Present Nonspecific T wave abnormality no longer evident in Lateral leads Confirmed by Wallis Bamberg [phone]) on 11/23/2022 6:50:59 PM    Cardiac Studies & Procedures   CARDIAC CATHETERIZATION  CARDIAC CATHETERIZATION 09/17/2022  Narrative   Mid LAD lesion is 40% stenosed.   Dist LM lesion is 20% stenosed.   Ramus lesion is 30% stenosed.   Ost RCA to Prox RCA lesion is 100% stenosed.  1.  Chronic total occlusion of proximal right coronary artery with mild to moderate diffuse disease elsewhere. 2.  LVEDP of 21 mmHg.  Recommendation: The results were reviewed with the team C attending Dr. [place] and medical therapy will be pursued.  Findings Coronary Findings Diagnostic  Dominance: Right  Left Main Dist LM lesion is 20% stenosed.  Left Anterior Descending There is mild diffuse disease throughout the vessel. Mid LAD lesion is 40% stenosed.  Ramus Intermedius Ramus lesion is 30% stenosed.  Right Coronary Artery Ost RCA to Prox RCA lesion is 100% stenosed. The lesion was previously treated .  Third Right Posterolateral Branch Collaterals 3rd RPL filled by collaterals from 3rd Mrg.  Collaterals 3rd RPL filled by collaterals  Cardiology Office Note:  .   Date:  11/23/2022  ID:  [name] Dickerson, DOB [birth year], MRN [id number] PCP: Galvin Proffer, MD  [name] Providers Cardiologist:  Gypsy Balsam, MD    History of Present Illness: .   [name] Dickerson is a 77 y.o. male with a past medical history of hypertension, CAD, DM2, hypothyroidism, tobacco abuse, dyslipidemia.  1995 STEMI PTCA of RCA 2003 PCI with overlapping BMS x 2 mid RCA 2003 Cutting Balloon PTCA for ISR 20 1200% mRCA occlusion with right to right bridging and left-to-right collaterals 2013 stress Myoview showing large inferolateral infarct with no ischemia 2021 stress Myoview consistent with prior MI 09/17/2022 left heart cath CTO of proximal RCA with mild to moderate diffuse disease elsewhere recommendations for medical management  Admitted 09/16/2022 for non-STEMI underwent left heart cath on 09/18/2022 which revealed CTO of proximal RCA with mild to moderate diffuse disease elsewhere, recommendations for medical therapy.  He presents today for follow-up after his recent left heart catheterization and also for preoperative cardiovascular evaluation.  He states he is doing well from a cardiac perspective, denies any episodes of chest pain, palpitations, shortness of breath, dizziness, edema.  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  He is most bothered by ongoing back pain, would like to get injections for this however needed preoperative evaluation prior to.  Is somewhat unclear about the medications he has been to be taking however he is no longer taking Repatha.  It appears he was told to start Zetia back after his most recent hospitalization, again he is somewhat unclear with the medications he is currently on.  ROS: Review of Systems  All other systems reviewed and are negative.    Studies Reviewed: Marland Kitchen   EKG Interpretation Date/Time:  Monday November 23 2022  15:51:15 EDT Ventricular Rate:  78 PR Interval:  212 QRS Duration:  100 QT Interval:  402 QTC Calculation: 458 R Axis:   68  Text Interpretation: Sinus rhythm with 1st degree A-V block with occasional Premature ventricular complexes Incomplete right bundle branch block Borderline ECG When compared with ECG of 16-Sep-2022 10:13, Premature ventricular complexes are now Present Criteria for Septal infarct are no longer Present Criteria for Lateral infarct are no longer Present Nonspecific T wave abnormality no longer evident in Lateral leads Confirmed by Wallis Bamberg [phone]) on 11/23/2022 6:50:59 PM    Cardiac Studies & Procedures   CARDIAC CATHETERIZATION  CARDIAC CATHETERIZATION 09/17/2022  Narrative   Mid LAD lesion is 40% stenosed.   Dist LM lesion is 20% stenosed.   Ramus lesion is 30% stenosed.   Ost RCA to Prox RCA lesion is 100% stenosed.  1.  Chronic total occlusion of proximal right coronary artery with mild to moderate diffuse disease elsewhere. 2.  LVEDP of 21 mmHg.  Recommendation: The results were reviewed with the team C attending Dr. [place] and medical therapy will be pursued.  Findings Coronary Findings Diagnostic  Dominance: Right  Left Main Dist LM lesion is 20% stenosed.  Left Anterior Descending There is mild diffuse disease throughout the vessel. Mid LAD lesion is 40% stenosed.  Ramus Intermedius Ramus lesion is 30% stenosed.  Right Coronary Artery Ost RCA to Prox RCA lesion is 100% stenosed. The lesion was previously treated .  Third Right Posterolateral Branch Collaterals 3rd RPL filled by collaterals from 3rd Mrg.  Collaterals 3rd RPL filled by collaterals  from 3rd Mrg.  Collaterals 3rd RPL filled by collaterals from 3rd Mrg.  Intervention  No interventions have been documented.   STRESS TESTS  MYOCARDIAL PERFUSION IMAGING 10/10/2019  Narrative  The left ventricular ejection fraction is mildly decreased (45-54%).  Nuclear stress  EF: 45%.  There was no ST segment deviation noted during stress.  Defect 1: There is a medium defect of moderate severity present in the basal inferior, basal inferolateral, mid inferior, mid inferolateral and apex location.  Findings consistent with prior myocardial infarction.  This is an intermediate risk study.   ECHOCARDIOGRAM  ECHOCARDIOGRAM COMPLETE 07/10/2021  Narrative ECHOCARDIOGRAM REPORT    Patient Name:   EIVAN STEPPER [facility] Date of Exam: 07/10/2021 Medical Rec #:  [id number]    Height:       68.0 in Accession #:    [id number]   Weight:       193.0 lb Date of Birth:  1945-04-09   BSA:          2.013 m Patient Age:    75 years     BP:           112/70 mmHg Patient Gender: M            HR:           74 bpm. Exam Location:  [place]  Procedure: 2D Echo, Cardiac Doppler, Color Doppler and Strain Analysis  Indications:    Coronary artery disease, occlusive [I25.10 (ICD-10-CM)]; Essential hypertension [I10 (ICD-10-CM)]; Type 2 diabetes mellitus without complication, without long-term current use of insulin (HCC) [E11.9 (ICD-10-CM)]; Moderate cigarette smoker (10-19 per day) [F17.210 (ICD-10-CM)]  History:        Patient has prior history of Echocardiogram examinations, most recent 01/09/2013. CAD and Previous Myocardial Infarction; Risk Factors:Hypertension and Dyslipidemia.  Sonographer:    Margreta Journey RDCS Referring Phys: [id number] ROBERT J KRASOWSKI  IMPRESSIONS   1. Left ventricular ejection fraction, by estimation, is 60 to 65%. The left ventricle has normal function. The left ventricle has no regional wall motion abnormalities. There is mild left ventricular hypertrophy. Left ventricular diastolic parameters are consistent with Grade I diastolic dysfunction (impaired relaxation). 2. Right ventricular systolic function is normal. The right ventricular size is normal. 3. The mitral valve is normal in structure. No evidence of mitral valve regurgitation. No evidence  of mitral stenosis. 4. The aortic valve is normal in structure. Aortic valve regurgitation is not visualized. No aortic stenosis is present. 5. The inferior vena cava is normal in size with greater than 50% respiratory variability, suggesting right atrial pressure of 3 mmHg.  FINDINGS Left Ventricle: Left ventricular ejection fraction, by estimation, is 60 to 65%. The left ventricle has normal function. The left ventricle has no regional wall motion abnormalities. The left ventricular internal cavity size was normal in size. There is mild left ventricular hypertrophy. Left ventricular diastolic parameters are consistent with Grade I diastolic dysfunction (impaired relaxation).  Right Ventricle: The right ventricular size is normal. No increase in right ventricular wall thickness. Right ventricular systolic function is normal.  Left Atrium: Left atrial size was normal in size.  Right Atrium: Right atrial size was normal in size.  Pericardium: There is no evidence of pericardial effusion.  Mitral Valve: The mitral valve is normal in structure. No evidence of mitral valve regurgitation. No evidence of mitral valve stenosis.  Tricuspid Valve: The tricuspid valve is normal in structure. Tricuspid valve regurgitation is not demonstrated. No evidence of tricuspid stenosis.  Aortic Valve: The

## 2022-11-23 ENCOUNTER — Encounter: Payer: Self-pay | Admitting: Cardiology

## 2022-11-23 ENCOUNTER — Ambulatory Visit: Payer: Medicare Other | Attending: Cardiology | Admitting: Cardiology

## 2022-11-23 VITALS — BP 131/74 | HR 78 | Ht 68.0 in | Wt 185.0 lb

## 2022-11-23 DIAGNOSIS — E785 Hyperlipidemia, unspecified: Secondary | ICD-10-CM

## 2022-11-23 DIAGNOSIS — I10 Essential (primary) hypertension: Secondary | ICD-10-CM | POA: Diagnosis not present

## 2022-11-23 DIAGNOSIS — F17210 Nicotine dependence, cigarettes, uncomplicated: Secondary | ICD-10-CM

## 2022-11-23 DIAGNOSIS — Z01810 Encounter for preprocedural cardiovascular examination: Secondary | ICD-10-CM

## 2022-11-23 DIAGNOSIS — Z9861 Coronary angioplasty status: Secondary | ICD-10-CM | POA: Diagnosis not present

## 2022-11-23 DIAGNOSIS — E119 Type 2 diabetes mellitus without complications: Secondary | ICD-10-CM

## 2022-11-23 DIAGNOSIS — I2510 Atherosclerotic heart disease of native coronary artery without angina pectoris: Secondary | ICD-10-CM | POA: Diagnosis not present

## 2022-11-23 DIAGNOSIS — I739 Peripheral vascular disease, unspecified: Secondary | ICD-10-CM | POA: Diagnosis present

## 2022-11-23 HISTORY — DX: Atherosclerotic heart disease of native coronary artery without angina pectoris: I25.10

## 2022-11-23 MED ORDER — REPATHA 140 MG/ML ~~LOC~~ SOSY: 140.0000 mg | PREFILLED_SYRINGE | SUBCUTANEOUS | 3 refills | Status: DC

## 2022-11-23 NOTE — Patient Instructions (Signed)
Medication Instructions:  Your physician has recommended you make the following change in your medication:  Start the Repatha, will send in to pharmacy  *If you need a refill on your cardiac medications before your next appointment, please call your pharmacy*   Lab Work: NONE If you have labs (blood work) drawn today and your tests are completely normal, you will receive your results only by: MyChart Message (if you have MyChart) OR A paper copy in the mail If you have any lab test that is abnormal or we need to change your treatment, we will call you to review the results.   Testing/Procedures: Your physician has requested that you have an ankle brachial index (ABI). During this test an ultrasound and blood pressure cuff are used to evaluate the arteries that supply the arms and legs with blood. Allow thirty minutes for this exam. There are no restrictions or special instructions.    Follow-Up: At [facility], you and your health needs are our priority.  As part of our continuing mission to provide you with exceptional heart care, we have created designated Provider Care Teams.  These Care Teams include your primary Cardiologist (physician) and Advanced Practice Providers (APPs -  Physician Assistants and Nurse Practitioners) who all work together to provide you with the care you need, when you need it.  We recommend signing up for the patient portal called "MyChart".  Sign up information is provided on this After Visit Summary.  MyChart is used to connect with patients for Virtual Visits (Telemedicine).  Patients are able to view lab/test results, encounter notes, upcoming appointments, etc.  Non-urgent messages can be sent to your provider as well.   To learn more about what you can do with MyChart, go to [email or web address].    Your next appointment:   3 month(s)  Provider:   Wallis Bamberg, NP Rosalita Levan)    Other Instructions

## 2022-11-24 ENCOUNTER — Telehealth: Payer: Self-pay | Admitting: Cardiology

## 2022-11-24 ENCOUNTER — Telehealth: Payer: Self-pay

## 2022-11-24 DIAGNOSIS — I2510 Atherosclerotic heart disease of native coronary artery without angina pectoris: Secondary | ICD-10-CM

## 2022-11-24 NOTE — Telephone Encounter (Signed)
Pt uable to afford Repatha. Per Wallis Bamberg, NP referral made to Pharm D.

## 2022-11-24 NOTE — Telephone Encounter (Signed)
Pt c/o medication issue:  1. Name of Medication:   Evolocumab (REPATHA) 140 MG/ML SOSY   2. How are you currently taking this medication (dosage and times per day)?  Not started yet  3. Are you having a reaction (difficulty breathing--STAT)?   4. What is your medication issue?   Patient stated this medication costs too much and wants to get alternate medication or assistance getting this medication.

## 2022-11-30 ENCOUNTER — Ambulatory Visit: Payer: PRIVATE HEALTH INSURANCE

## 2022-11-30 ENCOUNTER — Telehealth: Payer: Self-pay

## 2022-11-30 NOTE — Telephone Encounter (Signed)
Pharm D approved pt to have phone visit for Repatha. Referral put back in system. Pt notified.

## 2022-11-30 NOTE — Telephone Encounter (Signed)
Patient stated he is unable to go to [facility] for PharmD appointment and would like to speak with someone about other options.

## 2022-11-30 NOTE — Telephone Encounter (Signed)
Sent message to Pharm D to see if they would consider a Virtual/phone visit with this pt.

## 2022-12-06 ENCOUNTER — Other Ambulatory Visit: Payer: Self-pay | Admitting: Cardiology

## 2022-12-07 ENCOUNTER — Other Ambulatory Visit: Payer: Self-pay | Admitting: Pharmacist

## 2022-12-07 MED ORDER — ATORVASTATIN CALCIUM 10 MG PO TABS: 5.0000 mg | ORAL_TABLET | Freq: Every day | ORAL | 11 refills | Status: DC

## 2022-12-07 NOTE — Telephone Encounter (Signed)
Received referral for lipid clinic, but patient unable to come to clinic. He stopped Repatha a while ago due to cost. Intolerant to rosuvastatin but doesn't sound like he has tried any other statins. On zetia He is willing to try another statin. Will start atorvastatin 5mg  daily. I will also send him application for [employer] pt assistance. I will follow up with him in a few weeks to see how he is doing on atorvastatin.

## 2022-12-18 ENCOUNTER — Other Ambulatory Visit: Payer: Self-pay | Admitting: Cardiology

## 2022-12-18 ENCOUNTER — Ambulatory Visit: Payer: Medicare Other | Attending: Cardiology

## 2022-12-18 DIAGNOSIS — I739 Peripheral vascular disease, unspecified: Secondary | ICD-10-CM | POA: Diagnosis present

## 2022-12-18 LAB — VAS US ABI WITH/WO TBI
Left ABI: 0.74
Right ABI: 0.58

## 2022-12-22 ENCOUNTER — Ambulatory Visit: Payer: Medicare Other | Admitting: Cardiology

## 2022-12-25 ENCOUNTER — Ambulatory Visit: Payer: Medicare Other | Attending: Cardiology

## 2022-12-25 ENCOUNTER — Telehealth: Payer: Self-pay

## 2022-12-25 DIAGNOSIS — I739 Peripheral vascular disease, unspecified: Secondary | ICD-10-CM

## 2022-12-25 NOTE — Telephone Encounter (Signed)
[employer] application for Repatha assistance completed and faxed

## 2023-01-04 ENCOUNTER — Telehealth: Payer: Self-pay | Admitting: *Deleted

## 2023-01-04 DIAGNOSIS — R6889 Other general symptoms and signs: Secondary | ICD-10-CM

## 2023-01-04 NOTE — Telephone Encounter (Signed)
Informed pt of abnormal ABI test and will refer pt to Vein and Vascular for further evaluation. Pt verbalized understanding and had no further questions.

## 2023-01-04 NOTE — Telephone Encounter (Signed)
-----   Message from Flossie Dibble sent at 12/25/2022  1:57 PM EDT ----- Abnormal ABI exam. Please refer to vascular vein specialist in [place].

## 2023-01-06 NOTE — Telephone Encounter (Signed)
Application corrected and refax on 12/30/2022

## 2023-01-07 ENCOUNTER — Telehealth: Payer: Self-pay

## 2023-01-07 ENCOUNTER — Telehealth: Payer: Self-pay | Admitting: Cardiology

## 2023-01-07 ENCOUNTER — Encounter: Payer: Self-pay | Admitting: *Deleted

## 2023-01-07 NOTE — Telephone Encounter (Signed)
S/w patient and aware we are needing proof of income for both persons listed on his application. I will await for that information

## 2023-01-07 NOTE — Telephone Encounter (Signed)
Patient says he needs a letter to attend massage therapy/ please call patient at [phone] Thank you

## 2023-01-07 NOTE — Telephone Encounter (Signed)
Spoke with pt, because of the blockage they found in his legs, they want a letter that it is okay for him to have a massage. Aware will ask and let him know. He reports he will come to pick the letter up.

## 2023-01-07 NOTE — Telephone Encounter (Signed)
Spoke with pt, aware he needs to wait for the massage of the legs until after he sees vascular folks. He was given the okay to get a massage of his neck and shoulders. Letter generated and placed at the front desk for pick up.

## 2023-01-08 NOTE — Telephone Encounter (Signed)
Pts. Letter was scanned to his personnal email.  I spoke to the patient and advised if not received to call us back./glc 01/08/23

## 2023-01-08 NOTE — Telephone Encounter (Signed)
Patient is following up. He would like to know if it will be possible to have the letter e-mailed to him at [email or web address].

## 2023-01-12 ENCOUNTER — Telehealth: Payer: Self-pay

## 2023-01-12 MED ORDER — REPATHA SURECLICK 140 MG/ML ~~LOC~~ SOAJ: 1.0000 mL | SUBCUTANEOUS | 3 refills | Status: DC

## 2023-01-12 NOTE — Telephone Encounter (Addendum)
Called patients [facility] to inform them of the information for the Health Well grant and it should be used the Repatha. Also informed the tech/RPh that it should be ran after medicare similarly to a secondary insurance. They did not have the script yet, but will call later today to make sure it is ran under medicare and the health well grant.  Also, had them discontinue the syringes so it does not get accidentally get filled.

## 2023-01-12 NOTE — Telephone Encounter (Addendum)
Called and spoke with patient. Doing fine on atorvastatin 5mg  daily. Now that healthwell grant is reopen we can enroll him. He is in agreement. Grant activated.  Card No. [id number]  BIN [id number]  PCN [id number]  PC Group [id number]  Kennedy Bucker info [facility] and Rx for auto injector sent

## 2023-01-12 NOTE — Addendum Note (Signed)
Addended by: Malena Peer D on: 01/12/2023 12:36 PM   Modules accepted: Orders

## 2023-02-05 ENCOUNTER — Encounter: Payer: PRIVATE HEALTH INSURANCE | Admitting: Vascular Surgery

## 2023-02-08 NOTE — Progress Notes (Unsigned)
VASCULAR AND VEIN SPECIALISTS OF   ASSESSMENT / PLAN: [name] Dickerson is a 77 y.o. male with atherosclerosis of native arteries of bilateral lower extremities - right worse than left causing intermittent claudication.  Patient counseled patients with asymptomatic peripheral arterial disease or claudication have a 1-2% risk of developing chronic limb threatening ischemia, but a 15-30% risk of mortality in the next 5 years. Intervention should only be considered for medically optimized patients with disabling symptoms.   Recommend:  Abstinence from all tobacco products. Blood glucose control with goal A1c < 7%. Blood pressure control with goal blood pressure < 140/90 mmHg. Lipid reduction therapy with goal LDL-C <100 mg/dL Aspirin 81mg  PO QD.  Atorvastatin 40-80mg  PO QD (or other "high intensity" statin therapy).  Recommend 3 month trial of medical therapy. Encouraged to quit smoking. OK to do massage / physical therapy to lower extremities. Referred to cardiac rehab. Will see back in 3 months with ABI.  CHIEF COMPLAINT: cramping pain with walking  HISTORY OF PRESENT ILLNESS: [name] Dickerson is a 77 y.o. male referred to clinic for evaluation of cramping discomfort with walking.  The patient also describes back pain which radiates down the legs and is worse with standing.  He has fairly typical symptoms of both neurogenic and vascular claudication.  He does not have rest pain symptoms.  He has no ulcers about his feet.  He is mostly bothered about the discomfort in his lower back.  He reports massage therapies have been very helpful for him, but massage therapist has been reluctant to do massage for him because of his recent diagnosis of peripheral arterial disease.  We reviewed the natural history of peripheral arterial disease.  I explained the benign nature of the diagnosis of claudication.  I explained the rationale for smoking cessation and walk therapy.   Past Medical History:   Diagnosis Date   CAD S/P percutaneous coronary angioplasty 1995, 2003   Inferior STEMI-PTCA RCA; 2003: Overlapping MultiLink Zeta BMS -RCA (4.0 x 23, 4.0 x 15); 09/2001 Cutting Balloon PTCA of stents for ISR.;; Myoview August 2013 - Mod-severe sized fixed INFERIOR perfusion defect => likely infarct with mild peri-infarct ischemia in the apical inferior wall, EF 55% with distal inferior hypokinesis   Essential hypertension    Hyperlipidemia LDL goal <70    Hypothyroidism    Low testosterone    Moderate cigarette smoker (10-19 per day)    Was down to 5 cigarettes a day.   ST elevation myocardial infarction (STEMI) of inferior wall, subsequent episode of care [facility]) 1995   PTCA of RCA; large inferior scar noted on Myoview. Mid inferior and inferoseptal hypokinesis on echocardiogram November 2014. EF 50-55%.   Stenosis of coronary stent 2012   Mid RCA Occlusion with Right-To-Left and Left-To-Right Collaterals.; Relatively normal LAD, ramus intermedius and circumflex-none.    Past Surgical History:  Procedure Laterality Date   CARDIAC CATHETERIZATION  March 2012   Mid occlusion of RCA with Right to Left and Left to Right collaterals; LAD, ramus and circumflex/OM widely patent   CORONARY ANGIOPLASTY  1995   Inferior STEMI-PTCA of RCA   CORONARY STENT PLACEMENT  2003   MultiLink Zeta BMS 4.0 mm x 23 mm, 4.0 mm x 15 mm --> Cutting Balloon PTCA for ISR several months later.   LEFT HEART CATH AND CORONARY ANGIOGRAPHY N/A 09/17/2022   Procedure: LEFT HEART CATH AND CORONARY ANGIOGRAPHY;  Surgeon: [name], MD;  Location: [facility];  Service: Cardiovascular;  Laterality: N/A;   LexiScan Myoview  August 2013   Moderate-severe sized INFERIOR perfusion defect with no evidence of ischemia. EF 55% with distal inferior HK. Large sized Inferior and Inferolateral Infarct with perhaps mild apical inferior ischemia.   TRANSTHORACIC ECHOCARDIOGRAM  01/09/2013   EF 50-55%; normal LV size and  function. Probable mild HJ of the mid inferior and inferoseptal wall.; No valvular lesions.    History reviewed. No pertinent family history.  Social History   Socioeconomic History   Marital status: Married    Spouse name: Not on file   Number of children: Not on file   Years of education: Not on file   Highest education level: Not on file  Occupational History   Not on file  Tobacco Use   Smoking status: Every Day    Current packs/day: 1.00    Average packs/day: 1 pack/day for 40.0 years (40.0 ttl pk-yrs)    Types: Cigarettes   Smokeless tobacco: Never  Substance and Sexual Activity   Alcohol use: No   Drug use: No   Sexual activity: Not on file  Other Topics Concern   Not on file  Social History Narrative   Married father of 3, grandfather 5. Works out occasionally on the treadmill. Coping to try get back into some exercise now. Unfortunately back up to smoking one pack per day. Does not drink alcohol.      Date from from 5 or 6 cigarettes a day. He's tried electronic cigarettes and down without affect.   Social Determinants of Health   Financial Resource Strain: Medium Risk (10/05/2022)   Received from [facility]   Overall Financial Resource Strain (CARDIA)    Difficulty of Paying Living Expenses: Somewhat hard  Food Insecurity: No Food Insecurity (10/05/2022)   Received from [facility]   Hunger Vital Sign    Worried About Running Out of Food in the Last Year: Never true    Ran Out of Food in the Last Year: Never true  Transportation Needs: No Transportation Needs (10/05/2022)   [facility] - Transportation    Lack of Transportation (Medical): No    Lack of Transportation (Non-Medical): No  Physical Activity: Not on file  Stress: Not on file  Social Connections: Unknown (10/02/2022)   Received from [facility]   Social Network    Social Network: Not on file  Intimate Partner Violence: Unknown (10/02/2022)   Received from [facility]    HITS    Physically Hurt: Not on file    Insult or Talk Down To: Not on file    Threaten Physical Harm: Not on file    Scream or Curse: Not on file    Allergies  Allergen Reactions   Statins Other (See Comments)    Soreness with Crestor    Current Outpatient Medications  Medication Sig Dispense Refill   ALPRAZolam (XANAX) 0.5 MG tablet Take 0.5 mg by mouth at bedtime.     aspirin EC 81 MG tablet Take 1 tablet (81 mg total) by mouth daily. Swallow whole. 90 tablet 2   atorvastatin (LIPITOR) 10 MG tablet Take 0.5 tablets (5 mg total) by mouth daily. 15 tablet 11   Cinnamon 500 MG TABS Take 2,000 mg by mouth 2 (two) times daily.     clopidogrel (PLAVIX) 75 MG tablet Take 1 tablet (75 mg total) by mouth daily with breakfast. 90 tablet 2   cyanocobalamin 1000 MCG tablet Take 1,000 mcg by mouth daily.  Evolocumab (REPATHA SURECLICK) 140 MG/ML SOAJ Inject 140 mg into the skin every 14 (fourteen) days. 6 mL 3   isosorbide mononitrate (IMDUR) 30 MG 24 hr tablet Take 1 tablet (30 mg total) by mouth daily. 90 tablet 3   levothyroxine (SYNTHROID, LEVOTHROID) 200 MCG tablet Take 200 mcg by mouth daily before breakfast.     lisinopril (ZESTRIL) 30 MG tablet Take 1 tablet (30 mg total) by mouth daily.     meloxicam (MOBIC) 15 MG tablet Take 15 mg by mouth daily.     metoprolol tartrate (LOPRESSOR) 50 MG tablet TAKE 1 TABLET BY MOUTH TWICE A DAY 180 tablet 1   nitroGLYCERIN (NITROSTAT) 0.4 MG SL tablet Place 1 tablet (0.4 mg total) under the tongue every 5 (five) minutes as needed for chest pain. 25 tablet 2   Semaglutide (OZEMPIC, 0.25 OR 0.5 MG/DOSE, Nelson) Inject 0.5 mg into the skin once a week.     tamsulosin (FLOMAX) 0.4 MG CAPS capsule Take 0.4 mg by mouth daily.     traMADol (ULTRAM) 50 MG tablet Take 50 mg by mouth every 6 (six) hours as needed for moderate pain or severe pain.     No current facility-administered medications for this visit.    PHYSICAL EXAM Vitals:   02/09/23 1249   BP: (!) 167/89  Pulse: 69  Resp: 20  Temp: 98 F (36.7 C)  SpO2: 97%  Weight: 189 lb (85.7 kg)  Height: 5\' 8"  (1.727 m)    Elderly man in no distress Regular rate and rhythm Unlabored breathing No palpable pedal pulses No ulcers about the feet   PERTINENT LABORATORY AND RADIOLOGIC DATA  Most recent CBC    Latest Ref Rng & Units 09/17/2022    1:29 PM 09/16/2022    6:41 PM 05/15/2010   10:41 AM  CBC  WBC 4.0 - 10.5 K/uL 5.8  6.1  5.5   Hemoglobin 13.0 - 17.0 g/dL 56.4  33.2  95.1   Hematocrit 39.0 - 52.0 % 43.8  45.9  44.3   Platelets 150 - 400 K/uL 130  125  139      Most recent CMP    Latest Ref Rng & Units 09/17/2022    1:29 PM 09/16/2022    6:41 PM 05/27/2022    2:58 PM  CMP  Glucose 70 - 99 mg/dL 92  884    BUN 8 - 23 mg/dL 19  13    Creatinine 1.66 - 1.24 mg/dL 0.63  0.16    Sodium 010 - 145 mmol/L 138  136    Potassium 3.5 - 5.1 mmol/L 3.8  3.9    Chloride 98 - 111 mmol/L 101  99    CO2 22 - 32 mmol/L 25  25    Calcium 8.9 - 10.3 mg/dL 8.9  9.2    Total Protein 6.5 - 8.1 g/dL  6.6    Total Bilirubin 0.3 - 1.2 mg/dL  0.7    Alkaline Phos 38 - 126 U/L  52    AST 15 - 41 U/L  54  18   ALT 0 - 44 U/L  25  18     Renal function CrCl cannot be calculated (Patient's most recent lab result is older than the maximum 21 days allowed.).  Hgb A1c MFr Bld (%)  Date Value  09/16/2022 6.2 (H)    LDL Chol Calc (NIH)  Date Value Ref Range Status  05/27/2022 96 0 - 99 mg/dL Final   LDL Cholesterol  Date Value Ref Range Status  09/17/2022 83 0 - 99 mg/dL Final    Comment:           Total Cholesterol/HDL:CHD Risk Coronary Heart Disease Risk Table                     Men   Women  1/2 Average Risk   3.4   3.3  Average Risk       5.0   4.4  2 X Average Risk   9.6   7.1  3 X Average Risk  23.4   11.0        Use the calculated Patient Ratio above and the CHD Risk Table to determine the patient's CHD Risk.        ATP III CLASSIFICATION (LDL):  <100     mg/dL    Optimal  621-308  mg/dL   Near or Above                    Optimal  130-159  mg/dL   Borderline  657-846  mg/dL   High  >962     mg/dL   Very High Performed at [facility], [address], [place], [place] [address]    LDL Direct  Date Value Ref Range Status  07/04/2021 121 (H) 0 - 99 mg/dL Final     +-------+-----------+-----------+------------+------------+  ABI/TBIToday's ABIToday's TBIPrevious ABIPrevious TBI  +-------+-----------+-----------+------------+------------+  Right .58        .40                                  +-------+-----------+-----------+------------+------------+  Left  .74        .62                                  +-------+-----------+-----------+------------+------------+   Arterial duplex:  Right: Total occlusion noted in the aortoiliac segment. Total occlusion  noted in the iliac segment. Right external iliac artery occlusion.  Monophasic flow throughout the right lower extremity.   Left: Total occlusion noted in the superficial femoral artery. Mid to  distal left SFA occlusion versus trickle flow.   Rande Brunt. Lenell Antu, MD [facility] Vascular and Vein Specialists of [facility] Phone Number: [phone] 02/09/2023 4:19 PM   Total time spent on preparing this encounter including chart review, data review, collecting history, examining the patient, coordinating care for this new patient, 60 minutes.  Portions of this report may have been transcribed using voice recognition software.  Every effort has been made to ensure accuracy; however, inadvertent computerized transcription errors may still be present.

## 2023-02-09 ENCOUNTER — Ambulatory Visit (INDEPENDENT_AMBULATORY_CARE_PROVIDER_SITE_OTHER): Payer: Medicare Other | Admitting: Vascular Surgery

## 2023-02-09 ENCOUNTER — Encounter: Payer: Self-pay | Admitting: Vascular Surgery

## 2023-02-09 VITALS — BP 167/89 | HR 69 | Temp 98.0°F | Resp 20 | Ht 68.0 in | Wt 189.0 lb

## 2023-02-09 DIAGNOSIS — I70213 Atherosclerosis of native arteries of extremities with intermittent claudication, bilateral legs: Secondary | ICD-10-CM | POA: Diagnosis not present

## 2023-02-10 ENCOUNTER — Telehealth: Payer: Self-pay

## 2023-02-10 NOTE — Telephone Encounter (Signed)
Patient aware of the status of his application. Patient must complete application with [employer] support plus at [phone]. Instructed the patient of the following:  -[facility] refer him -He is a Repatha patient with Medicare Part D -He is calling to apply for 3rd part funding.   Patient aware if funding is not available, he must call us, then we will call [employer] to reopen his case.

## 2023-02-11 NOTE — Telephone Encounter (Signed)
Spoke with the patient, provided [employer] phone number for the patient to call and verify if [facility] was the sender. This way he knows where call for refills if approved.

## 2023-02-11 NOTE — Telephone Encounter (Signed)
Patient is following up. He states the Repatha 140 has been sent to his house, but he has not received any updates from [employer].

## 2023-02-12 ENCOUNTER — Other Ambulatory Visit: Payer: Self-pay | Admitting: Cardiology

## 2023-02-12 ENCOUNTER — Encounter: Payer: Self-pay | Admitting: Cardiology

## 2023-02-12 NOTE — Progress Notes (Signed)
 " Cardiology Office Note:  .   Date:  02/15/2023  ID:  [name] Dickerson, DOB 1945-04-10, MRN [id number] PCP: Dawayne Kerney SQUIBB, MD  [facility] Providers Cardiologist:  Lamar Fitch, MD    History of Present Illness: .   [name] Dickerson is a 77 y.o. male with a past medical history of hypertension, CAD, DM2, hypothyroidism, tobacco abuse, dyslipidemia.  1995 STEMI PTCA of RCA 2003 PCI with overlapping BMS x 2 mid RCA 2003 Cutting Balloon PTCA for ISR 2012 mRCA occlusion with right to right bridging and left-to-right collaterals 2013 stress Myoview  showing large inferolateral infarct with no ischemia 2021 stress Myoview  consistent with prior MI 09/17/2022 left heart cath CTO of proximal RCA with mild to moderate diffuse disease elsewhere recommendations for medical management  Admitted 09/16/2022 for non-STEMI underwent left heart cath on 09/18/2022 which revealed CTO of proximal RCA with mild to moderate diffuse disease elsewhere, recommendations for medical therapy.  Evaluated on 11/23/2022, we started him on Repatha  and sent him to vascular for PAD.  Evaluated by vascular for PAD, he was advised to increase his walking and stop smoking, follow-up with him in 3 months.  He presents today for follow-up of his coronary artery disease.  He is doing well from a cardiac perspective, offers no formal complaints.  Is trying to increase his amount of exercise per recommendations of EVS to walking 20 minutes/day.  He continues to struggle with smoking cessation, he is the caregiver for his wife who suffers from Alzheimer's, she is housebound, cannot be left alone--unfortunately smoking is a reprieve for him at this point he is not interested in cessation. He denies chest pain, palpitations, dyspnea, pnd, orthopnea, n, v, dizziness, syncope, edema, weight gain, or early satiety.  ROS: Review of Systems  Musculoskeletal:  Positive for back pain.  All other systems reviewed and are negative.    Studies  Reviewed: .        Cardiac Studies & Procedures   CARDIAC CATHETERIZATION  CARDIAC CATHETERIZATION 09/17/2022  Narrative   Mid LAD lesion is 40% stenosed.   Dist LM lesion is 20% stenosed.   Ramus lesion is 30% stenosed.   Ost RCA to Prox RCA lesion is 100% stenosed.  1.  Chronic total occlusion of proximal right coronary artery with mild to moderate diffuse disease elsewhere. 2.  LVEDP of 21 mmHg.  Recommendation: The results were reviewed with the team C attending Dr. Jordan and medical therapy will be pursued.  Findings Coronary Findings Diagnostic  Dominance: Right  Left Main Dist LM lesion is 20% stenosed.  Left Anterior Descending There is mild diffuse disease throughout the vessel. Mid LAD lesion is 40% stenosed.  Ramus Intermedius Ramus lesion is 30% stenosed.  Right Coronary Artery Ost RCA to Prox RCA lesion is 100% stenosed. The lesion was previously treated .  Third Right Posterolateral Branch Collaterals 3rd RPL filled by collaterals from 3rd Mrg.  Collaterals 3rd RPL filled by collaterals from 3rd Mrg.  Collaterals 3rd RPL filled by collaterals from 3rd Mrg.  Intervention  No interventions have been documented.   STRESS TESTS  MYOCARDIAL PERFUSION IMAGING 10/10/2019  Narrative  The left ventricular ejection fraction is mildly decreased (45-54%).  Nuclear stress EF: 45%.  There was no ST segment deviation noted during stress.  Defect 1: There is a medium defect of moderate severity present in the basal inferior, basal inferolateral, mid inferior, mid inferolateral and apex location.  Findings consistent with prior myocardial infarction.  This  is an intermediate risk study.  ECHOCARDIOGRAM  ECHOCARDIOGRAM COMPLETE 07/10/2021  Narrative ECHOCARDIOGRAM REPORT    Patient Name:   [name] Dickerson [facility] Date of Exam: 07/10/2021 Medical Rec #:  [id number]    Height:       68.0 in Accession #:    [id number]   Weight:       193.0 lb Date of Birth:   16-Jan-1946   BSA:          2.013 m Patient Age:    75 years     BP:           112/70 mmHg Patient Gender: M            HR:           74 bpm. Exam Location:  [facility]  Procedure: 2D Echo, Cardiac Doppler, Color Doppler and Strain Analysis  Indications:    Coronary artery disease, occlusive [I25.10 (ICD-10-CM)]; Essential hypertension [I10 (ICD-10-CM)]; Type 2 diabetes mellitus without complication, without long-term current use of insulin  (HCC) [E11.9 (ICD-10-CM)]; Moderate cigarette smoker (10-19 per day) [F17.210 (ICD-10-CM)]  History:        Patient has prior history of Echocardiogram examinations, most recent 01/09/2013. CAD and Previous Myocardial Infarction; Risk Factors:Hypertension and Dyslipidemia.  Sonographer:    Charlie Jointer RDCS Referring Phys: [id number] ROBERT J KRASOWSKI  IMPRESSIONS   1. Left ventricular ejection fraction, by estimation, is 60 to 65%. The left ventricle has normal function. The left ventricle has no regional wall motion abnormalities. There is mild left ventricular hypertrophy. Left ventricular diastolic parameters are consistent with Grade I diastolic dysfunction (impaired relaxation). 2. Right ventricular systolic function is normal. The right ventricular size is normal. 3. The mitral valve is normal in structure. No evidence of mitral valve regurgitation. No evidence of mitral stenosis. 4. The aortic valve is normal in structure. Aortic valve regurgitation is not visualized. No aortic stenosis is present. 5. The inferior vena cava is normal in size with greater than 50% respiratory variability, suggesting right atrial pressure of 3 mmHg.  FINDINGS Left Ventricle: Left ventricular ejection fraction, by estimation, is 60 to 65%. The left ventricle has normal function. The left ventricle has no regional wall motion abnormalities. The left ventricular internal cavity size was normal in size. There is mild left ventricular hypertrophy. Left ventricular  diastolic parameters are consistent with Grade I diastolic dysfunction (impaired relaxation).  Right Ventricle: The right ventricular size is normal. No increase in right ventricular wall thickness. Right ventricular systolic function is normal.  Left Atrium: Left atrial size was normal in size.  Right Atrium: Right atrial size was normal in size.  Pericardium: There is no evidence of pericardial effusion.  Mitral Valve: The mitral valve is normal in structure. No evidence of mitral valve regurgitation. No evidence of mitral valve stenosis.  Tricuspid Valve: The tricuspid valve is normal in structure. Tricuspid valve regurgitation is not demonstrated. No evidence of tricuspid stenosis.  Aortic Valve: The aortic valve is normal in structure. Aortic valve regurgitation is not visualized. No aortic stenosis is present.  Pulmonic Valve: The pulmonic valve was normal in structure. Pulmonic valve regurgitation is not visualized. No evidence of pulmonic stenosis.  Aorta: The aortic root is normal in size and structure.  Venous: The inferior vena cava is normal in size with greater than 50% respiratory variability, suggesting right atrial pressure of 3 mmHg.  IAS/Shunts: No atrial level shunt detected by color flow Doppler.   LEFT VENTRICLE PLAX 2D LVIDd:  5.00 cm   Diastology LVIDs:         3.50 cm   LV e' medial:    6.09 cm/s LV PW:         1.30 cm   LV E/e' medial:  14.5 LV IVS:        1.10 cm   LV e' lateral:   6.09 cm/s LVOT diam:     2.00 cm   LV E/e' lateral: 14.5 LV SV:         58 LV SV Index:   29 LVOT Area:     3.14 cm   RIGHT VENTRICLE            IVC RV Basal diam:  2.70 cm    IVC diam: 2.10 cm RV S prime:     7.07 cm/s TAPSE (M-mode): 1.3 cm  LEFT ATRIUM             Index        RIGHT ATRIUM           Index LA diam:        3.50 cm 1.74 cm/m   RA Area:     11.70 cm LA Vol (A2C):   43.8 ml 21.76 ml/m  RA Volume:   22.20 ml  11.03 ml/m LA Vol (A4C):   22.8 ml  11.33 ml/m LA Biplane Vol: 32.9 ml 16.34 ml/m AORTIC VALVE LVOT Vmax:   95.30 cm/s LVOT Vmean:  55.600 cm/s LVOT VTI:    0.184 m  AORTA Ao Root diam: 3.40 cm Ao Asc diam:  2.90 cm  MITRAL VALVE MV Area (PHT): 5.07 cm     SHUNTS MV Decel Time: 150 msec     Systemic VTI:  0.18 m MV E velocity: 88.25 cm/s   Systemic Diam: 2.00 cm MV A velocity: 104.00 cm/s MV E/A ratio:  0.85  Lamar Fitch MD Electronically signed by Lamar Fitch MD Signature Date/Time: 07/10/2021/5:29:19 PM    Final             Risk Assessment/Calculations:             Physical Exam:   VS:  BP 138/86   Pulse 72   Ht 5' 8 (1.727 m)   Wt 191 lb 9.6 oz (86.9 kg)   SpO2 98%   BMI 29.13 kg/m    Wt Readings from Last 3 Encounters:  02/15/23 191 lb 9.6 oz (86.9 kg)  02/09/23 189 lb (85.7 kg)  11/23/22 185 lb (83.9 kg)    GEN: Well nourished, well developed in no acute distress NECK: No JVD; No carotid bruits CARDIAC: RRR, no murmurs, rubs, gallops RESPIRATORY:  Clear to auscultation without rales, wheezing or rhonchi  ABDOMEN: Soft, non-tender, non-distended EXTREMITIES:  No edema; No deformity   ASSESSMENT AND PLAN: .   CAD-extensive coronary artery disease dating back to 1995, most recent left heart cath in July of this year revealed CTO of RCA, best suited for medical therapy. Stable with no anginal symptoms. No indication for ischemic evaluation.  Continue aspirin  81 mg daily, continue Plavix  75 mg daily, continue Imdur  30 mg daily, continue metoprolol  50 mg twice daily, continue nitroglycerin  as needed--has not needed, continue Repatha .  Hypertension-blood pressure is controlled today at 138/86, continue lisinopril  30 mg daily, blood pressure is marginally controlled if he continues to be elevated we will increase his lisinopril  to 40 mg daily.  Dyslipidemia-currently on Repatha , will repeat FLP and LFTs in 1 month.  Tobacco abuse-currently  smoking 1 PPD, not interested in cessation at  this time, we discussed this at length, he is a primary caregiver for his wife who suffers with Alzheimer's and smoking is a reprieve for him at this time.  He is aware of the deleterious effects of this, advised pharmacological help with cessation however he politely declines.  PAD -evaluated by vascular with recommendations for medical management, will follow back up with them in March.         Dispo: FLP and LFTs in 1 months, follow back up in 6 months.  Signed, Delon JAYSON Hoover, NP  "

## 2023-02-15 ENCOUNTER — Ambulatory Visit: Payer: Medicare Other | Attending: Cardiology | Admitting: Cardiology

## 2023-02-15 ENCOUNTER — Encounter: Payer: Self-pay | Admitting: Cardiology

## 2023-02-15 VITALS — BP 138/86 | HR 72 | Ht 68.0 in | Wt 191.6 lb

## 2023-02-15 DIAGNOSIS — I739 Peripheral vascular disease, unspecified: Secondary | ICD-10-CM | POA: Diagnosis not present

## 2023-02-15 DIAGNOSIS — E119 Type 2 diabetes mellitus without complications: Secondary | ICD-10-CM

## 2023-02-15 DIAGNOSIS — I10 Essential (primary) hypertension: Secondary | ICD-10-CM | POA: Diagnosis not present

## 2023-02-15 DIAGNOSIS — I2510 Atherosclerotic heart disease of native coronary artery without angina pectoris: Secondary | ICD-10-CM

## 2023-02-15 DIAGNOSIS — E785 Hyperlipidemia, unspecified: Secondary | ICD-10-CM

## 2023-02-15 DIAGNOSIS — Z79899 Other long term (current) drug therapy: Secondary | ICD-10-CM

## 2023-02-15 NOTE — Patient Instructions (Signed)
Medication Instructions:  Your physician recommends that you continue on your current medications as directed. Please refer to the Current Medication list given to you today.  *If you need a refill on your cardiac medications before your next appointment, please call your pharmacy*   Lab Work: Your physician recommends that you return for lab work in: 1 month for Fasting Lipid Panel and Liver Function Test Lab opens at 8am. You DO NOT NEED an appointment. Best time to come is between 8am and 11:45 and between 1:30 and 4:30. If you have been asked to fast for your blood work please have nothing to eat or drink after midnight. You may have water.   If you have labs (blood work) drawn today and your tests are completely normal, you will receive your results only by: MyChart Message (if you have MyChart) OR A paper copy in the mail If you have any lab test that is abnormal or we need to change your treatment, we will call you to review the results.   Testing/Procedures: NONE   Follow-Up: At [facility], you and your health needs are our priority.  As part of our continuing mission to provide you with exceptional heart care, we have created designated Provider Care Teams.  These Care Teams include your primary Cardiologist (physician) and Advanced Practice Providers (APPs -  Physician Assistants and Nurse Practitioners) who all work together to provide you with the care you need, when you need it.  We recommend signing up for the patient portal called "MyChart".  Sign up information is provided on this After Visit Summary.  MyChart is used to connect with patients for Virtual Visits (Telemedicine).  Patients are able to view lab/test results, encounter notes, upcoming appointments, etc.  Non-urgent messages can be sent to your provider as well.   To learn more about what you can do with MyChart, go to [email or web address].    Your next appointment:   6 month(s)  Provider:    Wallis Bamberg, NP Rosalita Levan)    Other Instructions

## 2023-02-16 ENCOUNTER — Other Ambulatory Visit: Payer: Self-pay

## 2023-02-16 DIAGNOSIS — I70213 Atherosclerosis of native arteries of extremities with intermittent claudication, bilateral legs: Secondary | ICD-10-CM

## 2023-04-19 ENCOUNTER — Other Ambulatory Visit: Payer: Self-pay | Admitting: Cardiology

## 2023-05-24 NOTE — Progress Notes (Unsigned)
 [facility] [place]  ASSESSMENT / PLAN: [name] Dickerson is a 78 y.o. male with atherosclerosis of native arteries of bilateral lower extremities - right worse than left causing intermittent claudication.  Patient counseled patients with asymptomatic peripheral arterial disease or claudication have a 1-2% risk of developing chronic limb threatening ischemia, but a 15-30% risk of mortality in the next 5 years. Intervention should only be considered for medically optimized patients with disabling symptoms.   Recommend:  Abstinence from all tobacco products. Blood glucose control with goal A1c < 7%. Blood pressure control with goal blood pressure < 140/90 mmHg. Lipid reduction therapy with goal LDL-C <100 mg/dL Aspirin 81mg  PO QD.  Atorvastatin 40-80mg  PO QD (or other "high intensity" statin therapy).  Recommend 3 month trial of medical therapy. Encouraged to quit smoking. OK to do massage / physical therapy to lower extremities. Referred to cardiac rehab. Will see back in 3 months with ABI.  CHIEF COMPLAINT: cramping pain with walking  HISTORY OF PRESENT ILLNESS: [name] Dickerson is a 78 y.o. male referred to clinic for evaluation of cramping discomfort with walking.  The patient also describes back pain which radiates down the legs and is worse with standing.  He has fairly typical symptoms of both neurogenic and vascular claudication.  He does not have rest pain symptoms.  He has no ulcers about his feet.  He is mostly bothered about the discomfort in his lower back.  He reports massage therapies have been very helpful for him, but massage therapist has been reluctant to do massage for him because of his recent diagnosis of peripheral arterial disease.  We reviewed the natural history of peripheral arterial disease.  I explained the benign nature of the diagnosis of claudication.  I explained the rationale for smoking cessation and walk therapy.   Past Medical History:   Diagnosis Date   Atherosclerotic heart disease of native coronary artery without angina pectoris 11/23/2022   CAD S/P percutaneous coronary angioplasty 1995, 2003   Inferior STEMI-PTCA RCA; 2003: Overlapping MultiLink Zeta BMS -RCA (4.0 x 23, 4.0 x 15); 09/2001 Cutting Balloon PTCA of stents for ISR.;; Myoview August 2013 - Mod-severe sized fixed INFERIOR perfusion defect => likely infarct with mild peri-infarct ischemia in the apical inferior wall, EF 55% with distal inferior hypokinesis   Coronary artery disease, occlusive 04/08/2011   Inferior STEMI-PTCA RCA; 2003: overlapping MultiLink Zeta BMS -RCA (4.0 mm x 23 mm, 4.0 mm x 15 mm); August 2003 Cutting Balloon PTCA of stents for ISR.;; Negative Myoview August 2013  Formatting of this note might be different from the original.  Overview:   Inferior STEMI-PTCA RCA; 2003: overlapping MultiLink Zeta BMS -RCA (4.0 mm x 23 mm, 4.0 mm x 15 mm); August 2003 Cutting Balloon PTCA of ste   Essential hypertension    Hyperlipidemia LDL goal <70    Hypothyroidism    Low testosterone    Moderate cigarette smoker (10-19 per day)    Was down to 5 cigarettes a day.   Myocardial infarction (HCC) 03/02/1993   PTCA of RCA  Formatting of this note might be different from the original.  Overview:   PTCA of RCA     Last Assessment & Plan:   His initial MI was related to his RCA, and basically all his cardiac history revolve around his RCA. Now noted to be occluded with scar noted on Myoview and echocardiogram. Does still have a preserved EF with no further anginal symptoms or heart  failure symptoms.  Conti   NSTEMI (non-ST elevated myocardial infarction) (HCC) 09/16/2022   ST elevation myocardial infarction (STEMI) of inferior wall, subsequent episode of care (HCC) 03/02/1993   PTCA of RCA; large inferior scar noted on Myoview. Mid inferior and inferoseptal hypokinesis on echocardiogram November 2014. EF 50-55%.   Stenosis of coronary stent 03/02/2010   Mid RCA  Occlusion with Right-To-Left and Left-To-Right Collaterals.; Relatively normal LAD, ramus intermedius and circumflex-none.    Past Surgical History:  Procedure Laterality Date   CARDIAC CATHETERIZATION  March 2012   Mid occlusion of RCA with Right to Left and Left to Right collaterals; LAD, ramus and circumflex/OM widely patent   CORONARY ANGIOPLASTY  1995   Inferior STEMI-PTCA of RCA   CORONARY STENT PLACEMENT  2003   MultiLink Zeta BMS 4.0 mm x 23 mm, 4.0 mm x 15 mm --> Cutting Balloon PTCA for ISR several months later.   LEFT HEART CATH AND CORONARY ANGIOGRAPHY N/A 09/17/2022   Procedure: LEFT HEART CATH AND CORONARY ANGIOGRAPHY;  Surgeon: [name], MD;  Location: [facility];  Service: Cardiovascular;  Laterality: N/A;   LexiScan Myoview  August 2013   Moderate-severe sized INFERIOR perfusion defect with no evidence of ischemia. EF 55% with distal inferior HK. Large sized Inferior and Inferolateral Infarct with perhaps mild apical inferior ischemia.   TRANSTHORACIC ECHOCARDIOGRAM  01/09/2013   EF 50-55%; normal LV size and function. Probable mild HJ of the mid inferior and inferoseptal wall.; No valvular lesions.    No family history on file.  Social History   Socioeconomic History   Marital status: Married    Spouse name: Not on file   Number of children: Not on file   Years of education: Not on file   Highest education level: Not on file  Occupational History   Not on file  Tobacco Use   Smoking status: Every Day    Current packs/day: 1.00    Average packs/day: 1 pack/day for 40.0 years (40.0 ttl pk-yrs)    Types: Cigarettes   Smokeless tobacco: Never  Substance and Sexual Activity   Alcohol use: No   Drug use: No   Sexual activity: Not on file  Other Topics Concern   Not on file  Social History Narrative   Married father of 3, grandfather 5. Works out occasionally on the treadmill. Coping to try get back into some exercise now. Unfortunately back up to  smoking one pack per day. Does not drink alcohol.      Date from from 5 or 6 cigarettes a day. He's tried electronic cigarettes and down without affect.   Social Drivers of Health   Financial Resource Strain: Medium Risk (10/05/2022)   Received from [facility]   Overall Financial Resource Strain (CARDIA)    Difficulty of Paying Living Expenses: Somewhat hard  Food Insecurity: No Food Insecurity (10/05/2022)   Received from [facility]   Hunger Vital Sign    Worried About Running Out of Food in the Last Year: Never true    Ran Out of Food in the Last Year: Never true  Transportation Needs: No Transportation Needs (10/05/2022)   Received from [facility] - Transportation    Lack of Transportation (Medical): No    Lack of Transportation (Non-Medical): No  Physical Activity: Not on file  Stress: Not on file  Social Connections: Unknown (10/02/2022)   [facility]   Social Network    Social Network: Not  on file  Intimate Partner Violence: Unknown (10/02/2022)   Received from [facility]   HITS    Physically Hurt: Not on file    Insult or Talk Down To: Not on file    Threaten Physical Harm: Not on file    Scream or Curse: Not on file    Allergies  Allergen Reactions   Statins Other (See Comments)    Soreness with Crestor    Current Outpatient Medications  Medication Sig Dispense Refill   ALPRAZolam (XANAX) 0.5 MG tablet Take 0.5 mg by mouth at bedtime.     aspirin EC 81 MG tablet Take 1 tablet (81 mg total) by mouth daily. Swallow whole. 90 tablet 2   atorvastatin (LIPITOR) 10 MG tablet Take 0.5 tablets (5 mg total) by mouth daily. 15 tablet 11   Cinnamon 500 MG TABS Take 2,000 mg by mouth 2 (two) times daily.     clopidogrel (PLAVIX) 75 MG tablet Take 1 tablet (75 mg total) by mouth daily with breakfast. 90 tablet 2   cyanocobalamin 1000 MCG tablet Take 1,000 mcg by mouth daily.     donepezil (ARICEPT) 5 MG tablet Take 5 mg by mouth at bedtime.      Evolocumab (REPATHA SURECLICK) 140 MG/ML SOAJ Inject 140 mg into the skin every 14 (fourteen) days. 6 mL 3   fenofibrate (TRICOR) 145 MG tablet Take 145 mg by mouth daily.     furosemide (LASIX) 20 MG tablet Take 20 mg by mouth daily.     gabapentin (NEURONTIN) 300 MG capsule Take 300 mg by mouth 3 (three) times daily.     isosorbide mononitrate (IMDUR) 30 MG 24 hr tablet Take 1 tablet (30 mg total) by mouth daily. 90 tablet 3   levothyroxine (SYNTHROID, LEVOTHROID) 200 MCG tablet Take 200 mcg by mouth daily before breakfast.     lisinopril (ZESTRIL) 30 MG tablet Take 1 tablet (30 mg total) by mouth daily.     meloxicam (MOBIC) 15 MG tablet Take 15 mg by mouth daily.     metoprolol tartrate (LOPRESSOR) 50 MG tablet TAKE 1 TABLET BY MOUTH TWICE A DAY 180 tablet 1   nitroGLYCERIN (NITROSTAT) 0.4 MG SL tablet Place 1 tablet (0.4 mg total) under the tongue every 5 (five) minutes as needed for chest pain. 25 tablet 2   Semaglutide (OZEMPIC, 0.25 OR 0.5 MG/DOSE, Joanna) Inject 0.5 mg into the skin once a week.     tamsulosin (FLOMAX) 0.4 MG CAPS capsule Take 0.4 mg by mouth daily.     traMADol (ULTRAM) 50 MG tablet Take 50 mg by mouth every 6 (six) hours as needed for moderate pain or severe pain.     No current facility-administered medications for this visit.    PHYSICAL EXAM There were no vitals filed for this visit.   Elderly man in no distress Regular rate and rhythm Unlabored breathing No palpable pedal pulses No ulcers about the feet   PERTINENT LABORATORY AND RADIOLOGIC DATA  Most recent CBC    Latest Ref Rng & Units 09/17/2022    1:29 PM 09/16/2022    6:41 PM 05/15/2010   10:41 AM  CBC  WBC 4.0 - 10.5 K/uL 5.8  6.1  5.5   Hemoglobin 13.0 - 17.0 g/dL 16.1  09.6  04.5   Hematocrit 39.0 - 52.0 % 43.8  45.9  44.3   Platelets 150 - 400 K/uL 130  125  139      Most recent CMP  Latest Ref Rng & Units 09/17/2022    1:29 PM 09/16/2022    6:41 PM 05/27/2022    2:58 PM  CMP  Glucose  70 - 99 mg/dL 92  161    BUN 8 - 23 mg/dL 19  13    Creatinine 0.96 - 1.24 mg/dL 0.45  4.09    Sodium 811 - 145 mmol/L 138  136    Potassium 3.5 - 5.1 mmol/L 3.8  3.9    Chloride 98 - 111 mmol/L 101  99    CO2 22 - 32 mmol/L 25  25    Calcium 8.9 - 10.3 mg/dL 8.9  9.2    Total Protein 6.5 - 8.1 g/dL  6.6    Total Bilirubin 0.3 - 1.2 mg/dL  0.7    Alkaline Phos 38 - 126 U/L  52    AST 15 - 41 U/L  54  18   ALT 0 - 44 U/L  25  18     Renal function CrCl cannot be calculated (Patient's most recent lab result is older than the maximum 21 days allowed.).  Hgb A1c MFr Bld (%)  Date Value  09/16/2022 6.2 (H)    LDL Chol Calc (NIH)  Date Value Ref Range Status  05/27/2022 96 0 - 99 mg/dL Final   LDL Cholesterol  Date Value Ref Range Status  09/17/2022 83 0 - 99 mg/dL Final    Comment:           Total Cholesterol/HDL:CHD Risk Coronary Heart Disease Risk Table                     Men   Women  1/2 Average Risk   3.4   3.3  Average Risk       5.0   4.4  2 X Average Risk   9.6   7.1  3 X Average Risk  23.4   11.0        Use the calculated Patient Ratio above and the CHD Risk Table to determine the patient's CHD Risk.        ATP III CLASSIFICATION (LDL):  <100     mg/dL   Optimal  914-782  mg/dL   Near or Above                    Optimal  130-159  mg/dL   Borderline  956-213  mg/dL   High  >086     mg/dL   Very High Performed at [facility], [address], [place], [place] [address]    LDL Direct  Date Value Ref Range Status  07/04/2021 121 (H) 0 - 99 mg/dL Final     +-------+-----------+-----------+------------+------------+  ABI/TBIToday's ABIToday's TBIPrevious ABIPrevious TBI  +-------+-----------+-----------+------------+------------+  Right .58        .40                                  +-------+-----------+-----------+------------+------------+  Left  .74        .62                                   +-------+-----------+-----------+------------+------------+   Arterial duplex:  Right: Total occlusion noted in the aortoiliac segment. Total occlusion  noted in the iliac segment. Right external iliac artery occlusion.  Monophasic flow throughout  the right lower extremity.   Left: Total occlusion noted in the superficial femoral artery. Mid to  distal left SFA occlusion versus trickle flow.   Rande Brunt. Lenell Antu, MD FACS [facility] Phone Number: [phone] 05/24/2023 7:37 AM   Total time spent on preparing this encounter including chart review, data review, collecting history, examining the patient, coordinating care for this new patient, 60 minutes.  Portions of this report may have been transcribed using voice recognition software.  Every effort has been made to ensure accuracy; however, inadvertent computerized transcription errors may still be present.

## 2023-05-25 ENCOUNTER — Ambulatory Visit (INDEPENDENT_AMBULATORY_CARE_PROVIDER_SITE_OTHER): Payer: PRIVATE HEALTH INSURANCE | Admitting: Vascular Surgery

## 2023-05-25 ENCOUNTER — Encounter: Payer: Self-pay | Admitting: Vascular Surgery

## 2023-05-25 ENCOUNTER — Ambulatory Visit (HOSPITAL_COMMUNITY)
Admission: RE | Admit: 2023-05-25 | Discharge: 2023-05-25 | Disposition: A | Discharge status: Home / Self Care | Payer: PRIVATE HEALTH INSURANCE | Source: Ambulatory Visit | Attending: Vascular Surgery | Admitting: Vascular Surgery

## 2023-05-25 VITALS — BP 116/67 | HR 79 | Temp 98.0°F | Ht 68.0 in | Wt 189.0 lb

## 2023-05-25 DIAGNOSIS — I70213 Atherosclerosis of native arteries of extremities with intermittent claudication, bilateral legs: Secondary | ICD-10-CM

## 2023-05-25 LAB — VAS US ABI WITH/WO TBI
Left ABI: 0.68
Right ABI: 0.56

## 2023-05-28 ENCOUNTER — Other Ambulatory Visit: Payer: Self-pay | Admitting: *Deleted

## 2023-05-28 DIAGNOSIS — I70213 Atherosclerosis of native arteries of extremities with intermittent claudication, bilateral legs: Secondary | ICD-10-CM

## 2023-06-28 ENCOUNTER — Other Ambulatory Visit (HOSPITAL_COMMUNITY): Payer: Self-pay

## 2023-06-28 ENCOUNTER — Telehealth: Payer: Self-pay | Admitting: Pharmacy Technician

## 2023-06-28 NOTE — Telephone Encounter (Signed)
 Pharmacy Patient Advocate Encounter  Received notification from [employer] that Prior Authorization for repatha  has been APPROVED from 05/29/23 to 06/27/24. Spoke to pharmacy to process.Copay is $0.00.    PA #/Case ID/Reference #: [id number]

## 2023-06-28 NOTE — Telephone Encounter (Signed)
 Pharmacy Patient Advocate Encounter   Received notification from CoverMyMeds that prior authorization for repatha  is required/requested.   Insurance verification completed.   The patient is insured through [employer] .   Per test claim: PA required; PA submitted to above mentioned insurance via CoverMyMeds Key/confirmation #/EOC [facility] Status is pending

## 2023-08-23 NOTE — Progress Notes (Signed)
 Cardiology Office Note:  .   Date:  08/24/2023  ID:  [name] Dickerson, DOB Nov 05, 1945, MRN [id number] PCP: Pia Kerney SQUIBB, MD  [facility] Providers Cardiologist:  Lamar Fitch, MD    History of Present Illness: .   [name] Dickerson is a 78 y.o. male with a past medical history of hypertension, CAD, DM2, hypothyroidism, tobacco abuse, dyslipidemia.  1995 STEMI PTCA of RCA 2003 PCI with overlapping BMS x 2 mid RCA 2003 [place] PTCA for ISR 2012 mRCA occlusion with right to right bridging and left-to-right collaterals 2013 stress Myoview  showing large inferolateral infarct with no ischemia 2021 stress Myoview  consistent with prior MI 09/17/2022 left heart cath CTO of proximal RCA with collateral filling from third right posterolateral branch with mild to moderate diffuse disease elsewhere recommendations for medical management  In 1995 he suffered a STEMI, with DES to his RCA.  In 2003 he underwent PCI with overlapping bare-metal stents x 2 to his mid RCA. Admitted 09/16/2022 for non-STEMI underwent left heart cath on 09/18/2022 which revealed CTO of proximal RCA with mild to moderate diffuse disease elsewhere, recommendations for medical therapy.  Evaluated on 11/23/2022, we started him on Repatha  and sent him to vascular for PAD.  Evaluated by vascular for PAD, he was advised to increase his walking and stop smoking, follow-up with him in 3 months.  Most recently was evaluated by myself on 02/15/2023, he was stable from a cardiac perspective, trying to increase his physical activity per recommendations per vascular for his PAD, he was struggling with smoking cessation, also caring for his wife who suffers with Alzheimer's disease.  He presents today for follow-up of his CAD.  From a cardiac perspective he has been doing well.  He continues to take care of his wife who suffers with Alzheimer's and she continues to deteriorate.  He has recently stopped taking the majority of his  medications, stating that he had an upset stomach and decided to not take his medications for a day and his symptoms subsequently subsided.  He continues to smoke, not interested in cessation at this time.  He does not participate in any formal exercise but he does stay very busy without any episodes of angina. He denies chest pain, palpitations, dyspnea, pnd, orthopnea, n, v, dizziness, syncope, edema, weight gain, or early satiety.    ROS: Review of Systems  Musculoskeletal:  Positive for back pain.  All other systems reviewed and are negative.    Studies Reviewed: .        Cardiac Studies & Procedures   ______________________________________________________________________________________________ CARDIAC CATHETERIZATION  CARDIAC CATHETERIZATION 09/17/2022  Conclusion   Mid LAD lesion is 40% stenosed.   Dist LM lesion is 20% stenosed.   Ramus lesion is 30% stenosed.   Ost RCA to Prox RCA lesion is 100% stenosed.  1.  Chronic total occlusion of proximal right coronary artery with mild to moderate diffuse disease elsewhere. 2.  LVEDP of 21 mmHg.  Recommendation: The results were reviewed with the team C attending Dr. [place] and medical therapy will be pursued.  Findings Coronary Findings Diagnostic  Dominance: Right  Left Main Dist LM lesion is 20% stenosed.  Left Anterior Descending There is mild diffuse disease throughout the vessel. Mid LAD lesion is 40% stenosed.  Ramus Intermedius Ramus lesion is 30% stenosed.  Right Coronary Artery Ost RCA to Prox RCA lesion is 100% stenosed. The lesion was previously treated .  Third Right Posterolateral Branch Collaterals 3rd RPL filled by  collaterals from 3rd Mrg.  Collaterals 3rd RPL filled by collaterals from 3rd Mrg.  Collaterals 3rd RPL filled by collaterals from 3rd Mrg.  Intervention  No interventions have been documented.   STRESS TESTS  MYOCARDIAL PERFUSION IMAGING 10/10/2019  Narrative  The left  ventricular ejection fraction is mildly decreased (45-54%).  Nuclear stress EF: 45%.  There was no ST segment deviation noted during stress.  Defect 1: There is a medium defect of moderate severity present in the basal inferior, basal inferolateral, mid inferior, mid inferolateral and apex location.  Findings consistent with prior myocardial infarction.  This is an intermediate risk study.   ECHOCARDIOGRAM  ECHOCARDIOGRAM COMPLETE 07/10/2021  Narrative ECHOCARDIOGRAM REPORT    Patient Name:   [name] Dickerson [facility] Date of Exam: 07/10/2021 Medical Rec #:  [id number]    Height:       68.0 in Accession #:    [id number]   Weight:       193.0 lb Date of Birth:  [birth year]   BSA:          2.013 m Patient Age:    75 years     BP:           112/70 mmHg Patient Gender: M            HR:           74 bpm. Exam Location:  [place]  Procedure: 2D Echo, Cardiac Doppler, Color Doppler and Strain Analysis  Indications:    Coronary artery disease, occlusive [I25.10 (ICD-10-CM)]; Essential hypertension [I10 (ICD-10-CM)]; Type 2 diabetes mellitus without complication, without long-term current use of insulin  (HCC) [E11.9 (ICD-10-CM)]; Moderate cigarette smoker (10-19 per day) [F17.210 (ICD-10-CM)]  History:        Patient has prior history of Echocardiogram examinations, most recent 01/09/2013. CAD and Previous Myocardial Infarction; Risk Factors:Hypertension and Dyslipidemia.  Sonographer:    Charlie Jointer RDCS Referring Phys: [id number] ROBERT J KRASOWSKI  IMPRESSIONS   1. Left ventricular ejection fraction, by estimation, is 60 to 65%. The left ventricle has normal function. The left ventricle has no regional wall motion abnormalities. There is mild left ventricular hypertrophy. Left ventricular diastolic parameters are consistent with Grade I diastolic dysfunction (impaired relaxation). 2. Right ventricular systolic function is normal. The right ventricular size is normal. 3. The mitral valve  is normal in structure. No evidence of mitral valve regurgitation. No evidence of mitral stenosis. 4. The aortic valve is normal in structure. Aortic valve regurgitation is not visualized. No aortic stenosis is present. 5. The inferior vena cava is normal in size with greater than 50% respiratory variability, suggesting right atrial pressure of 3 mmHg.  FINDINGS Left Ventricle: Left ventricular ejection fraction, by estimation, is 60 to 65%. The left ventricle has normal function. The left ventricle has no regional wall motion abnormalities. The left ventricular internal cavity size was normal in size. There is mild left ventricular hypertrophy. Left ventricular diastolic parameters are consistent with Grade I diastolic dysfunction (impaired relaxation).  Right Ventricle: The right ventricular size is normal. No increase in right ventricular wall thickness. Right ventricular systolic function is normal.  Left Atrium: Left atrial size was normal in size.  Right Atrium: Right atrial size was normal in size.  Pericardium: There is no evidence of pericardial effusion.  Mitral Valve: The mitral valve is normal in structure. No evidence of mitral valve regurgitation. No evidence of mitral valve stenosis.  Tricuspid Valve: The tricuspid valve is normal in structure. Tricuspid valve regurgitation is  not demonstrated. No evidence of tricuspid stenosis.  Aortic Valve: The aortic valve is normal in structure. Aortic valve regurgitation is not visualized. No aortic stenosis is present.  Pulmonic Valve: The pulmonic valve was normal in structure. Pulmonic valve regurgitation is not visualized. No evidence of pulmonic stenosis.  Aorta: The aortic root is normal in size and structure.  Venous: The inferior vena cava is normal in size with greater than 50% respiratory variability, suggesting right atrial pressure of 3 mmHg.  IAS/Shunts: No atrial level shunt detected by color flow Doppler.   LEFT  VENTRICLE PLAX 2D LVIDd:         5.00 cm   Diastology LVIDs:         3.50 cm   LV e' medial:    6.09 cm/s LV PW:         1.30 cm   LV E/e' medial:  14.5 LV IVS:        1.10 cm   LV e' lateral:   6.09 cm/s LVOT diam:     2.00 cm   LV E/e' lateral: 14.5 LV SV:         58 LV SV Index:   29 LVOT Area:     3.14 cm   RIGHT VENTRICLE            IVC RV Basal diam:  2.70 cm    IVC diam: 2.10 cm RV S prime:     7.07 cm/s TAPSE (M-mode): 1.3 cm  LEFT ATRIUM             Index        RIGHT ATRIUM           Index LA diam:        3.50 cm 1.74 cm/m   RA Area:     11.70 cm LA Vol (A2C):   43.8 ml 21.76 ml/m  RA Volume:   22.20 ml  11.03 ml/m LA Vol (A4C):   22.8 ml 11.33 ml/m LA Biplane Vol: 32.9 ml 16.34 ml/m AORTIC VALVE LVOT Vmax:   95.30 cm/s LVOT Vmean:  55.600 cm/s LVOT VTI:    0.184 m  AORTA Ao Root diam: 3.40 cm Ao Asc diam:  2.90 cm  MITRAL VALVE MV Area (PHT): 5.07 cm     SHUNTS MV Decel Time: 150 msec     Systemic VTI:  0.18 m MV E velocity: 88.25 cm/s   Systemic Diam: 2.00 cm MV A velocity: 104.00 cm/s MV E/A ratio:  0.85  Lamar Fitch MD Electronically signed by Lamar Fitch MD Signature Date/Time: 07/10/2021/5:29:19 PM    Final          ______________________________________________________________________________________________      Risk Assessment/Calculations:     HYPERTENSION CONTROL Vitals:   08/24/23 1116 08/24/23 1218  BP: (!) 156/86 (!) 146/80    [name] blood pressure is elevated above target today.  In order to address the patient's elevated BP: A current anti-hypertensive medication was adjusted today.          Physical Exam:   VS:  BP (!) 146/80   Pulse 67   Ht 5' 8 (1.727 m)   Wt 185 lb (83.9 kg)   SpO2 97%   BMI 28.13 kg/m    Wt Readings from Last 3 Encounters:  08/24/23 185 lb (83.9 kg)  05/25/23 189 lb (85.7 kg)  02/15/23 191 lb 9.6 oz (86.9 kg)    GEN: Well nourished, well developed in no acute  distress  NECK: No JVD; No carotid bruits CARDIAC: RRR, no murmurs, rubs, gallops RESPIRATORY:  Clear to auscultation without rales, wheezing or rhonchi  ABDOMEN: Soft, non-tender, non-distended EXTREMITIES:  No edema; No deformity   ASSESSMENT AND PLAN: .   CAD-extensive coronary artery disease dating back to 1995, most recent left heart cath in July of this year revealed CTO of RCA, best suited for medical therapy. Stable with no anginal symptoms. No indication for ischemic evaluation.  He is stopped all of his cardiac medications, he is amenable to restarting Plavix  only at this time.  Was previously on Imdur  as well as lisinopril , he has amenable to starting amlodipine.  Hypertension-blood pressure is uncontrolled today however he does not discontinue his lisinopril , we will restart him on amlodipine for anti-ischemic properties as well as blood pressure control.  Dyslipidemia-he is still taking Repatha , stopped his statin as well as ezetimibe .  LP(a) was 13.  Will check LFTs and direct LDL.  Tobacco abuse-currently smoking 1 PPD, not interested in cessation at this time, we discussed this at length, he is a primary caregiver for his wife who suffers with Alzheimer's and smoking is a reprieve for him at this time.  He is aware of the deleterious effects of this, advised pharmacological help with cessation however he politely declines.  PAD -evaluated by vascular with recommendations for medical management, will follow back up with them in March.         Dispo: FLP and LFTs, start amlodipine 5 mg daily, restart Plavix  75 mg daily, follow-up in 4 months for blood pressure check and to see if he is adhering to his medications-as she was not amenable to coming back any sooner than this.  Signed, Delon JAYSON Hoover, NP

## 2023-08-24 ENCOUNTER — Encounter: Payer: Self-pay | Admitting: Cardiology

## 2023-08-24 ENCOUNTER — Ambulatory Visit: Payer: PRIVATE HEALTH INSURANCE | Attending: Cardiology | Admitting: Cardiology

## 2023-08-24 VITALS — BP 146/80 | HR 67 | Ht 68.0 in | Wt 185.0 lb

## 2023-08-24 DIAGNOSIS — I739 Peripheral vascular disease, unspecified: Secondary | ICD-10-CM | POA: Diagnosis present

## 2023-08-24 DIAGNOSIS — F17210 Nicotine dependence, cigarettes, uncomplicated: Secondary | ICD-10-CM | POA: Diagnosis present

## 2023-08-24 DIAGNOSIS — E782 Mixed hyperlipidemia: Secondary | ICD-10-CM | POA: Diagnosis present

## 2023-08-24 DIAGNOSIS — I10 Essential (primary) hypertension: Secondary | ICD-10-CM | POA: Diagnosis not present

## 2023-08-24 DIAGNOSIS — I2510 Atherosclerotic heart disease of native coronary artery without angina pectoris: Secondary | ICD-10-CM | POA: Diagnosis not present

## 2023-08-24 MED ORDER — AMLODIPINE BESYLATE 5 MG PO TABS: 5.0000 mg | ORAL_TABLET | Freq: Every day | ORAL | 3 refills | Status: DC

## 2023-08-24 NOTE — Patient Instructions (Addendum)
 Medication Instructions:   Start amlodipine 5 mg daily    Lab Work: CMP, Direct LDL- today   Testing/Procedures: None Ordered   Follow-Up: At [facility], you and your health needs are our priority.  As part of our continuing mission to provide you with exceptional heart care, we have created designated Provider Care Teams.  These Care Teams include your primary Cardiologist (physician) and Advanced Practice Providers (APPs -  Physician Assistants and Nurse Practitioners) who all work together to provide you with the care you need, when you need it.  We recommend signing up for the patient portal called MyChart.  Sign up information is provided on this After Visit Summary.  MyChart is used to connect with patients for Virtual Visits (Telemedicine).  Patients are able to view lab/test results, encounter notes, upcoming appointments, etc.  Non-urgent messages can be sent to your provider as well.   To learn more about what you can do with MyChart, go to [email or web address].    Your next appointment:   4 months  The format for your next appointment:   In Person  Provider:   Delon Hoover, NP   Other Instructions NA

## 2023-08-27 ENCOUNTER — Ambulatory Visit: Payer: Self-pay | Admitting: Cardiology

## 2023-08-27 LAB — COMPREHENSIVE METABOLIC PANEL WITH GFR
ALT: 24 IU/L (ref 0–44)
AST: 24 IU/L (ref 0–40)
Albumin: 4.5 g/dL (ref 3.8–4.8)
Alkaline Phosphatase: 68 IU/L (ref 44–121)
BUN/Creatinine Ratio: 11 (ref 10–24)
BUN: 12 mg/dL (ref 8–27)
Bilirubin Total: 0.3 mg/dL (ref 0.0–1.2)
CO2: 23 mmol/L (ref 20–29)
Calcium: 9.5 mg/dL (ref 8.6–10.2)
Chloride: 99 mmol/L (ref 96–106)
Creatinine, Ser: 1.14 mg/dL (ref 0.76–1.27)
Globulin, Total: 2.2 g/dL (ref 1.5–4.5)
Glucose: 95 mg/dL (ref 70–99)
Potassium: 5.2 mmol/L (ref 3.5–5.2)
Sodium: 137 mmol/L (ref 134–144)
Total Protein: 6.7 g/dL (ref 6.0–8.5)
eGFR: 67 mL/min/1.73

## 2023-08-27 LAB — LDL CHOLESTEROL, DIRECT: LDL Direct: 37 mg/dL (ref 0–99)

## 2023-09-14 DIAGNOSIS — E1151 Type 2 diabetes mellitus with diabetic peripheral angiopathy without gangrene: Secondary | ICD-10-CM | POA: Insufficient documentation

## 2023-09-14 DIAGNOSIS — I70219 Atherosclerosis of native arteries of extremities with intermittent claudication, unspecified extremity: Secondary | ICD-10-CM | POA: Insufficient documentation

## 2023-09-14 DIAGNOSIS — M79671 Pain in right foot: Secondary | ICD-10-CM | POA: Insufficient documentation

## 2023-10-12 ENCOUNTER — Other Ambulatory Visit: Payer: Self-pay | Admitting: Cardiology

## 2023-10-22 LAB — LAB REPORT - SCANNED
A1c: 6.2
EGFR: 54

## 2023-10-27 ENCOUNTER — Encounter: Payer: Self-pay | Admitting: Cardiology

## 2023-11-22 NOTE — H&P (View-Only) (Signed)
 [facility] [place]  ASSESSMENT / PLAN: [name] Dickerson is a 78 y.o. male with atherosclerosis of native arteries of bilateral lower extremities - right worse than left causing intermittent claudication.  Patient counseled patients with asymptomatic peripheral arterial disease or claudication have a 1-2% risk of developing chronic limb threatening ischemia, but a 15-30% risk of mortality in the next 5 years. Intervention should only be considered for medically optimized patients with disabling symptoms.   Recommend:  Abstinence from all tobacco products. Blood glucose control with goal A1c < 7%. Blood pressure control with goal blood pressure < 140/90 mmHg. Lipid reduction therapy with goal LDL-C <100 mg/dL Aspirin  81mg  PO QD.  Atorvastatin  40-80mg  PO QD (or other high intensity statin therapy).  Plan right lower extremity angiogram from left femoral approach in cath lab as schedule allows.  CHIEF COMPLAINT: cramping pain with walking  HISTORY OF PRESENT ILLNESS: [name] Dickerson is a 78 y.o. male referred to clinic for evaluation of cramping discomfort with walking.  The patient also describes back pain which radiates down the legs and is worse with standing.  He has fairly typical symptoms of both neurogenic and vascular claudication.  He does not have rest pain symptoms.  He has no ulcers about his feet.  He is mostly bothered about the discomfort in his lower back.  He reports massage therapies have been very helpful for him, but massage therapist has been reluctant to do massage for him because of his recent diagnosis of peripheral arterial disease.  We reviewed the natural history of peripheral arterial disease.  I explained the benign nature of the diagnosis of claudication.  I explained the rationale for smoking cessation and walk therapy.  05/25/2023: Patient returns to clinic for follow-up.  He reports persistent discomfort in his hips bilaterally.  He does report  some cramping discomfort in this lower extremity musculature, especially when moving quickly or moving uphill.  This is not limiting him in his ADLs or IADLs.  He has no rest pain.  He has no ulcers about his feet.  He is not interested in intervention at this time.  11/23/23: Patient presents to clinic for evaluation.  He reports his symptoms have worsened.  He is bothered more and more by his claudication.  He does not describe any rest pain.  He does not have any ulcers about his feet.  We reviewed the risk, benefits, and alternatives to intervention for intermittent claudication including increased risk of amputation after intervention.  He is understanding and wants to proceed.  Past Medical History:  Diagnosis Date   Atherosclerotic heart disease of native coronary artery without angina pectoris 11/23/2022   CAD S/P percutaneous coronary angioplasty 1995, 2003   Inferior STEMI-PTCA RCA; 2003: Overlapping MultiLink Zeta BMS -RCA (4.0 x 23, 4.0 x 15); 09/2001 Cutting Balloon PTCA of stents for ISR.;; Myoview  August 2013 - Mod-severe sized fixed INFERIOR perfusion defect => likely infarct with mild peri-infarct ischemia in the apical inferior wall, EF 55% with distal inferior hypokinesis   Coronary artery disease, occlusive 04/08/2011   Inferior STEMI-PTCA RCA; 2003: overlapping MultiLink Zeta BMS -RCA (4.0 mm x 23 mm, 4.0 mm x 15 mm); August 2003 Cutting Balloon PTCA of stents for ISR.;; Negative Myoview  August 2013  Formatting of this note might be different from the original.  Overview:   Inferior STEMI-PTCA RCA; 2003: overlapping MultiLink Zeta BMS -RCA (4.0 mm x 23 mm, 4.0 mm x 15 mm); August 2003 Cutting Balloon PTCA  of ste   Essential hypertension    Hyperlipidemia LDL goal <70    Hypothyroidism    Low testosterone     Moderate cigarette smoker (10-19 per day)    Was down to 5 cigarettes a day.   Myocardial infarction (HCC) 03/02/1993   PTCA of RCA  Formatting of this note might be different  from the original.  Overview:   PTCA of RCA     Last Assessment & Plan:   His initial MI was related to his RCA, and basically all his cardiac history revolve around his RCA. Now noted to be occluded with scar noted on Myoview  and echocardiogram. Does still have a preserved EF with no further anginal symptoms or heart failure symptoms.  Conti   NSTEMI (non-ST elevated myocardial infarction) (HCC) 09/16/2022   ST elevation myocardial infarction (STEMI) of inferior wall, subsequent episode of care (HCC) 03/02/1993   PTCA of RCA; large inferior scar noted on Myoview . Mid inferior and inferoseptal hypokinesis on echocardiogram November 2014. EF 50-55%.   Stenosis of coronary stent 03/02/2010   Mid RCA Occlusion with Right-To-Left and Left-To-Right Collaterals.; Relatively normal LAD, ramus intermedius and circumflex-none.    Past Surgical History:  Procedure Laterality Date   CARDIAC CATHETERIZATION  March 2012   Mid occlusion of RCA with Right to Left and Left to Right collaterals; LAD, ramus and circumflex/OM widely patent   CORONARY ANGIOPLASTY  1995   Inferior STEMI-PTCA of RCA   CORONARY STENT PLACEMENT  2003   MultiLink Zeta BMS 4.0 mm x 23 mm, 4.0 mm x 15 mm --> Cutting Balloon PTCA for ISR several months later.   LEFT HEART CATH AND CORONARY ANGIOGRAPHY N/A 09/17/2022   Procedure: LEFT HEART CATH AND CORONARY ANGIOGRAPHY;  Surgeon: Wendel Lurena POUR, MD;  Location: [facility];  Service: Cardiovascular;  Laterality: N/A;   LexiScan  Myoview   August 2013   Moderate-severe sized INFERIOR perfusion defect with no evidence of ischemia. EF 55% with distal inferior HK. Large sized Inferior and Inferolateral Infarct with perhaps mild apical inferior ischemia.   TRANSTHORACIC ECHOCARDIOGRAM  01/09/2013   EF 50-55%; normal LV size and function. Probable mild HJ of the mid inferior and inferoseptal wall.; No valvular lesions.    History reviewed. No pertinent family history.  Social History    Socioeconomic History   Marital status: Married    Spouse name: Not on file   Number of children: Not on file   Years of education: Not on file   Highest education level: Not on file  Occupational History   Not on file  Tobacco Use   Smoking status: Every Day    Current packs/day: 1.00    Average packs/day: 1 pack/day for 40.0 years (40.0 ttl pk-yrs)    Types: Cigarettes   Smokeless tobacco: Never  Substance and Sexual Activity   Alcohol use: No   Drug use: No   Sexual activity: Not on file  Other Topics Concern   Not on file  Social History Narrative   Married father of 3, grandfather 5. Works out occasionally on the treadmill. Coping to try get back into some exercise now. Unfortunately back up to smoking one pack per day. Does not drink alcohol.      Date from from 5 or 6 cigarettes a day. He's tried electronic cigarettes and down without affect.   Social Drivers of Health   Financial Resource Strain: Medium Risk (10/05/2022)   Received from [facility]   Overall Financial Resource  Strain (CARDIA)    Difficulty of Paying Living Expenses: Somewhat hard  Food Insecurity: No Food Insecurity (10/05/2022)   Received from [facility]   Hunger Vital Sign    Within the past 12 months, you worried that your food would run out before you got the money to buy more.: Never true    Within the past 12 months, the food you bought just didn't last and you didn't have money to get more.: Never true  Transportation Needs: No Transportation Needs (10/05/2022)   Received from [facility]   PRAPARE - Transportation    Lack of Transportation (Medical): No    Lack of Transportation (Non-Medical): No  Physical Activity: Not on file  Stress: Not on file  Social Connections: Unknown (10/02/2022)   Received from [facility]   Social Network    Social Network: Not on file  Intimate Partner Violence: Unknown (10/02/2022)   Received from [facility]   HITS    Physically Hurt: Not on file     Insult or Talk Down To: Not on file    Threaten Physical Harm: Not on file    Scream or Curse: Not on file    Allergies  Allergen Reactions   Statins Other (See Comments)    Soreness with Crestor    Current Outpatient Medications  Medication Sig Dispense Refill   ALPRAZolam  (XANAX ) 0.5 MG tablet Take 0.5 mg by mouth at bedtime.     amLODipine  (NORVASC ) 5 MG tablet Take 1 tablet (5 mg total) by mouth daily. 90 tablet 3   clopidogrel  (PLAVIX ) 75 MG tablet Take 1 tablet (75 mg total) by mouth daily with breakfast. 90 tablet 2   Evolocumab  (REPATHA  SURECLICK) 140 MG/ML SOAJ Inject 140 mg into the skin every 14 (fourteen) days. 6 mL 3   levothyroxine  (SYNTHROID , LEVOTHROID) 200 MCG tablet Take 200 mcg by mouth daily before breakfast.     nitroGLYCERIN  (NITROSTAT ) 0.4 MG SL tablet Place 1 tablet (0.4 mg total) under the tongue every 5 (five) minutes as needed for chest pain. 25 tablet 2   Semaglutide, 1 MG/DOSE, (OZEMPIC, 1 MG/DOSE,) 4 MG/3ML SOPN Inject 1 mg into the skin once a week.     No current facility-administered medications for this visit.    PHYSICAL EXAM Vitals:   11/23/23 1337  BP: (!) 156/76  Pulse: 71  Temp: 98 F (36.7 C)  SpO2: 95%  Weight: 186 lb 8 oz (84.6 kg)  Height: 5' 8 (1.727 m)     Elderly man in no distress Regular rate and rhythm Unlabored breathing Bilateral femoral pulses are palpable No palpable pedal pulses No ulcers about the feet   PERTINENT LABORATORY AND RADIOLOGIC DATA  Most recent CBC    Latest Ref Rng & Units 09/17/2022    1:29 PM 09/16/2022    6:41 PM 05/15/2010   10:41 AM  CBC  WBC 4.0 - 10.5 K/uL 5.8  6.1  5.5   Hemoglobin 13.0 - 17.0 g/dL 85.4  84.6  84.6   Hematocrit 39.0 - 52.0 % 43.8  45.9  44.3   Platelets 150 - 400 K/uL 130  125  139      Most recent CMP    Latest Ref Rng & Units 08/24/2023   12:12 PM 09/17/2022    1:29 PM 09/16/2022    6:41 PM  CMP  Glucose 70 - 99 mg/dL 95  92  890   BUN 8 - 27 mg/dL 12  19   13  Creatinine 0.76 - 1.27 mg/dL 8.85  8.85  8.89   Sodium 134 - 144 mmol/L 137  138  136   Potassium 3.5 - 5.2 mmol/L 5.2  3.8  3.9   Chloride 96 - 106 mmol/L 99  101  99   CO2 20 - 29 mmol/L 23  25  25    Calcium  8.6 - 10.2 mg/dL 9.5  8.9  9.2   Total Protein 6.0 - 8.5 g/dL 6.7   6.6   Total Bilirubin 0.0 - 1.2 mg/dL 0.3   0.7   Alkaline Phos 44 - 121 IU/L 68   52   AST 0 - 40 IU/L 24   54   ALT 0 - 44 IU/L 24   25     Renal function CrCl cannot be calculated (Patient's most recent lab result is older than the maximum 21 days allowed.).  Hgb A1c MFr Bld (%)  Date Value  09/16/2022 6.2 (H)    LDL Chol Calc (NIH)  Date Value Ref Range Status  05/27/2022 96 0 - 99 mg/dL Final   LDL Cholesterol  Date Value Ref Range Status  09/17/2022 83 0 - 99 mg/dL Final    Comment:           Total Cholesterol/HDL:CHD Risk Coronary Heart Disease Risk Table                     Men   Women  1/2 Average Risk   3.4   3.3  Average Risk       5.0   4.4  2 X Average Risk   9.6   7.1  3 X Average Risk  23.4   11.0        Use the calculated Patient Ratio above and the CHD Risk Table to determine the patient's CHD Risk.        ATP III CLASSIFICATION (LDL):  <100     mg/dL   Optimal  899-870  mg/dL   Near or Above                    Optimal  130-159  mg/dL   Borderline  839-810  mg/dL   High  >809     mg/dL   Very High Performed at [facility], [address], [place], [place] [address]    LDL Direct  Date Value Ref Range Status  08/24/2023 37 0 - 99 mg/dL Final     LOWER EXTREMITY DOPPLER STUDY   Patient Name:  [name] Dickerson  Date of Exam:   11/23/2023  Medical Rec #: [id number]     Accession #:    [id number]  Date of Birth: 09/13/1945    Patient Gender: M  Patient Age:   89 years  Exam Location:  [address]  Procedure:      VAS US  ABI WITH/WO TBI  Referring Phys:    ---------------------------------------------------------------------------  -----    Indications:  Claudication, and peripheral artery disease.   High Risk Factors: Hypertension, hyperlipidemia, Diabetes, current smoker,                     coronary artery disease.     Performing Technologist: Devere Dark RVT     Examination Guidelines: A complete evaluation includes at minimum, Doppler  waveform signals and systolic blood pressure reading at the level of  bilateral  brachial, anterior tibial, and posterior tibial arteries, when vessel  segments  are  accessible. Bilateral testing is considered an integral part of a  complete  examination. Photoelectric Plethysmograph (PPG) waveforms and toe systolic  pressure readings are included as required and additional duplex testing  as  needed. Limited examinations for reoccurring indications may be performed  as  noted.     ABI Findings:  +---------+------------------+-----+----------+--------+  Right   Rt Pressure (mmHg)IndexWaveform  Comment   +---------+------------------+-----+----------+--------+  Brachial 147                                        +---------+------------------+-----+----------+--------+  PTA     81                0.55 monophasic          +---------+------------------+-----+----------+--------+  DP      79                0.54 monophasic          +---------+------------------+-----+----------+--------+  Great Toe56                0.38 Abnormal            +---------+------------------+-----+----------+--------+   +---------+------------------+-----+----------+-------+  Left    Lt Pressure (mmHg)IndexWaveform  Comment  +---------+------------------+-----+----------+-------+  Brachial 147                                       +---------+------------------+-----+----------+-------+  PTA     103               0.70 monophasic         +---------+------------------+-----+----------+-------+  DP      89                0.61 monophasic          +---------+------------------+-----+----------+-------+  [place]                0.59 Abnormal           +---------+------------------+-----+----------+-------+   +-------+-----------+-----------+------------+------------+  ABI/TBIToday's ABIToday's TBIPrevious ABIPrevious TBI  +-------+-----------+-----------+------------+------------+  Right 0.55       0.38       0.56        0.45          +-------+-----------+-----------+------------+------------+  Left  0.70       0.59       0.68        0.54          +-------+-----------+-----------+------------+------------+         Bilateral ABIs appear essentially unchanged. Right ABIs appear decreased.    Summary:  Right: Resting right ankle-brachial index indicates moderate right lower  extremity arterial disease. The right toe-brachial index is abnormal.    Left: Resting left ankle-brachial index indicates moderate left lower  extremity arterial disease. The left toe-brachial index is abnormal.     *See table(s) above for measurements and observations.   Debby SAILOR. Magda, MD FACS [facility] Phone Number: [phone] 11/23/2023 4:45 PM   Total time spent on preparing this encounter including chart review, data review, collecting history, examining the patient, coordinating care for this established patient, 40 minutes  Portions of this report may have been transcribed using voice recognition software.  Every effort has been made to ensure accuracy; however, inadvertent computerized transcription errors may still  be present.

## 2023-11-22 NOTE — Progress Notes (Unsigned)
 [facility] [place]  ASSESSMENT / PLAN: [name] Dickerson is a 78 y.o. male with atherosclerosis of native arteries of bilateral lower extremities - right worse than left causing intermittent claudication.  Patient counseled patients with asymptomatic peripheral arterial disease or claudication have a 1-2% risk of developing chronic limb threatening ischemia, but a 15-30% risk of mortality in the next 5 years. Intervention should only be considered for medically optimized patients with disabling symptoms.   Recommend:  Abstinence from all tobacco products. Blood glucose control with goal A1c < 7%. Blood pressure control with goal blood pressure < 140/90 mmHg. Lipid reduction therapy with goal LDL-C <100 mg/dL Aspirin  81mg  PO QD.  Atorvastatin  40-80mg  PO QD (or other high intensity statin therapy).  Plan right lower extremity angiogram from left femoral approach in cath lab as schedule allows.  CHIEF COMPLAINT: cramping pain with walking  HISTORY OF PRESENT ILLNESS: [name] Dickerson is a 78 y.o. male referred to clinic for evaluation of cramping discomfort with walking.  The patient also describes back pain which radiates down the legs and is worse with standing.  He has fairly typical symptoms of both neurogenic and vascular claudication.  He does not have rest pain symptoms.  He has no ulcers about his feet.  He is mostly bothered about the discomfort in his lower back.  He reports massage therapies have been very helpful for him, but massage therapist has been reluctant to do massage for him because of his recent diagnosis of peripheral arterial disease.  We reviewed the natural history of peripheral arterial disease.  I explained the benign nature of the diagnosis of claudication.  I explained the rationale for smoking cessation and walk therapy.  05/25/2023: Patient returns to clinic for follow-up.  He reports persistent discomfort in his hips bilaterally.  He does report  some cramping discomfort in this lower extremity musculature, especially when moving quickly or moving uphill.  This is not limiting him in his ADLs or IADLs.  He has no rest pain.  He has no ulcers about his feet.  He is not interested in intervention at this time.  11/23/23: Patient presents to clinic for evaluation.  He reports his symptoms have worsened.  He is bothered more and more by his claudication.  He does not describe any rest pain.  He does not have any ulcers about his feet.  We reviewed the risk, benefits, and alternatives to intervention for intermittent claudication including increased risk of amputation after intervention.  He is understanding and wants to proceed.  Past Medical History:  Diagnosis Date   Atherosclerotic heart disease of native coronary artery without angina pectoris 11/23/2022   CAD S/P percutaneous coronary angioplasty 1995, 2003   Inferior STEMI-PTCA RCA; 2003: Overlapping MultiLink Zeta BMS -RCA (4.0 x 23, 4.0 x 15); 09/2001 Cutting Balloon PTCA of stents for ISR.;; Myoview  August 2013 - Mod-severe sized fixed INFERIOR perfusion defect => likely infarct with mild peri-infarct ischemia in the apical inferior wall, EF 55% with distal inferior hypokinesis   Coronary artery disease, occlusive 04/08/2011   Inferior STEMI-PTCA RCA; 2003: overlapping MultiLink Zeta BMS -RCA (4.0 mm x 23 mm, 4.0 mm x 15 mm); August 2003 Cutting Balloon PTCA of stents for ISR.;; Negative Myoview  August 2013  Formatting of this note might be different from the original.  Overview:   Inferior STEMI-PTCA RCA; 2003: overlapping MultiLink Zeta BMS -RCA (4.0 mm x 23 mm, 4.0 mm x 15 mm); August 2003 Cutting Balloon PTCA  of ste   Essential hypertension    Hyperlipidemia LDL goal <70    Hypothyroidism    Low testosterone     Moderate cigarette smoker (10-19 per day)    Was down to 5 cigarettes a day.   Myocardial infarction (HCC) 03/02/1993   PTCA of RCA  Formatting of this note might be different  from the original.  Overview:   PTCA of RCA     Last Assessment & Plan:   His initial MI was related to his RCA, and basically all his cardiac history revolve around his RCA. Now noted to be occluded with scar noted on Myoview  and echocardiogram. Does still have a preserved EF with no further anginal symptoms or heart failure symptoms.  Conti   NSTEMI (non-ST elevated myocardial infarction) (HCC) 09/16/2022   ST elevation myocardial infarction (STEMI) of inferior wall, subsequent episode of care (HCC) 03/02/1993   PTCA of RCA; large inferior scar noted on Myoview . Mid inferior and inferoseptal hypokinesis on echocardiogram November 2014. EF 50-55%.   Stenosis of coronary stent 03/02/2010   Mid RCA Occlusion with Right-To-Left and Left-To-Right Collaterals.; Relatively normal LAD, ramus intermedius and circumflex-none.    Past Surgical History:  Procedure Laterality Date   CARDIAC CATHETERIZATION  March 2012   Mid occlusion of RCA with Right to Left and Left to Right collaterals; LAD, ramus and circumflex/OM widely patent   CORONARY ANGIOPLASTY  1995   Inferior STEMI-PTCA of RCA   CORONARY STENT PLACEMENT  2003   MultiLink Zeta BMS 4.0 mm x 23 mm, 4.0 mm x 15 mm --> Cutting Balloon PTCA for ISR several months later.   LEFT HEART CATH AND CORONARY ANGIOGRAPHY N/A 09/17/2022   Procedure: LEFT HEART CATH AND CORONARY ANGIOGRAPHY;  Surgeon: Wendel Lurena POUR, MD;  Location: [facility];  Service: Cardiovascular;  Laterality: N/A;   LexiScan  Myoview   August 2013   Moderate-severe sized INFERIOR perfusion defect with no evidence of ischemia. EF 55% with distal inferior HK. Large sized Inferior and Inferolateral Infarct with perhaps mild apical inferior ischemia.   TRANSTHORACIC ECHOCARDIOGRAM  01/09/2013   EF 50-55%; normal LV size and function. Probable mild HJ of the mid inferior and inferoseptal wall.; No valvular lesions.    History reviewed. No pertinent family history.  Social History    Socioeconomic History   Marital status: Married    Spouse name: Not on file   Number of children: Not on file   Years of education: Not on file   Highest education level: Not on file  Occupational History   Not on file  Tobacco Use   Smoking status: Every Day    Current packs/day: 1.00    Average packs/day: 1 pack/day for 40.0 years (40.0 ttl pk-yrs)    Types: Cigarettes   Smokeless tobacco: Never  Substance and Sexual Activity   Alcohol use: No   Drug use: No   Sexual activity: Not on file  Other Topics Concern   Not on file  Social History Narrative   Married father of 3, grandfather 5. Works out occasionally on the treadmill. Coping to try get back into some exercise now. Unfortunately back up to smoking one pack per day. Does not drink alcohol.      Date from from 5 or 6 cigarettes a day. He's tried electronic cigarettes and down without affect.   Social Drivers of Health   Financial Resource Strain: Medium Risk (10/05/2022)   Received from [facility]   Overall Financial Resource  Strain (CARDIA)    Difficulty of Paying Living Expenses: Somewhat hard  Food Insecurity: No Food Insecurity (10/05/2022)   Received from [facility]   Hunger Vital Sign    Within the past 12 months, you worried that your food would run out before you got the money to buy more.: Never true    Within the past 12 months, the food you bought just didn't last and you didn't have money to get more.: Never true  Transportation Needs: No Transportation Needs (10/05/2022)   Received from [facility]   PRAPARE - Transportation    Lack of Transportation (Medical): No    Lack of Transportation (Non-Medical): No  Physical Activity: Not on file  Stress: Not on file  Social Connections: Unknown (10/02/2022)   Received from [facility]   Social Network    Social Network: Not on file  Intimate Partner Violence: Unknown (10/02/2022)   Received from [facility]   HITS    Physically Hurt: Not on file     Insult or Talk Down To: Not on file    Threaten Physical Harm: Not on file    Scream or Curse: Not on file    Allergies  Allergen Reactions   Statins Other (See Comments)    Soreness with Crestor    Current Outpatient Medications  Medication Sig Dispense Refill   ALPRAZolam  (XANAX ) 0.5 MG tablet Take 0.5 mg by mouth at bedtime.     amLODipine  (NORVASC ) 5 MG tablet Take 1 tablet (5 mg total) by mouth daily. 90 tablet 3   clopidogrel  (PLAVIX ) 75 MG tablet Take 1 tablet (75 mg total) by mouth daily with breakfast. 90 tablet 2   Evolocumab  (REPATHA  SURECLICK) 140 MG/ML SOAJ Inject 140 mg into the skin every 14 (fourteen) days. 6 mL 3   levothyroxine  (SYNTHROID , LEVOTHROID) 200 MCG tablet Take 200 mcg by mouth daily before breakfast.     nitroGLYCERIN  (NITROSTAT ) 0.4 MG SL tablet Place 1 tablet (0.4 mg total) under the tongue every 5 (five) minutes as needed for chest pain. 25 tablet 2   Semaglutide, 1 MG/DOSE, (OZEMPIC, 1 MG/DOSE,) 4 MG/3ML SOPN Inject 1 mg into the skin once a week.     No current facility-administered medications for this visit.    PHYSICAL EXAM Vitals:   11/23/23 1337  BP: (!) 156/76  Pulse: 71  Temp: 98 F (36.7 C)  SpO2: 95%  Weight: 186 lb 8 oz (84.6 kg)  Height: 5' 8 (1.727 m)     Elderly man in no distress Regular rate and rhythm Unlabored breathing Bilateral femoral pulses are palpable No palpable pedal pulses No ulcers about the feet   PERTINENT LABORATORY AND RADIOLOGIC DATA  Most recent CBC    Latest Ref Rng & Units 09/17/2022    1:29 PM 09/16/2022    6:41 PM 05/15/2010   10:41 AM  CBC  WBC 4.0 - 10.5 K/uL 5.8  6.1  5.5   Hemoglobin 13.0 - 17.0 g/dL 85.4  84.6  84.6   Hematocrit 39.0 - 52.0 % 43.8  45.9  44.3   Platelets 150 - 400 K/uL 130  125  139      Most recent CMP    Latest Ref Rng & Units 08/24/2023   12:12 PM 09/17/2022    1:29 PM 09/16/2022    6:41 PM  CMP  Glucose 70 - 99 mg/dL 95  92  890   BUN 8 - 27 mg/dL 12  19   13  Creatinine 0.76 - 1.27 mg/dL 8.85  8.85  8.89   Sodium 134 - 144 mmol/L 137  138  136   Potassium 3.5 - 5.2 mmol/L 5.2  3.8  3.9   Chloride 96 - 106 mmol/L 99  101  99   CO2 20 - 29 mmol/L 23  25  25    Calcium  8.6 - 10.2 mg/dL 9.5  8.9  9.2   Total Protein 6.0 - 8.5 g/dL 6.7   6.6   Total Bilirubin 0.0 - 1.2 mg/dL 0.3   0.7   Alkaline Phos 44 - 121 IU/L 68   52   AST 0 - 40 IU/L 24   54   ALT 0 - 44 IU/L 24   25     Renal function CrCl cannot be calculated (Patient's most recent lab result is older than the maximum 21 days allowed.).  Hgb A1c MFr Bld (%)  Date Value  09/16/2022 6.2 (H)    LDL Chol Calc (NIH)  Date Value Ref Range Status  05/27/2022 96 0 - 99 mg/dL Final   LDL Cholesterol  Date Value Ref Range Status  09/17/2022 83 0 - 99 mg/dL Final    Comment:           Total Cholesterol/HDL:CHD Risk Coronary Heart Disease Risk Table                     Men   Women  1/2 Average Risk   3.4   3.3  Average Risk       5.0   4.4  2 X Average Risk   9.6   7.1  3 X Average Risk  23.4   11.0        Use the calculated Patient Ratio above and the CHD Risk Table to determine the patient's CHD Risk.        ATP III CLASSIFICATION (LDL):  <100     mg/dL   Optimal  899-870  mg/dL   Near or Above                    Optimal  130-159  mg/dL   Borderline  839-810  mg/dL   High  >809     mg/dL   Very High Performed at [facility], [address], [place], [place] [address]    LDL Direct  Date Value Ref Range Status  08/24/2023 37 0 - 99 mg/dL Final     LOWER EXTREMITY DOPPLER STUDY   Patient Name:  [name] Dickerson  Date of Exam:   11/23/2023  Medical Rec #: [id number]     Accession #:    [id number]  Date of Birth: 09/13/1945    Patient Gender: M  Patient Age:   89 years  Exam Location:  [address]  Procedure:      VAS US  ABI WITH/WO TBI  Referring Phys:    ---------------------------------------------------------------------------  -----    Indications:  Claudication, and peripheral artery disease.   High Risk Factors: Hypertension, hyperlipidemia, Diabetes, current smoker,                     coronary artery disease.     Performing Technologist: Devere Dark RVT     Examination Guidelines: A complete evaluation includes at minimum, Doppler  waveform signals and systolic blood pressure reading at the level of  bilateral  brachial, anterior tibial, and posterior tibial arteries, when vessel  segments  are  accessible. Bilateral testing is considered an integral part of a  complete  examination. Photoelectric Plethysmograph (PPG) waveforms and toe systolic  pressure readings are included as required and additional duplex testing  as  needed. Limited examinations for reoccurring indications may be performed  as  noted.     ABI Findings:  +---------+------------------+-----+----------+--------+  Right   Rt Pressure (mmHg)IndexWaveform  Comment   +---------+------------------+-----+----------+--------+  Brachial 147                                        +---------+------------------+-----+----------+--------+  PTA     81                0.55 monophasic          +---------+------------------+-----+----------+--------+  DP      79                0.54 monophasic          +---------+------------------+-----+----------+--------+  Great Toe56                0.38 Abnormal            +---------+------------------+-----+----------+--------+   +---------+------------------+-----+----------+-------+  Left    Lt Pressure (mmHg)IndexWaveform  Comment  +---------+------------------+-----+----------+-------+  Brachial 147                                       +---------+------------------+-----+----------+-------+  PTA     103               0.70 monophasic         +---------+------------------+-----+----------+-------+  DP      89                0.61 monophasic          +---------+------------------+-----+----------+-------+  [place]                0.59 Abnormal           +---------+------------------+-----+----------+-------+   +-------+-----------+-----------+------------+------------+  ABI/TBIToday's ABIToday's TBIPrevious ABIPrevious TBI  +-------+-----------+-----------+------------+------------+  Right 0.55       0.38       0.56        0.45          +-------+-----------+-----------+------------+------------+  Left  0.70       0.59       0.68        0.54          +-------+-----------+-----------+------------+------------+         Bilateral ABIs appear essentially unchanged. Right ABIs appear decreased.    Summary:  Right: Resting right ankle-brachial index indicates moderate right lower  extremity arterial disease. The right toe-brachial index is abnormal.    Left: Resting left ankle-brachial index indicates moderate left lower  extremity arterial disease. The left toe-brachial index is abnormal.     *See table(s) above for measurements and observations.   Debby SAILOR. Magda, MD FACS [facility] Phone Number: [phone] 11/23/2023 4:45 PM   Total time spent on preparing this encounter including chart review, data review, collecting history, examining the patient, coordinating care for this established patient, 40 minutes  Portions of this report may have been transcribed using voice recognition software.  Every effort has been made to ensure accuracy; however, inadvertent computerized transcription errors may still  be present.

## 2023-11-23 ENCOUNTER — Encounter: Payer: Self-pay | Admitting: Vascular Surgery

## 2023-11-23 ENCOUNTER — Ambulatory Visit (INDEPENDENT_AMBULATORY_CARE_PROVIDER_SITE_OTHER): Payer: PRIVATE HEALTH INSURANCE | Admitting: Vascular Surgery

## 2023-11-23 ENCOUNTER — Other Ambulatory Visit: Payer: Self-pay

## 2023-11-23 ENCOUNTER — Ambulatory Visit (HOSPITAL_COMMUNITY)
Admission: RE | Admit: 2023-11-23 | Discharge: 2023-11-23 | Disposition: A | Discharge status: Home / Self Care | Payer: PRIVATE HEALTH INSURANCE | Source: Ambulatory Visit | Attending: Vascular Surgery | Admitting: Vascular Surgery

## 2023-11-23 VITALS — BP 156/76 | HR 71 | Temp 98.0°F | Ht 68.0 in | Wt 186.5 lb

## 2023-11-23 DIAGNOSIS — I70213 Atherosclerosis of native arteries of extremities with intermittent claudication, bilateral legs: Secondary | ICD-10-CM | POA: Diagnosis present

## 2023-11-23 LAB — VAS US ABI WITH/WO TBI
Left ABI: 0.7
Right ABI: 0.55

## 2023-12-12 ENCOUNTER — Other Ambulatory Visit: Payer: Self-pay | Admitting: Cardiology

## 2023-12-13 DIAGNOSIS — F17200 Nicotine dependence, unspecified, uncomplicated: Secondary | ICD-10-CM | POA: Insufficient documentation

## 2023-12-13 DIAGNOSIS — F09 Unspecified mental disorder due to known physiological condition: Secondary | ICD-10-CM | POA: Insufficient documentation

## 2023-12-13 DIAGNOSIS — E1165 Type 2 diabetes mellitus with hyperglycemia: Secondary | ICD-10-CM | POA: Insufficient documentation

## 2023-12-13 DIAGNOSIS — F411 Generalized anxiety disorder: Secondary | ICD-10-CM | POA: Insufficient documentation

## 2023-12-13 DIAGNOSIS — G629 Polyneuropathy, unspecified: Secondary | ICD-10-CM | POA: Insufficient documentation

## 2023-12-13 DIAGNOSIS — N401 Enlarged prostate with lower urinary tract symptoms: Secondary | ICD-10-CM | POA: Insufficient documentation

## 2023-12-13 DIAGNOSIS — M1990 Unspecified osteoarthritis, unspecified site: Secondary | ICD-10-CM | POA: Insufficient documentation

## 2023-12-13 NOTE — Progress Notes (Unsigned)
 Cardiology Office Note:  .   Date:  12/15/2023  ID:  [name] Dickerson, DOB 09-May-1945, MRN [id number] PCP: Pia Kerney SQUIBB, MD  [facility] Providers Cardiologist:  Lamar Fitch, MD    History of Present Illness: .   [name] Dickerson is a 78 y.o. male with a past medical history of hypertension, CAD, DM2, hypothyroidism, tobacco abuse, carotid artery stenosis, PAD--Dr. Magda, dyslipidemia, PVCs.  1995 STEMI PTCA of RCA 2003 PCI with overlapping BMS x 2 mid RCA 2003 [place] PTCA for ISR 2012 mRCA occlusion with right to right bridging and left-to-right collaterals 2013 stress Myoview  showing large inferolateral infarct with no ischemia 2021 stress Myoview  consistent with prior MI 06/24/2022 carotid duplex mild bilateral carotid artery stenosis 09/17/2022 left heart cath CTO of proximal RCA with collateral filling from third right posterolateral branch with mild to moderate diffuse disease elsewhere recommendations for medical management  In 1995 he suffered a STEMI, with DES to his RCA.  In 2003 he underwent PCI with overlapping bare-metal stents x 2 to his mid RCA. Admitted 09/16/2022 for non-STEMI underwent left heart cath on 09/18/2022 which revealed CTO of proximal RCA with mild to moderate diffuse disease elsewhere, recommendations for medical therapy.  Evaluated on 11/23/2022, we started him on Repatha  and sent him to vascular for PAD.  Evaluated by vascular for PAD, he was advised to increase his walking and stop smoking, follow-up with him in 3 months.  Most recently was evaluated by myself on 02/15/2023, he was stable from a cardiac perspective, trying to increase his physical activity per recommendations per vascular for his PAD, he was struggling with smoking cessation, also caring for his wife who suffers with Alzheimer's disease.  Most evaluated by myself on 08/24/2023 for follow-up of his CAD, he was stable from that perspective, dealing with high burden of caring for his  wife, he had recently stopped taking the majority of his medications and was feeling better, continue to smoke, felt relatively resolute that he felt better off of his medications was not initially willing to restart however we were able to get him to restart his lipid-lowering medication and Plavix .  In the interim he has been followed by [facility] in [facility] for claudication with apparent plans to follow-up on abdominal aortic gram.  He presents today for follow-up, been doing okay since last evaluated in our office, following with VVS with plans for upcoming procedure later on this week for his PAD.  He is bothered by back pain as well as claudication type pain.  He continues to be the caregiver for his wife.  He has remained on his Repatha  however self discontinued several of his medications, we again reviewed the indications and the necessity of his medications however there is some confusion around which medications he takes and he says he has a lot of pill bottles at home and he has no idea what he is taking. He has been taking his Repatha , cholesterol is excellently controlled. He denies chest pain, palpitations, dyspnea, pnd, orthopnea, n, v, dizziness, syncope, edema, weight gain, or early satiety.     ROS: Review of Systems  Cardiovascular:  Positive for claudication.  Musculoskeletal:  Positive for back pain.  All other systems reviewed and are negative.    Studies Reviewed: SABRA   EKG Interpretation Date/Time:  Wednesday December 15 2023 11:36:59 EDT Ventricular Rate:  78 PR Interval:  176 QRS Duration:  106 QT Interval:  396 QTC Calculation: 451 R Axis:  64  Text Interpretation: Normal sinus rhythm Incomplete right bundle branch block When compared with ECG of 23-Nov-2022 15:51, Premature ventricular complexes are no longer Present PR interval has decreased Confirmed by Carlin Nest [phone]) on 12/15/2023 11:38:34 AM    Cardiac Studies & Procedures    ______________________________________________________________________________________________ CARDIAC CATHETERIZATION  CARDIAC CATHETERIZATION 09/17/2022  Conclusion   Mid LAD lesion is 40% stenosed.   Dist LM lesion is 20% stenosed.   Ramus lesion is 30% stenosed.   Ost RCA to Prox RCA lesion is 100% stenosed.  1.  Chronic total occlusion of proximal right coronary artery with mild to moderate diffuse disease elsewhere. 2.  LVEDP of 21 mmHg.  Recommendation: The results were reviewed with the team C attending Dr. [place] and medical therapy will be pursued.  Findings Coronary Findings Diagnostic  Dominance: Right  Left Main Dist LM lesion is 20% stenosed.  Left Anterior Descending There is mild diffuse disease throughout the vessel. Mid LAD lesion is 40% stenosed.  Ramus Intermedius Ramus lesion is 30% stenosed.  Right Coronary Artery Ost RCA to Prox RCA lesion is 100% stenosed. The lesion was previously treated .  Third Right Posterolateral Branch Collaterals 3rd RPL filled by collaterals from 3rd Mrg.  Collaterals 3rd RPL filled by collaterals from 3rd Mrg.  Collaterals 3rd RPL filled by collaterals from 3rd Mrg.  Intervention  No interventions have been documented.   STRESS TESTS  MYOCARDIAL PERFUSION IMAGING 10/10/2019  Interpretation Summary  The left ventricular ejection fraction is mildly decreased (45-54%).  Nuclear stress EF: 45%.  There was no ST segment deviation noted during stress.  Defect 1: There is a medium defect of moderate severity present in the basal inferior, basal inferolateral, mid inferior, mid inferolateral and apex location.  Findings consistent with prior myocardial infarction.  This is an intermediate risk study.   ECHOCARDIOGRAM  ECHOCARDIOGRAM COMPLETE 07/10/2021  Narrative ECHOCARDIOGRAM REPORT    Patient Name:   [name] Dickerson Date of Exam: 07/10/2021 Medical Rec #:  [id number]    Height:       68.0 in Accession  #:    [id number]   Weight:       193.0 lb Date of Birth:  1946/01/01   BSA:          2.013 m Patient Age:    75 years     BP:           112/70 mmHg Patient Gender: M            HR:           74 bpm. Exam Location:    Procedure: 2D Echo, Cardiac Doppler, Color Doppler and Strain Analysis  Indications:    Coronary artery disease, occlusive [I25.10 (ICD-10-CM)]; Essential hypertension [I10 (ICD-10-CM)]; Type 2 diabetes mellitus without complication, without long-term current use of insulin  (HCC) [E11.9 (ICD-10-CM)]; Moderate cigarette smoker (10-19 per day) [F17.210 (ICD-10-CM)]  History:        Patient has prior history of Echocardiogram examinations, most recent 01/09/2013. CAD and Previous Myocardial Infarction; Risk Factors:Hypertension and Dyslipidemia.  Sonographer:    Charlie Jointer RDCS Referring Phys: [id number] ROBERT J KRASOWSKI  IMPRESSIONS   1. Left ventricular ejection fraction, by estimation, is 60 to 65%. The left ventricle has normal function. The left ventricle has no regional wall motion abnormalities. There is mild left ventricular hypertrophy. Left ventricular diastolic parameters are consistent with Grade I diastolic dysfunction (impaired relaxation). 2. Right ventricular systolic function is normal. The right ventricular size  is normal. 3. The mitral valve is normal in structure. No evidence of mitral valve regurgitation. No evidence of mitral stenosis. 4. The aortic valve is normal in structure. Aortic valve regurgitation is not visualized. No aortic stenosis is present. 5. The inferior vena cava is normal in size with greater than 50% respiratory variability, suggesting right atrial pressure of 3 mmHg.  FINDINGS Left Ventricle: Left ventricular ejection fraction, by estimation, is 60 to 65%. The left ventricle has normal function. The left ventricle has no regional wall motion abnormalities. The left ventricular internal cavity size was normal in size.  There is mild left ventricular hypertrophy. Left ventricular diastolic parameters are consistent with Grade I diastolic dysfunction (impaired relaxation).  Right Ventricle: The right ventricular size is normal. No increase in right ventricular wall thickness. Right ventricular systolic function is normal.  Left Atrium: Left atrial size was normal in size.  Right Atrium: Right atrial size was normal in size.  Pericardium: There is no evidence of pericardial effusion.  Mitral Valve: The mitral valve is normal in structure. No evidence of mitral valve regurgitation. No evidence of mitral valve stenosis.  Tricuspid Valve: The tricuspid valve is normal in structure. Tricuspid valve regurgitation is not demonstrated. No evidence of tricuspid stenosis.  Aortic Valve: The aortic valve is normal in structure. Aortic valve regurgitation is not visualized. No aortic stenosis is present.  Pulmonic Valve: The pulmonic valve was normal in structure. Pulmonic valve regurgitation is not visualized. No evidence of pulmonic stenosis.  Aorta: The aortic root is normal in size and structure.  Venous: The inferior vena cava is normal in size with greater than 50% respiratory variability, suggesting right atrial pressure of 3 mmHg.  IAS/Shunts: No atrial level shunt detected by color flow Doppler.   LEFT VENTRICLE PLAX 2D LVIDd:         5.00 cm   Diastology LVIDs:         3.50 cm   LV e' medial:    6.09 cm/s LV PW:         1.30 cm   LV E/e' medial:  14.5 LV IVS:        1.10 cm   LV e' lateral:   6.09 cm/s LVOT diam:     2.00 cm   LV E/e' lateral: 14.5 LV SV:         58 LV SV Index:   29 LVOT Area:     3.14 cm   RIGHT VENTRICLE            IVC RV Basal diam:  2.70 cm    IVC diam: 2.10 cm RV S prime:     7.07 cm/s TAPSE (M-mode): 1.3 cm  LEFT ATRIUM             Index        RIGHT ATRIUM           Index LA diam:        3.50 cm 1.74 cm/m   RA Area:     11.70 cm LA Vol (A2C):   43.8 ml 21.76  ml/m  RA Volume:   22.20 ml  11.03 ml/m LA Vol (A4C):   22.8 ml 11.33 ml/m LA Biplane Vol: 32.9 ml 16.34 ml/m AORTIC VALVE LVOT Vmax:   95.30 cm/s LVOT Vmean:  55.600 cm/s LVOT VTI:    0.184 m  AORTA Ao Root diam: 3.40 cm Ao Asc diam:  2.90 cm  MITRAL VALVE MV Area (PHT): 5.07 cm  SHUNTS MV Decel Time: 150 msec     Systemic VTI:  0.18 m MV E velocity: 88.25 cm/s   Systemic Diam: 2.00 cm MV A velocity: 104.00 cm/s MV E/A ratio:  0.85  Lamar Fitch MD Electronically signed by Lamar Fitch MD Signature Date/Time: 07/10/2021/5:29:19 PM    Final          ______________________________________________________________________________________________      Risk Assessment/Calculations:     HYPERTENSION CONTROL Vitals:   12/15/23 1123 12/15/23 1248  BP: (!) 140/70 (!) 155/60    [name] blood pressure is elevated above target today.  In order to address the patient's elevated BP: -- (restart amlodpine)          Physical Exam:   VS:  BP (!) 155/60   Pulse 78   Ht 5' 8 (1.727 m)   Wt 184 lb (83.5 kg)   SpO2 97%   BMI 27.98 kg/m    Wt Readings from Last 3 Encounters:  12/15/23 184 lb (83.5 kg)  11/23/23 186 lb 8 oz (84.6 kg)  08/24/23 185 lb (83.9 kg)    GEN: Well nourished, well developed in no acute distress NECK: No JVD; No carotid bruits CARDIAC: RRR, no murmurs, rubs, gallops RESPIRATORY:  Clear to auscultation without rales, wheezing or rhonchi  ABDOMEN: Soft, non-tender, non-distended EXTREMITIES:  No edema; No deformity   ASSESSMENT AND PLAN: .   CAD-extensive coronary artery disease dating back to 1995, most recent left heart cath in July of this year revealed CTO of RCA, best suited for medical therapy. Stable with no anginal symptoms.He is taking Plavix  as needed, encouraged him to resume-- but then he states [facility]VS surgeon told him to hold Plavix .   Hypertension-blood pressure is elevated, he self-discontinued his amlodipine ,  will restart today.    Dyslipidemia-he is still taking Repatha , stopped his statin as well as ezetimibe , and most recently LDL is excellently controlled at 31.   Tobacco abuse-currently smoking 1 PPD, not interested in cessation at this time, we discussed this at length, he is a primary caregiver for his wife who suffers with Alzheimer's and smoking is a reprieve for him at this time.  He is aware of the deleterious effects of this, advised pharmacological help with cessation however he politely declines.  PAD -follows with VVS.   Medication management -he frequently self discontinues his medications, at his last office visit we restarted his Repatha  and his Norvasc  as well as his Plavix .  Plavix  on hold per vascular surgeon.  Restart amlodipine  today.  Once he gets through with his procedure I like him to come back in a month to meet with the pharmacist and bring all of his medications from home for medication reconciliation.        Dispo: Restart Amlodpine 5 mg daily. Restart Plavix  at the direction of Vascular Surgeon. 1 month appt with PharmD for medication reconciliation, have already tried to streamline as much as possible secondary to propensity to self-d/c if his medication regimen is too complex. 6 mos with general cardiology.   Signed, Delon JAYSON Hoover, NP

## 2023-12-15 ENCOUNTER — Ambulatory Visit: Payer: PRIVATE HEALTH INSURANCE | Attending: Cardiology | Admitting: Cardiology

## 2023-12-15 ENCOUNTER — Encounter: Payer: Self-pay | Admitting: Cardiology

## 2023-12-15 VITALS — BP 155/60 | HR 78 | Ht 68.0 in | Wt 184.0 lb

## 2023-12-15 DIAGNOSIS — F17210 Nicotine dependence, cigarettes, uncomplicated: Secondary | ICD-10-CM

## 2023-12-15 DIAGNOSIS — I10 Essential (primary) hypertension: Secondary | ICD-10-CM

## 2023-12-15 DIAGNOSIS — I2510 Atherosclerotic heart disease of native coronary artery without angina pectoris: Secondary | ICD-10-CM

## 2023-12-15 DIAGNOSIS — I739 Peripheral vascular disease, unspecified: Secondary | ICD-10-CM

## 2023-12-15 DIAGNOSIS — Z79899 Other long term (current) drug therapy: Secondary | ICD-10-CM | POA: Diagnosis present

## 2023-12-15 DIAGNOSIS — E782 Mixed hyperlipidemia: Secondary | ICD-10-CM | POA: Diagnosis present

## 2023-12-15 MED ORDER — AMLODIPINE BESYLATE 5 MG PO TABS: 5.0000 mg | ORAL_TABLET | Freq: Every day | ORAL | 3 refills | Status: DC

## 2023-12-15 MED ORDER — CLOPIDOGREL BISULFATE 75 MG PO TABS: 75.0000 mg | ORAL_TABLET | Freq: Every day | ORAL | 3 refills | Status: AC

## 2023-12-15 NOTE — Patient Instructions (Signed)
 Medication Instructions:  Your physician has recommended you make the following change in your medication:  Re Start Amlodipine  5 mg once daily Re Start Clopidogrel  75 mg once daily, follow advice of surgeon for holding this medication for procedure  *If you need a refill on your cardiac medications before your next appointment, please call your pharmacy*  Lab Work: NONE If you have labs (blood work) drawn today and your tests are completely normal, you will receive your results only by: MyChart Message (if you have MyChart) OR A paper copy in the mail If you have any lab test that is abnormal or we need to change your treatment, we will call you to review the results.  Testing/Procedures: NONE  Follow-Up: At [facility], you and your health needs are our priority.  As part of our continuing mission to provide you with exceptional heart care, our providers are all part of one team.  This team includes your primary Cardiologist (physician) and Advanced Practice Providers or APPs (Physician Assistants and Nurse Practitioners) who all work together to provide you with the care you need, when you need it.  Your next appointment:   6 month(s)  Provider:   Lamar Fitch, MD    We recommend signing up for the patient portal called MyChart.  Sign up information is provided on this After Visit Summary.  MyChart is used to connect with patients for Virtual Visits (Telemedicine).  Patients are able to view lab/test results, encounter notes, upcoming appointments, etc.  Non-urgent messages can be sent to your provider as well.   To learn more about what you can do with MyChart, go to [email or web address].   Other Instructions Visit with pharmacist in 1 month to go over medications. Please bring all of your medications with you to this visit

## 2023-12-17 ENCOUNTER — Ambulatory Visit (HOSPITAL_COMMUNITY)
Admission: RE | Admit: 2023-12-17 | Discharge: 2023-12-17 | Disposition: A | Discharge status: Home / Self Care | Payer: PRIVATE HEALTH INSURANCE | Attending: Vascular Surgery | Admitting: Vascular Surgery

## 2023-12-17 ENCOUNTER — Other Ambulatory Visit: Payer: Self-pay

## 2023-12-17 ENCOUNTER — Ambulatory Visit (HOSPITAL_COMMUNITY)
Admission: RE | Disposition: A | Discharge status: Home / Self Care | Payer: Self-pay | Source: Home / Self Care | Attending: Vascular Surgery

## 2023-12-17 DIAGNOSIS — F17210 Nicotine dependence, cigarettes, uncomplicated: Secondary | ICD-10-CM | POA: Insufficient documentation

## 2023-12-17 DIAGNOSIS — Z7985 Long-term (current) use of injectable non-insulin antidiabetic drugs: Secondary | ICD-10-CM | POA: Insufficient documentation

## 2023-12-17 DIAGNOSIS — I70213 Atherosclerosis of native arteries of extremities with intermittent claudication, bilateral legs: Secondary | ICD-10-CM | POA: Diagnosis present

## 2023-12-17 DIAGNOSIS — Z59868 Other specified financial insecurity: Secondary | ICD-10-CM | POA: Diagnosis not present

## 2023-12-17 DIAGNOSIS — Z79899 Other long term (current) drug therapy: Secondary | ICD-10-CM | POA: Insufficient documentation

## 2023-12-17 HISTORY — PX: LOWER EXTREMITY ANGIOGRAPHY: CATH118251

## 2023-12-17 HISTORY — PX: ABDOMINAL AORTOGRAM W/LOWER EXTREMITY: CATH118223

## 2023-12-17 LAB — POCT I-STAT, CHEM 8
BUN: 18 mg/dL (ref 8–23)
Calcium, Ion: 1.23 mmol/L (ref 1.15–1.40)
Chloride: 102 mmol/L (ref 98–111)
Creatinine, Ser: 1 mg/dL (ref 0.61–1.24)
Glucose, Bld: 140 mg/dL — ABNORMAL HIGH (ref 70–99)
HCT: 33 % — ABNORMAL LOW (ref 39.0–52.0)
Hemoglobin: 11.2 g/dL — ABNORMAL LOW (ref 13.0–17.0)
Potassium: 3.9 mmol/L (ref 3.5–5.1)
Sodium: 140 mmol/L (ref 135–145)
TCO2: 24 mmol/L (ref 22–32)

## 2023-12-17 LAB — GLUCOSE, CAPILLARY: Glucose-Capillary: 100 mg/dL — ABNORMAL HIGH (ref 70–99)

## 2023-12-17 MED ORDER — SODIUM CHLORIDE 0.9 % WEIGHT BASED INFUSION: 1.0000 mL/kg/h | INTRAVENOUS | Status: DC

## 2023-12-17 MED ORDER — LABETALOL HCL 5 MG/ML IV SOLN: 10.0000 mg | INTRAVENOUS | Status: DC | PRN

## 2023-12-17 MED ORDER — LIDOCAINE HCL (PF) 1 % IJ SOLN
INTRAMUSCULAR | Status: AC
  Filled 2023-12-17: qty 30

## 2023-12-17 MED ORDER — HEPARIN (PORCINE) IN NACL 1000-0.9 UT/500ML-% IV SOLN
INTRAVENOUS | Status: DC | PRN
  Administered 2023-12-17: 1000 mL

## 2023-12-17 MED ORDER — IODIXANOL 320 MG/ML IV SOLN
INTRAVENOUS | Status: DC | PRN
  Administered 2023-12-17: 125 mL via INTRA_ARTERIAL

## 2023-12-17 MED ORDER — HYDRALAZINE HCL 20 MG/ML IJ SOLN: 5.0000 mg | INTRAMUSCULAR | Status: DC | PRN

## 2023-12-17 MED ORDER — FENTANYL CITRATE (PF) 100 MCG/2ML IJ SOLN
INTRAMUSCULAR | Status: DC | PRN
  Administered 2023-12-17: 50 ug via INTRAVENOUS

## 2023-12-17 MED ORDER — MIDAZOLAM HCL (PF) 2 MG/2ML IJ SOLN
INTRAMUSCULAR | Status: DC | PRN
Start: 2023-12-17 — End: 2023-12-17
  Administered 2023-12-17: 1 mg via INTRAVENOUS

## 2023-12-17 MED ORDER — FENTANYL CITRATE (PF) 100 MCG/2ML IJ SOLN
INTRAMUSCULAR | Status: AC
  Filled 2023-12-17: qty 2

## 2023-12-17 MED ORDER — LIDOCAINE HCL (PF) 1 % IJ SOLN
INTRAMUSCULAR | Status: DC | PRN
Start: 2023-12-17 — End: 2023-12-17
  Administered 2023-12-17 (×2): 10 mL via INTRADERMAL

## 2023-12-17 MED ORDER — SODIUM CHLORIDE 0.9 % IV SOLN: INTRAVENOUS | Status: DC

## 2023-12-17 MED ORDER — MIDAZOLAM HCL 2 MG/2ML IJ SOLN
INTRAMUSCULAR | Status: AC
  Filled 2023-12-17: qty 2

## 2023-12-17 MED ORDER — ONDANSETRON HCL 4 MG/2ML IJ SOLN: 4.0000 mg | Freq: Four times a day (QID) | INTRAMUSCULAR | Status: DC | PRN

## 2023-12-17 MED ORDER — ACETAMINOPHEN 325 MG PO TABS: 650.0000 mg | ORAL_TABLET | ORAL | Status: DC | PRN

## 2023-12-17 MED FILL — Lidocaine HCl Local Preservative Free (PF) Inj 1%: INTRAMUSCULAR | Qty: 30 | Status: AC

## 2023-12-17 NOTE — Progress Notes (Addendum)
 Site area: bilateral femoral  Site Prior to Removal:  Level 0 Pressure Applied For: right side, 15 minutes, Left side 15 minutes Manual: yes    Patient Status During Pull:  stable Post Pull Site:  Level 0 Post Pull Instructions Given:  yes Post Pull Pulses Present: Bilateral femoral +3,  bilateral PT and right DP doppler Dressing Applied:  gauze and tegaderm Bedrest begins @  1055 Comments:

## 2023-12-17 NOTE — Op Note (Addendum)
 DATE OF SERVICE: 12/17/2023  PATIENT:  [name] Dickerson  78 y.o. male  PRE-OPERATIVE DIAGNOSIS:  Atherosclerosis of native arteries of bilateral lower extremities causing claudication  POST-OPERATIVE DIAGNOSIS:  Same  PROCEDURE:   1) Ultrasound guided bilateral common femoral artery access (CPT [phone]) 2) Aortogram (CPT [phone]) 3) Bilateral lower extremity runoff angiogram from pelvis (CPT 75716) 4) Conscious sedation (38 minutes) (CPT 99152)  SURGEON:  Debby SAILOR. Magda, MD  ASSISTANT: none  ANESTHESIA:   local and IV sedation  ESTIMATED BLOOD LOSS: min  LOCAL MEDICATIONS USED:  LIDOCAINE    COUNTS: confirmed correct.  PATIENT DISPOSITION:  PACU - hemodynamically stable.   Delay start of Pharmacological VTE agent (>24hrs) due to surgical blood loss or risk of bleeding: no  INDICATION FOR PROCEDURE: [name] Dickerson is a 78 y.o. male with bilateral lower extremity claudication. After careful discussion of risks, benefits, and alternatives the patient was offered angiogram. The patient understood and wished to proceed.  OPERATIVE FINDINGS:   Left renal artery: patent Right renal artery: patent  Infrarenal aorta: patent  Left common iliac artery: patent Right common iliac artery: flush occlusion at origin.   Left internal iliac artery: occluded beyond the origin Right internal iliac artery: occluded  Left external iliac artery: patent Right external iliac artery: occluded  Left common femoral artery: patent Right common femoral artery: reconstitutes via pelvic collaterals  Left profunda femoris artery: patent Right profunda femoris artery: patent  Left superficial femoral artery: Focal occlusion in the mid thigh Right superficial femoral artery: patent  Left popliteal artery: patent Right popliteal artery: patent  Left anterior tibial artery: patent Right anterior tibial artery: patent  Left tibioperoneal trunk: patent Right tibioperoneal trunk: patent  Left peroneal artery: patent  Right peroneal artery: patent  Left posterior tibial artery: patent Right posterior tibial artery: patent  Left pedal circulation: disadvantaged Right pedal circulation: disadvantaged   GLASS score. N/A.  WIfI score. N/A.  DESCRIPTION OF PROCEDURE: After identification of the patient in the pre-operative holding area, the patient was transferred to the operating room. The patient was positioned supine on the operating room table. Anesthesia was induced. The groins was prepped and draped in standard fashion. A surgical pause was performed confirming correct patient, procedure, and operative location.  The left groin was anesthetized with subcutaneous injection of 1% lidocaine . Using ultrasound guidance, the left common femoral artery was accessed with micropuncture technique.  Fluoroscopy was used to confirm cannulation over the femoral head. The 727F micropuncture sheath was upsized to 27F.   A Benson wire was advanced into the distal aorta. Over the wire an omni flush catheter was advanced to the level of L2. Aortogram was performed - see above for details. Bilateral lower extremity runoff angiogram was performed.  The right groin was anesthetized with subcutaneous injection of 1% lidocaine . Using ultrasound guidance, the right common femoral artery was accessed with micropuncture technique.  Fluoroscopy was used to confirm cannulation over the femoral head. The 727F micropuncture sheath was upsized to 27F.   I attempted to cross the right iliac occlusion retrograde, but could not reenter the aorta.  The sheaths were left in place to be removed in the recovery area.  Conscious sedation was administered with the use of IV fentanyl  and midazolam  under continuous physician and nurse monitoring.  Heart rate, blood pressure, and oxygen  saturation were continuously monitored.  Total sedation time was 38 minutes  Upon completion of the case instrument and sharps counts were confirmed correct. The patient  was transferred to the PACU in good condition. I was present for all portions of the procedure.  PLAN: Options include aortobifemoral bypass or left to right femoral-femoral bypass with antegrade left superficial femoral artery stenting.  I counseled the patient about risks, benefits, and alternatives.  He prefers femoral-femoral bypass.  We will plan to do this in the near future as the schedule allows.  Debby SAILOR. Magda, MD [facility] Phone Number: [phone] 12/17/2023 10:09 AM

## 2023-12-17 NOTE — Progress Notes (Signed)
 Patient ambulated to BR. Bilateral groin remain unremarkable.

## 2023-12-17 NOTE — Interval H&P Note (Signed)
 History and Physical Interval Note:  12/17/2023 10:09 AM  [name] Dickerson  has presented today for surgery, with the diagnosis of atherosclerosis.  The various methods of treatment have been discussed with the patient and family. After consideration of risks, benefits and other options for treatment, the patient has consented to  Procedure(s): ABDOMINAL AORTOGRAM W/LOWER EXTREMITY (N/A) Lower Extremity Angiography (N/A) as a surgical intervention.  [name] history has been reviewed, patient examined, no change in status, stable for surgery.  I have reviewed the patient's chart and labs.  Questions were answered to the patient's satisfaction.     Debby LOISE Robertson

## 2023-12-20 ENCOUNTER — Other Ambulatory Visit: Payer: Self-pay

## 2023-12-20 ENCOUNTER — Encounter (HOSPITAL_COMMUNITY): Payer: Self-pay | Admitting: Vascular Surgery

## 2023-12-20 DIAGNOSIS — I998 Other disorder of circulatory system: Secondary | ICD-10-CM

## 2023-12-20 DIAGNOSIS — I70213 Atherosclerosis of native arteries of extremities with intermittent claudication, bilateral legs: Secondary | ICD-10-CM

## 2023-12-20 MED FILL — Lidocaine HCl Local Preservative Free (PF) Inj 1%: INTRAMUSCULAR | Qty: 30 | Status: AC

## 2023-12-22 ENCOUNTER — Ambulatory Visit: Payer: PRIVATE HEALTH INSURANCE | Admitting: Cardiology

## 2023-12-31 NOTE — Pre-Procedure Instructions (Signed)
 Surgical Instructions   Your procedure is scheduled on January 06, 2024. Report to [facility] Main Entrance A at 5:30 A.M., then check in with the Admitting office. Any questions or running late day of surgery: call [phone]  Questions prior to your surgery date: call [phone], Monday-Friday, 8am-4pm. If you experience any cold or flu symptoms such as cough, fever, chills, shortness of breath, etc. between now and your scheduled surgery, please notify us  at the above number.     Remember:  Do not eat or drink after midnight the night before your surgery. No gum, mints, or hard candy.    Take these medicines the morning of surgery with A SIP OF WATER: levothyroxine  (SYNTHROID )  tamsulosin (FLOMAX)   May take these medicines IF NEEDED: nitroGLYCERIN  (NITROSTAT ) 0.4 MG SL tablet   YOU WILL NEED TO HOLD YOUR  PLAVIX  FOR 5 DAYS BEFORE YOUR PROCEDURE. TAKE YOUR LAST DOSE ON 10/31.   YOU MAY CONTINUE TAKING ASPIRIN .   YOU WILL NEED TO HOLD YOUR OZEMPIC 7 DAYS PRIOR TO YOUR PROCEDURE.  DO NOT INJECT ON 11/2. DO not take after 12/29/23    One week prior to surgery, STOP taking any  Aleve, Naproxen, Ibuprofen, Motrin, Advil, Goody's, BC's, all herbal medications, fish oil, and non-prescription vitamins.                  HOW TO MANAGE YOUR DIABETES BEFORE AND AFTER SURGERY  Why is it important to control my blood sugar before and after surgery? Improving blood sugar levels before and after surgery helps healing and can limit problems. A way of improving blood sugar control is eating a healthy diet by:  Eating less sugar and carbohydrates  Increasing activity/exercise  Talking with your doctor about reaching your blood sugar goals High blood sugars (greater than 180 mg/dL) can raise your risk of infections and slow your recovery, so you will need to focus on controlling your diabetes during the weeks before surgery. Make sure that the doctor who takes care of your diabetes  knows about your planned surgery including the date and location.  How do I manage my blood sugar before surgery? Check your blood sugar at least 4 times a day, starting 2 days before surgery, to make sure that the level is not too high or low.  Check your blood sugar the morning of your surgery when you wake up and every 2 hours until you get to the Short Stay unit.  If your blood sugar is less than 70 mg/dL, you will need to treat for low blood sugar: Do not take insulin . Treat a low blood sugar (less than 70 mg/dL) with  cup of clear juice (cranberry or apple), 4 glucose tablets, OR glucose gel. Recheck blood sugar in 15 minutes after treatment (to make sure it is greater than 70 mg/dL). If your blood sugar is not greater than 70 mg/dL on recheck, call [phone] for further instructions. Report your blood sugar to the short stay nurse when you get to Short Stay.  If you are admitted to the hospital after surgery: Your blood sugar will be checked by the staff and you will probably be given insulin  after surgery (instead of oral diabetes medicines) to make sure you have good blood sugar levels. The goal for blood sugar control after surgery is 80-180 mg/dL.  Do NOT Smoke (Tobacco/Vaping) for 24 hours prior to your procedure.  If you use a CPAP at night, you may bring your mask/headgear for your  overnight stay.   You will be asked to remove any contacts, glasses, piercing's, hearing aid's, dentures/partials prior to surgery. Please bring cases for these items if needed.    Patients discharged the day of surgery will not be allowed to drive home, and someone needs to stay with them for 24 hours.  SURGICAL WAITING ROOM VISITATION Patients may have no more than 2 support people in the waiting area - these visitors may rotate.   Pre-op nurse will coordinate an appropriate time for 1 ADULT support person, who may not rotate, to accompany patient in pre-op.  Children under the age of 79  must have an adult with them who is not the patient and must remain in the main waiting area with an adult.  If the patient needs to stay at the hospital during part of their recovery, the visitor guidelines for inpatient rooms apply.  Please refer to the [facility] website for the visitor guidelines for any additional information.   If you received a COVID test during your pre-op visit  it is requested that you wear a mask when out in public, stay away from anyone that may not be feeling well and notify your surgeon if you develop symptoms. If you have been in contact with anyone that has tested positive in the last 10 days please notify you surgeon.      Pre-operative CHG Bathing Instructions   You can play a key role in reducing the risk of infection after surgery. Your skin needs to be as free of germs as possible. You can reduce the number of germs on your skin by washing with CHG (chlorhexidine gluconate) soap before surgery. CHG is an antiseptic soap that kills germs and continues to kill germs even after washing.   DO NOT use if you have an allergy to chlorhexidine/CHG or antibacterial soaps. If your skin becomes reddened or irritated, stop using the CHG and notify one of our RNs at [phone].              TAKE A SHOWER THE NIGHT BEFORE SURGERY   Please keep in mind the following:  DO NOT shave, including legs and underarms, 48 hours prior to surgery.   You may shave your face before/day of surgery.  Place clean sheets on your bed the night before surgery Use a clean washcloth (not used since being washed) for shower. DO NOT sleep with pet's night before surgery.  CHG Shower Instructions:  Wash your face and private area with normal soap. If you choose to wash your hair, wash first with your normal shampoo.  After you use shampoo/soap, rinse your hair and body thoroughly to remove shampoo/soap residue.  Turn the water OFF and apply half the bottle of CHG soap to a CLEAN  washcloth.  Apply CHG soap ONLY FROM YOUR NECK DOWN TO YOUR TOES (washing for 3-5 minutes)  DO NOT use CHG soap on face, private areas, open wounds, or sores.  Pay special attention to the area where your surgery is being performed.  If you are having back surgery, having someone wash your back for you may be helpful. Wait 2 minutes after CHG soap is applied, then you may rinse off the CHG soap.  Pat dry with a clean towel  Put on clean pajamas    Additional instructions for the day of surgery: If you choose, you may shower the morning of surgery with an antibacterial soap.  DO NOT APPLY any lotions, deodorants, cologne, or perfumes.  Do not wear jewelry or makeup Do not wear nail polish, gel polish, artificial nails, or any other type of covering on natural nails (fingers and toes) Do not bring valuables to the hospital. [facility] is not responsible for valuables/personal belongings. Put on clean/comfortable clothes.  Please brush your teeth.  Ask your nurse before applying any prescription medications to the skin.

## 2024-01-03 ENCOUNTER — Encounter (HOSPITAL_COMMUNITY): Payer: Self-pay

## 2024-01-03 ENCOUNTER — Encounter (HOSPITAL_COMMUNITY)
Admission: RE | Admit: 2024-01-03 | Discharge: 2024-01-03 | Disposition: A | Discharge status: Home / Self Care | Source: Ambulatory Visit | Attending: Vascular Surgery | Admitting: Vascular Surgery

## 2024-01-03 ENCOUNTER — Other Ambulatory Visit: Payer: Self-pay

## 2024-01-03 VITALS — BP 138/70 | HR 81 | Temp 97.8°F | Resp 18 | Ht 68.0 in | Wt 183.4 lb

## 2024-01-03 DIAGNOSIS — I252 Old myocardial infarction: Secondary | ICD-10-CM | POA: Insufficient documentation

## 2024-01-03 DIAGNOSIS — I70213 Atherosclerosis of native arteries of extremities with intermittent claudication, bilateral legs: Secondary | ICD-10-CM | POA: Insufficient documentation

## 2024-01-03 DIAGNOSIS — I2510 Atherosclerotic heart disease of native coronary artery without angina pectoris: Secondary | ICD-10-CM | POA: Insufficient documentation

## 2024-01-03 DIAGNOSIS — Z955 Presence of coronary angioplasty implant and graft: Secondary | ICD-10-CM | POA: Insufficient documentation

## 2024-01-03 DIAGNOSIS — Z01812 Encounter for preprocedural laboratory examination: Secondary | ICD-10-CM | POA: Insufficient documentation

## 2024-01-03 DIAGNOSIS — E785 Hyperlipidemia, unspecified: Secondary | ICD-10-CM | POA: Insufficient documentation

## 2024-01-03 DIAGNOSIS — E119 Type 2 diabetes mellitus without complications: Secondary | ICD-10-CM | POA: Insufficient documentation

## 2024-01-03 DIAGNOSIS — Z01818 Encounter for other preprocedural examination: Secondary | ICD-10-CM

## 2024-01-03 DIAGNOSIS — I4519 Other right bundle-branch block: Secondary | ICD-10-CM | POA: Insufficient documentation

## 2024-01-03 DIAGNOSIS — I493 Ventricular premature depolarization: Secondary | ICD-10-CM | POA: Insufficient documentation

## 2024-01-03 DIAGNOSIS — I119 Hypertensive heart disease without heart failure: Secondary | ICD-10-CM | POA: Insufficient documentation

## 2024-01-03 DIAGNOSIS — E039 Hypothyroidism, unspecified: Secondary | ICD-10-CM | POA: Insufficient documentation

## 2024-01-03 DIAGNOSIS — E1165 Type 2 diabetes mellitus with hyperglycemia: Secondary | ICD-10-CM

## 2024-01-03 DIAGNOSIS — I998 Other disorder of circulatory system: Secondary | ICD-10-CM

## 2024-01-03 LAB — CBC
HCT: 42.5 % (ref 39.0–52.0)
Hemoglobin: 14.1 g/dL (ref 13.0–17.0)
MCH: 29.6 pg (ref 26.0–34.0)
MCHC: 33.2 g/dL (ref 30.0–36.0)
MCV: 89.3 fL (ref 80.0–100.0)
Platelets: 180 K/uL (ref 150–400)
RBC: 4.76 MIL/uL (ref 4.22–5.81)
RDW: 13.3 % (ref 11.5–15.5)
WBC: 6.6 K/uL (ref 4.0–10.5)
nRBC: 0 % (ref 0.0–0.2)

## 2024-01-03 LAB — URINALYSIS, ROUTINE W REFLEX MICROSCOPIC
Bilirubin Urine: NEGATIVE
Glucose, UA: 50 mg/dL — AB
Hgb urine dipstick: NEGATIVE
Ketones, ur: NEGATIVE mg/dL
Leukocytes,Ua: NEGATIVE
Nitrite: NEGATIVE
Protein, ur: NEGATIVE mg/dL
Specific Gravity, Urine: 1.015 (ref 1.005–1.030)
pH: 5 (ref 5.0–8.0)

## 2024-01-03 LAB — COMPREHENSIVE METABOLIC PANEL WITH GFR
ALT: 20 U/L (ref 0–44)
AST: 23 U/L (ref 15–41)
Albumin: 4 g/dL (ref 3.5–5.0)
Alkaline Phosphatase: 82 U/L (ref 38–126)
Anion gap: 10 (ref 5–15)
BUN: 11 mg/dL (ref 8–23)
CO2: 25 mmol/L (ref 22–32)
Calcium: 9.1 mg/dL (ref 8.9–10.3)
Chloride: 101 mmol/L (ref 98–111)
Creatinine, Ser: 1.01 mg/dL (ref 0.61–1.24)
GFR, Estimated: >60 mL/min (ref >60)
Glucose, Bld: 129 mg/dL — ABNORMAL HIGH (ref 70–99)
Potassium: 3.9 mmol/L (ref 3.5–5.1)
Sodium: 136 mmol/L (ref 135–145)
Total Bilirubin: 0.5 mg/dL (ref 0.0–1.2)
Total Protein: 7.1 g/dL (ref 6.5–8.1)

## 2024-01-03 LAB — HEMOGLOBIN A1C
Hgb A1c MFr Bld: 5.6 % (ref 4.8–5.6)
Mean Plasma Glucose: 114.02 mg/dL

## 2024-01-03 LAB — SURGICAL PCR SCREEN
MRSA, PCR: NEGATIVE
Staphylococcus aureus: NEGATIVE

## 2024-01-03 LAB — GLUCOSE, CAPILLARY: Glucose-Capillary: 167 mg/dL — ABNORMAL HIGH (ref 70–99)

## 2024-01-03 LAB — APTT: aPTT: 31 s (ref 24–36)

## 2024-01-03 LAB — PROTIME-INR
INR: 1 (ref 0.8–1.2)
Prothrombin Time: 13.8 s (ref 11.4–15.2)

## 2024-01-03 NOTE — Progress Notes (Signed)
 PCP - Dr. Elex Reese Cardiologist - Dr. Mikel  Chest x-ray -  EKG - 12/15/23 Stress Test - 10/10/19 ECHO - 07/10/21 Cardiac Cath - 09/17/22  Sleep Study - denies CPAP -   Fasting Blood Sugar - 130's Checks Blood Sugar : seldom  Last dose of GLP1 agonist-  12/12/23 GLP1 instructions: stop 1 week  Blood Thinner Instructions: Stop Plavix  5 days-- Pt stopped on own prior, Last dose 12/18/23 Aspirin  Instructions: Ok to continue ASA--pt reports he's not taking ASA  ERAS Protcol - NPO PRE-SURGERY Ensure or G2-    Anesthesia review: Yes, cardiac history  Patient denies shortness of breath, fever, cough and chest pain at PAT appointment

## 2024-01-04 NOTE — Anesthesia Preprocedure Evaluation (Signed)
 Anesthesia Evaluation  Patient identified by MRN, date of birth, ID band Patient awake    Reviewed: Allergy & Precautions, NPO status , Patient's Chart, lab work & pertinent test results  History of Anesthesia Complications Negative for: history of anesthetic complications  Airway Mallampati: III  TM Distance: <3 FB Neck ROM: Full    Dental  (+) Teeth Intact, Dental Advisory Given   Pulmonary Current Smoker and Patient abstained from smoking.   breath sounds clear to auscultation       Cardiovascular hypertension, + CAD, + Past MI, + Cardiac Stents and + Peripheral Vascular Disease  + dysrhythmias (RBBB)  Rhythm:Regular Rate:Normal  Cath 09/17/2022:   Mid LAD lesion is 40% stenosed.   Dist LM lesion is 20% stenosed.   Ramus lesion is 30% stenosed.   Ost RCA to Prox RCA lesion is 100% stenosed.   TTE 07/10/2021: 1. Left ventricular ejection fraction, by estimation, is 60 to 65%. The  left ventricle has normal function. The left ventricle has no regional  wall motion abnormalities. There is mild left ventricular hypertrophy.  Left ventricular diastolic parameters  are consistent with Grade I diastolic dysfunction (impaired relaxation).   2. Right ventricular systolic function is normal. The right ventricular  size is normal.   3. The mitral valve is normal in structure. No evidence of mitral valve  regurgitation. No evidence of mitral stenosis.   4. The aortic valve is normal in structure. Aortic valve regurgitation is  not visualized. No aortic stenosis is present.   5. The inferior vena cava is normal in size with greater than 50%  respiratory variability, suggesting right atrial pressure of 3 mmHg.     Neuro/Psych  PSYCHIATRIC DISORDERS Anxiety        GI/Hepatic   Endo/Other  diabetes, Poorly Controlled, Type 2Hypothyroidism    Renal/GU      Musculoskeletal   Abdominal   Peds  Hematology   Anesthesia  Other Findings   Reproductive/Obstetrics                              Anesthesia Physical Anesthesia Plan  ASA: 3  Anesthesia Plan: General   Post-op Pain Management:    Induction: Intravenous  PONV Risk Score and Plan: 2 and Ondansetron , Dexamethasone  and Treatment may vary due to age or medical condition  Airway Management Planned: Oral ETT  Additional Equipment: Arterial line  Intra-op Plan:   Post-operative Plan: Extubation in OR  Informed Consent:      Dental advisory given  Plan Discussed with: CRNA  Anesthesia Plan Comments: (PAT note by Lynwood Hope, PA-C: 78 year old male follows with cardiology for history of HTN, HLD, PVCs, extensive CAD. In 1995 he suffered a STEMI, with DES to his RCA. In 2003 he underwent PCI with overlapping bare-metal stents x 2 to his mid RCA. Admitted 09/16/2022 for non-STEMI underwent left heart cath on 09/17/2022 which revealed CTO of proximal RCA with mild to moderate diffuse disease elsewhere, recommendations for medical therapy. Evaluated on 11/23/2022, started on Repatha  and referred to vascular for PAD. Evaluated by vascular for PAD, he was advised to increase his walking and stop smoking.  Last seen in cardiology follow-up by Delon Hoover, NP on 12/15/2023.  Per note, stable with no anginal symptoms.  He also reported recently discontinuing all of his medications, however, he was amenable to restarting lipid-lowering therapy and Plavix .  He was also recommended to restart amlodipine  5 mg daily.  Continues to smoke 1 PPD.  Upcoming vascular surgery was discussed.  Other pertinent history includes hypothyroidism, non-insulin -dependent DM2.  Patient reports last dose Plavix  12/18/2023.  Reports last dose GLP-1 agonist 12/12/2023.  He was instructed to hold 1 week prior to surgery.  Preop labs reviewed, unremarkable.  DM2 well-controlled with A1c of 5.6.  EKG 12/15/2023: Normal sinus rhythm.  Rate 78. Incomplete  right bundle branch block  Cath 09/17/2022:   Mid LAD lesion is 40% stenosed.   Dist LM lesion is 20% stenosed.   Ramus lesion is 30% stenosed.   Ost RCA to Prox RCA lesion is 100% stenosed.  1.  Chronic total occlusion of proximal right coronary artery with mild to moderate diffuse disease elsewhere. 2.  LVEDP of 21 mmHg.  Recommendation: The results were reviewed with the team C attending Dr. Jordan and medical therapy will be pursued.  Carotid duplex 06/24/2022: Summary:  Right Carotid: Velocities in the right ICA are consistent with a 1-39% stenosis.  Left Carotid: Velocities in the left ICA are consistent with a 1-39% stenosis.  Vertebrals:Bilateral vertebral arteries demonstrate antegrade flow.  Subclavians: Normal flow hemodynamics were seen in bilateral subclavian arteries.   TTE 07/10/2021: 1. Left ventricular ejection fraction, by estimation, is 60 to 65%. The  left ventricle has normal function. The left ventricle has no regional  wall motion abnormalities. There is mild left ventricular hypertrophy.  Left ventricular diastolic parameters  are consistent with Grade I diastolic dysfunction (impaired relaxation).  2. Right ventricular systolic function is normal. The right ventricular  size is normal.  3. The mitral valve is normal in structure. No evidence of mitral valve  regurgitation. No evidence of mitral stenosis.  4. The aortic valve is normal in structure. Aortic valve regurgitation is  not visualized. No aortic stenosis is present.  5. The inferior vena cava is normal in size with greater than 50%  respiratory variability, suggesting right atrial pressure of 3 mmHg.    )         Anesthesia Quick Evaluation

## 2024-01-04 NOTE — Progress Notes (Signed)
 Anesthesia Chart Review:  78 year old male follows with cardiology for history of HTN, HLD, PVCs, extensive CAD. In 1995 he suffered a STEMI, with DES to his RCA. In 2003 he underwent PCI with overlapping bare-metal stents x 2 to his mid RCA. Admitted 09/16/2022 for non-STEMI underwent left heart cath on 09/17/2022 which revealed CTO of proximal RCA with mild to moderate diffuse disease elsewhere, recommendations for medical therapy. Evaluated on 11/23/2022, started on Repatha  and referred to vascular for PAD. Evaluated by vascular for PAD, he was advised to increase his walking and stop smoking.  Last seen in cardiology follow-up by Delon Hoover, NP on 12/15/2023.  Per note, stable with no anginal symptoms.  He also reported recently discontinuing all of his medications, however, he was amenable to restarting lipid-lowering therapy and Plavix .  He was also recommended to restart amlodipine  5 mg daily.  Continues to smoke 1 PPD.  Upcoming vascular surgery was discussed.  Other pertinent history includes hypothyroidism, non-insulin -dependent DM2.  Patient reports last dose Plavix  12/18/2023.  Reports last dose GLP-1 agonist 12/12/2023.  He was instructed to hold 1 week prior to surgery.  Preop labs reviewed, unremarkable.  DM2 well-controlled with A1c of 5.6.  EKG 12/15/2023: Normal sinus rhythm.  Rate 78. Incomplete right bundle branch block  Cath 09/17/2022:   Mid LAD lesion is 40% stenosed.   Dist LM lesion is 20% stenosed.   Ramus lesion is 30% stenosed.   Ost RCA to Prox RCA lesion is 100% stenosed.   1.  Chronic total occlusion of proximal right coronary artery with mild to moderate diffuse disease elsewhere. 2.  LVEDP of 21 mmHg.   Recommendation: The results were reviewed with the team C attending Dr. Jordan and medical therapy will be pursued.  Carotid duplex 06/24/2022: Summary:  Right Carotid: Velocities in the right ICA are consistent with a 1-39% stenosis.  Left Carotid: Velocities  in the left ICA are consistent with a 1-39% stenosis.  Vertebrals: Bilateral vertebral arteries demonstrate antegrade flow.  Subclavians: Normal flow hemodynamics were seen in bilateral subclavian arteries.   TTE 07/10/2021: 1. Left ventricular ejection fraction, by estimation, is 60 to 65%. The  left ventricle has normal function. The left ventricle has no regional  wall motion abnormalities. There is mild left ventricular hypertrophy.  Left ventricular diastolic parameters  are consistent with Grade I diastolic dysfunction (impaired relaxation).   2. Right ventricular systolic function is normal. The right ventricular  size is normal.   3. The mitral valve is normal in structure. No evidence of mitral valve  regurgitation. No evidence of mitral stenosis.   4. The aortic valve is normal in structure. Aortic valve regurgitation is  not visualized. No aortic stenosis is present.   5. The inferior vena cava is normal in size with greater than 50%  respiratory variability, suggesting right atrial pressure of 3 mmHg.      Lynwood Geofm [facility] Short Stay Center/Anesthesiology Phone [phone] 01/04/2024 11:41 AM

## 2024-01-06 ENCOUNTER — Other Ambulatory Visit: Payer: Self-pay

## 2024-01-06 ENCOUNTER — Encounter (HOSPITAL_COMMUNITY)
Admission: RE | Disposition: A | Discharge status: Home / Self Care | Payer: Self-pay | Source: Home / Self Care | Attending: Vascular Surgery

## 2024-01-06 ENCOUNTER — Inpatient Hospital Stay (HOSPITAL_COMMUNITY)

## 2024-01-06 ENCOUNTER — Encounter (HOSPITAL_COMMUNITY): Payer: Self-pay | Admitting: Vascular Surgery

## 2024-01-06 ENCOUNTER — Inpatient Hospital Stay (HOSPITAL_COMMUNITY): Payer: Self-pay | Admitting: Certified Registered Nurse Anesthetist

## 2024-01-06 ENCOUNTER — Inpatient Hospital Stay (HOSPITAL_COMMUNITY)
Admission: RE | Admit: 2024-01-06 | Discharge: 2024-01-07 | DRG: 253 | Disposition: A | Discharge status: Home / Self Care | Attending: Vascular Surgery | Admitting: Vascular Surgery

## 2024-01-06 ENCOUNTER — Inpatient Hospital Stay (HOSPITAL_COMMUNITY): Payer: Self-pay | Admitting: Physician Assistant

## 2024-01-06 DIAGNOSIS — E039 Hypothyroidism, unspecified: Secondary | ICD-10-CM | POA: Diagnosis present

## 2024-01-06 DIAGNOSIS — I739 Peripheral vascular disease, unspecified: Principal | ICD-10-CM | POA: Diagnosis present

## 2024-01-06 DIAGNOSIS — F17210 Nicotine dependence, cigarettes, uncomplicated: Secondary | ICD-10-CM | POA: Diagnosis present

## 2024-01-06 DIAGNOSIS — N400 Enlarged prostate without lower urinary tract symptoms: Secondary | ICD-10-CM | POA: Diagnosis present

## 2024-01-06 DIAGNOSIS — I70203 Unspecified atherosclerosis of native arteries of extremities, bilateral legs: Secondary | ICD-10-CM | POA: Diagnosis not present

## 2024-01-06 DIAGNOSIS — F411 Generalized anxiety disorder: Secondary | ICD-10-CM | POA: Diagnosis present

## 2024-01-06 DIAGNOSIS — I745 Embolism and thrombosis of iliac artery: Secondary | ICD-10-CM | POA: Diagnosis present

## 2024-01-06 DIAGNOSIS — Z716 Tobacco abuse counseling: Secondary | ICD-10-CM

## 2024-01-06 DIAGNOSIS — E785 Hyperlipidemia, unspecified: Secondary | ICD-10-CM | POA: Diagnosis present

## 2024-01-06 DIAGNOSIS — I10 Essential (primary) hypertension: Secondary | ICD-10-CM | POA: Diagnosis present

## 2024-01-06 DIAGNOSIS — Z9049 Acquired absence of other specified parts of digestive tract: Secondary | ICD-10-CM | POA: Diagnosis not present

## 2024-01-06 DIAGNOSIS — Z955 Presence of coronary angioplasty implant and graft: Secondary | ICD-10-CM | POA: Diagnosis not present

## 2024-01-06 DIAGNOSIS — E1142 Type 2 diabetes mellitus with diabetic polyneuropathy: Secondary | ICD-10-CM | POA: Diagnosis present

## 2024-01-06 DIAGNOSIS — E1151 Type 2 diabetes mellitus with diabetic peripheral angiopathy without gangrene: Secondary | ICD-10-CM | POA: Diagnosis present

## 2024-01-06 DIAGNOSIS — Z888 Allergy status to other drugs, medicaments and biological substances status: Secondary | ICD-10-CM | POA: Diagnosis not present

## 2024-01-06 DIAGNOSIS — E1165 Type 2 diabetes mellitus with hyperglycemia: Secondary | ICD-10-CM

## 2024-01-06 DIAGNOSIS — Z7982 Long term (current) use of aspirin: Secondary | ICD-10-CM | POA: Diagnosis not present

## 2024-01-06 DIAGNOSIS — I252 Old myocardial infarction: Secondary | ICD-10-CM | POA: Diagnosis not present

## 2024-01-06 DIAGNOSIS — Z59868 Other specified financial insecurity: Secondary | ICD-10-CM

## 2024-01-06 DIAGNOSIS — I70213 Atherosclerosis of native arteries of extremities with intermittent claudication, bilateral legs: Principal | ICD-10-CM | POA: Diagnosis present

## 2024-01-06 DIAGNOSIS — I2510 Atherosclerotic heart disease of native coronary artery without angina pectoris: Secondary | ICD-10-CM | POA: Diagnosis present

## 2024-01-06 HISTORY — PX: THORACIC AORTIC ENDOVASCULAR STENT GRAFT: SHX6112

## 2024-01-06 HISTORY — PX: FEMORAL-FEMORAL BYPASS GRAFT: SHX936

## 2024-01-06 HISTORY — PX: LOWER EXTREMITY ANGIOGRAM: SHX5508

## 2024-01-06 LAB — CBC
HCT: 31.2 % — ABNORMAL LOW (ref 39.0–52.0)
Hemoglobin: 10.4 g/dL — ABNORMAL LOW (ref 13.0–17.0)
MCH: 29.8 pg (ref 26.0–34.0)
MCHC: 33.3 g/dL (ref 30.0–36.0)
MCV: 89.4 fL (ref 80.0–100.0)
Platelets: 123 K/uL — ABNORMAL LOW (ref 150–400)
RBC: 3.49 MIL/uL — ABNORMAL LOW (ref 4.22–5.81)
RDW: 13.7 % (ref 11.5–15.5)
WBC: 4.9 K/uL (ref 4.0–10.5)
nRBC: 0 % (ref 0.0–0.2)

## 2024-01-06 LAB — POCT I-STAT 7, (LYTES, BLD GAS, ICA,H+H)
Acid-Base Excess: 0 mmol/L (ref 0.0–2.0)
Acid-base deficit: 1 mmol/L (ref 0.0–2.0)
Acid-base deficit: 2 mmol/L (ref 0.0–2.0)
Bicarbonate: 26.1 mmol/L (ref 20.0–28.0)
Bicarbonate: 25.8 mmol/L (ref 20.0–28.0)
Bicarbonate: 24.5 mmol/L (ref 20.0–28.0)
Calcium, Ion: 1.24 mmol/L (ref 1.15–1.40)
Calcium, Ion: 1.17 mmol/L (ref 1.15–1.40)
Calcium, Ion: 1.15 mmol/L (ref 1.15–1.40)
HCT: 34 % — ABNORMAL LOW (ref 39.0–52.0)
HCT: 28 % — ABNORMAL LOW (ref 39.0–52.0)
HCT: 26 % — ABNORMAL LOW (ref 39.0–52.0)
Hemoglobin: 11.6 g/dL — ABNORMAL LOW (ref 13.0–17.0)
Hemoglobin: 9.5 g/dL — ABNORMAL LOW (ref 13.0–17.0)
Hemoglobin: 8.8 g/dL — ABNORMAL LOW (ref 13.0–17.0)
O2 Saturation: 100 %
O2 Saturation: 99 %
O2 Saturation: 99 %
Patient temperature: 35.3
Patient temperature: 35.1
Patient temperature: 35.3
Potassium: 3.8 mmol/L (ref 3.5–5.1)
Potassium: 4.1 mmol/L (ref 3.5–5.1)
Potassium: 4 mmol/L (ref 3.5–5.1)
Sodium: 138 mmol/L (ref 135–145)
Sodium: 138 mmol/L (ref 135–145)
Sodium: 138 mmol/L (ref 135–145)
TCO2: 28 mmol/L (ref 22–32)
TCO2: 27 mmol/L (ref 22–32)
TCO2: 26 mmol/L (ref 22–32)
pCO2 arterial: 50.2 mmHg — ABNORMAL HIGH (ref 32–48)
pCO2 arterial: 44.6 mmHg (ref 32–48)
pCO2 arterial: 44.5 mmHg (ref 32–48)
pH, Arterial: 7.315 — ABNORMAL LOW (ref 7.35–7.45)
pH, Arterial: 7.362 (ref 7.35–7.45)
pH, Arterial: 7.341 — ABNORMAL LOW (ref 7.35–7.45)
pO2, Arterial: 236 mmHg — ABNORMAL HIGH (ref 83–108)
pO2, Arterial: 144 mmHg — ABNORMAL HIGH (ref 83–108)
pO2, Arterial: 140 mmHg — ABNORMAL HIGH (ref 83–108)

## 2024-01-06 LAB — POCT ACTIVATED CLOTTING TIME
Activated Clotting Time: 216 s
Activated Clotting Time: 256 s
Activated Clotting Time: 250 s
Activated Clotting Time: 256 s
Activated Clotting Time: 227 s

## 2024-01-06 LAB — GLUCOSE, CAPILLARY
Glucose-Capillary: 170 mg/dL — ABNORMAL HIGH (ref 70–99)
Glucose-Capillary: 167 mg/dL — ABNORMAL HIGH (ref 70–99)

## 2024-01-06 LAB — PREPARE RBC (CROSSMATCH)

## 2024-01-06 LAB — ABO/RH: ABO/RH(D): O POS

## 2024-01-06 LAB — CREATININE, SERUM
Creatinine, Ser: 0.95 mg/dL (ref 0.61–1.24)
GFR, Estimated: >60 mL/min (ref >60)

## 2024-01-06 SURGERY — CREATION, BYPASS, ARTERIAL, FEMORAL TO FEMORAL, USING GRAFT
Anesthesia: General | Laterality: Left

## 2024-01-06 MED ORDER — HEMOSTATIC AGENTS (NO CHARGE) OPTIME
TOPICAL | Status: DC | PRN
  Administered 2024-01-06 (×3): 1 via TOPICAL

## 2024-01-06 MED ORDER — HYDRALAZINE HCL 20 MG/ML IJ SOLN: 5.0000 mg | INTRAMUSCULAR | Status: DC | PRN

## 2024-01-06 MED ORDER — ONDANSETRON HCL 4 MG/2ML IJ SOLN: 4.0000 mg | Freq: Once | INTRAMUSCULAR | Status: DC | PRN

## 2024-01-06 MED ORDER — ACETAMINOPHEN 325 MG PO TABS: 325.0000 mg | ORAL_TABLET | ORAL | Status: DC | PRN

## 2024-01-06 MED ORDER — PROTAMINE SULFATE 10 MG/ML IV SOLN
INTRAVENOUS | Status: DC | PRN
  Administered 2024-01-06: 50 mg via INTRAVENOUS

## 2024-01-06 MED ORDER — SODIUM CHLORIDE 0.9 % IV SOLN: 500.0000 mL | Freq: Once | INTRAVENOUS | Status: DC | PRN

## 2024-01-06 MED ORDER — FENTANYL CITRATE (PF) 250 MCG/5ML IJ SOLN
INTRAMUSCULAR | Status: DC | PRN
  Administered 2024-01-06 (×4): 50 ug via INTRAVENOUS

## 2024-01-06 MED ORDER — ROCURONIUM BROMIDE 10 MG/ML (PF) SYRINGE
PREFILLED_SYRINGE | INTRAVENOUS | Status: AC
  Filled 2024-01-06: qty 10

## 2024-01-06 MED ORDER — METOPROLOL TARTRATE 5 MG/5ML IV SOLN: 2.5000 mg | INTRAVENOUS | Status: DC | PRN

## 2024-01-06 MED ORDER — SODIUM CHLORIDE 0.9 % IV SOLN: INTRAVENOUS | Status: DC

## 2024-01-06 MED ORDER — POTASSIUM CHLORIDE CRYS ER 20 MEQ PO TBCR: 40.0000 meq | EXTENDED_RELEASE_TABLET | Freq: Every day | ORAL | Status: DC | PRN

## 2024-01-06 MED ORDER — PROPOFOL 10 MG/ML IV BOLUS
INTRAVENOUS | Status: DC | PRN
  Administered 2024-01-06: 130 mg via INTRAVENOUS

## 2024-01-06 MED ORDER — HEPARIN 6000 UNIT IRRIGATION SOLUTION
Status: DC | PRN
  Administered 2024-01-06: 1

## 2024-01-06 MED ORDER — FENTANYL CITRATE (PF) 100 MCG/2ML IJ SOLN: 25.0000 ug | INTRAMUSCULAR | Status: DC | PRN

## 2024-01-06 MED ORDER — SODIUM CHLORIDE 0.9% IV SOLUTION: Freq: Once | INTRAVENOUS | Status: DC

## 2024-01-06 MED ORDER — HEPARIN SODIUM (PORCINE) 1000 UNIT/ML IJ SOLN
INTRAMUSCULAR | Status: AC
Start: 2024-01-06 — End: 2024-01-06
  Filled 2024-01-06: qty 10

## 2024-01-06 MED ORDER — ROCURONIUM BROMIDE 10 MG/ML (PF) SYRINGE
PREFILLED_SYRINGE | INTRAVENOUS | Status: DC | PRN
  Administered 2024-01-06: 20 mg via INTRAVENOUS
  Administered 2024-01-06 (×3): 10 mg via INTRAVENOUS
  Administered 2024-01-06: 70 mg via INTRAVENOUS
  Administered 2024-01-06: 10 mg via INTRAVENOUS

## 2024-01-06 MED ORDER — PHENYLEPHRINE 80 MCG/ML (10ML) SYRINGE FOR IV PUSH (FOR BLOOD PRESSURE SUPPORT)
PREFILLED_SYRINGE | INTRAVENOUS | Status: DC | PRN
  Administered 2024-01-06: 160 ug via INTRAVENOUS
  Administered 2024-01-06 (×2): 80 ug via INTRAVENOUS
  Administered 2024-01-06: 160 ug via INTRAVENOUS
  Administered 2024-01-06 (×4): 80 ug via INTRAVENOUS

## 2024-01-06 MED ORDER — HEPARIN SODIUM (PORCINE) 1000 UNIT/ML IJ SOLN
INTRAMUSCULAR | Status: DC | PRN
  Administered 2024-01-06: 10000 [IU] via INTRAVENOUS
  Administered 2024-01-06: 1000 [IU] via INTRAVENOUS
  Administered 2024-01-06: 2000 [IU] via INTRAVENOUS
  Administered 2024-01-06: 1000 [IU] via INTRAVENOUS
  Administered 2024-01-06: 3000 [IU] via INTRAVENOUS

## 2024-01-06 MED ORDER — OXYCODONE-ACETAMINOPHEN 5-325 MG PO TABS
1.0000 | ORAL_TABLET | ORAL | Status: DC | PRN
  Administered 2024-01-06 – 2024-01-07 (×2): 1 via ORAL
  Filled 2024-01-06 (×2): qty 1

## 2024-01-06 MED ORDER — HYDROMORPHONE HCL 1 MG/ML IJ SOLN
0.5000 mg | INTRAMUSCULAR | Status: DC | PRN
  Administered 2024-01-06: 0.5 mg via INTRAVENOUS
  Filled 2024-01-06: qty 0.5

## 2024-01-06 MED ORDER — AMLODIPINE BESYLATE 5 MG PO TABS
5.0000 mg | ORAL_TABLET | Freq: Every day | ORAL | Status: DC
  Administered 2024-01-06 – 2024-01-07 (×2): 5 mg via ORAL
  Filled 2024-01-06 (×2): qty 1

## 2024-01-06 MED ORDER — ROCURONIUM BROMIDE 10 MG/ML (PF) SYRINGE
PREFILLED_SYRINGE | INTRAVENOUS | Status: AC
Start: 2024-01-06 — End: 2024-01-06
  Filled 2024-01-06: qty 10

## 2024-01-06 MED ORDER — FOLIC ACID 1 MG PO TABS
1.0000 mg | ORAL_TABLET | Freq: Every day | ORAL | Status: DC
  Administered 2024-01-06 – 2024-01-07 (×2): 1 mg via ORAL
  Filled 2024-01-06 (×2): qty 1

## 2024-01-06 MED ORDER — ONDANSETRON HCL 4 MG/2ML IJ SOLN
INTRAMUSCULAR | Status: DC | PRN
  Administered 2024-01-06: 4 mg via INTRAVENOUS

## 2024-01-06 MED ORDER — INSULIN ASPART 100 UNIT/ML IJ SOLN: 0.0000 [IU] | INTRAMUSCULAR | Status: DC | PRN

## 2024-01-06 MED ORDER — ALPRAZOLAM 0.5 MG PO TABS
0.5000 mg | ORAL_TABLET | Freq: Every day | ORAL | Status: DC
  Administered 2024-01-06: 0.5 mg via ORAL
  Filled 2024-01-06: qty 1

## 2024-01-06 MED ORDER — IODIXANOL 320 MG/ML IV SOLN
INTRAVENOUS | Status: DC | PRN
Start: 2024-01-06 — End: 2024-01-06
  Administered 2024-01-06: 8 mL via INTRA_ARTERIAL
  Administered 2024-01-06: 36 mL via INTRA_ARTERIAL

## 2024-01-06 MED ORDER — CHLORHEXIDINE GLUCONATE CLOTH 2 % EX PADS: 6.0000 | MEDICATED_PAD | Freq: Once | CUTANEOUS | Status: DC

## 2024-01-06 MED ORDER — ONDANSETRON HCL 4 MG/2ML IJ SOLN
INTRAMUSCULAR | Status: AC
  Filled 2024-01-06: qty 2

## 2024-01-06 MED ORDER — FUROSEMIDE 20 MG PO TABS
20.0000 mg | ORAL_TABLET | ORAL | Status: DC
  Administered 2024-01-07: 20 mg via ORAL
  Filled 2024-01-06: qty 1

## 2024-01-06 MED ORDER — LIDOCAINE 2% (20 MG/ML) 5 ML SYRINGE
INTRAMUSCULAR | Status: DC | PRN
  Administered 2024-01-06: 100 mg via INTRAVENOUS

## 2024-01-06 MED ORDER — LACTATED RINGERS IV SOLN: INTRAVENOUS | Status: DC

## 2024-01-06 MED ORDER — FENTANYL CITRATE (PF) 100 MCG/2ML IJ SOLN
25.0000 ug | INTRAMUSCULAR | Status: DC | PRN
  Administered 2024-01-06: 25 ug via INTRAVENOUS

## 2024-01-06 MED ORDER — ASPIRIN 325 MG PO TBEC
325.0000 mg | DELAYED_RELEASE_TABLET | Freq: Every day | ORAL | Status: DC
  Administered 2024-01-07: 325 mg via ORAL
  Filled 2024-01-06: qty 1

## 2024-01-06 MED ORDER — ONDANSETRON HCL 4 MG/2ML IJ SOLN: 4.0000 mg | Freq: Four times a day (QID) | INTRAMUSCULAR | Status: DC | PRN

## 2024-01-06 MED ORDER — PROPOFOL 10 MG/ML IV BOLUS
INTRAVENOUS | Status: AC
  Filled 2024-01-06: qty 20

## 2024-01-06 MED ORDER — CEFAZOLIN SODIUM-DEXTROSE 2-4 GM/100ML-% IV SOLN
2.0000 g | INTRAVENOUS | Status: AC
  Administered 2024-01-06: 2 g via INTRAVENOUS
  Filled 2024-01-06: qty 100

## 2024-01-06 MED ORDER — CHLORHEXIDINE GLUCONATE 0.12 % MT SOLN
15.0000 mL | Freq: Once | OROMUCOSAL | Status: AC
  Administered 2024-01-06: 15 mL via OROMUCOSAL
  Filled 2024-01-06: qty 15

## 2024-01-06 MED ORDER — PHENYLEPHRINE HCL-NACL 20-0.9 MG/250ML-% IV SOLN
INTRAVENOUS | Status: DC | PRN
  Administered 2024-01-06: 40 ug/min via INTRAVENOUS

## 2024-01-06 MED ORDER — LABETALOL HCL 5 MG/ML IV SOLN: 10.0000 mg | INTRAVENOUS | Status: DC | PRN

## 2024-01-06 MED ORDER — ACETAMINOPHEN 650 MG RE SUPP: 325.0000 mg | RECTAL | Status: DC | PRN

## 2024-01-06 MED ORDER — DROPERIDOL 2.5 MG/ML IJ SOLN: 0.6250 mg | Freq: Once | INTRAMUSCULAR | Status: DC | PRN

## 2024-01-06 MED ORDER — DOCUSATE SODIUM 100 MG PO CAPS
100.0000 mg | ORAL_CAPSULE | Freq: Every day | ORAL | Status: DC
  Administered 2024-01-07: 100 mg via ORAL
  Filled 2024-01-06: qty 1

## 2024-01-06 MED ORDER — PHENOL 1.4 % MT LIQD: 1.0000 | OROMUCOSAL | Status: DC | PRN

## 2024-01-06 MED ORDER — PROTAMINE SULFATE 10 MG/ML IV SOLN
INTRAVENOUS | Status: AC
  Filled 2024-01-06: qty 5

## 2024-01-06 MED ORDER — CEFAZOLIN SODIUM-DEXTROSE 2-4 GM/100ML-% IV SOLN
2.0000 g | Freq: Three times a day (TID) | INTRAVENOUS | Status: AC
  Administered 2024-01-06 (×2): 2 g via INTRAVENOUS
  Filled 2024-01-06 (×2): qty 100

## 2024-01-06 MED ORDER — ORAL CARE MOUTH RINSE
15.0000 mL | Freq: Once | OROMUCOSAL | Status: AC
Start: 2024-01-06 — End: 2024-01-06

## 2024-01-06 MED ORDER — OXYCODONE HCL 5 MG/5ML PO SOLN: 5.0000 mg | Freq: Once | ORAL | Status: DC | PRN

## 2024-01-06 MED ORDER — HEPARIN SODIUM (PORCINE) 5000 UNIT/ML IJ SOLN
5000.0000 [IU] | Freq: Three times a day (TID) | INTRAMUSCULAR | Status: DC
  Filled 2024-01-06: qty 1

## 2024-01-06 MED ORDER — PHENYLEPHRINE 80 MCG/ML (10ML) SYRINGE FOR IV PUSH (FOR BLOOD PRESSURE SUPPORT)
PREFILLED_SYRINGE | INTRAVENOUS | Status: AC
  Filled 2024-01-06: qty 10

## 2024-01-06 MED ORDER — FENTANYL CITRATE (PF) 100 MCG/2ML IJ SOLN
INTRAMUSCULAR | Status: AC
  Filled 2024-01-06: qty 2

## 2024-01-06 MED ORDER — CLOPIDOGREL BISULFATE 75 MG PO TABS
75.0000 mg | ORAL_TABLET | Freq: Every day | ORAL | Status: DC
  Administered 2024-01-06 – 2024-01-07 (×2): 75 mg via ORAL
  Filled 2024-01-06 (×2): qty 1

## 2024-01-06 MED ORDER — NITROGLYCERIN 0.4 MG SL SUBL: 0.4000 mg | SUBLINGUAL_TABLET | SUBLINGUAL | Status: DC | PRN

## 2024-01-06 MED ORDER — TAMSULOSIN HCL 0.4 MG PO CAPS
0.4000 mg | ORAL_CAPSULE | ORAL | Status: DC
  Administered 2024-01-07: 0.4 mg via ORAL
  Filled 2024-01-06: qty 1

## 2024-01-06 MED ORDER — FENTANYL CITRATE (PF) 250 MCG/5ML IJ SOLN
INTRAMUSCULAR | Status: AC
  Filled 2024-01-06: qty 5

## 2024-01-06 MED ORDER — LIDOCAINE 2% (20 MG/ML) 5 ML SYRINGE
INTRAMUSCULAR | Status: AC
  Filled 2024-01-06: qty 5

## 2024-01-06 MED ORDER — ACETAMINOPHEN 10 MG/ML IV SOLN: 1000.0000 mg | Freq: Once | INTRAVENOUS | Status: DC | PRN

## 2024-01-06 MED ORDER — POLYETHYLENE GLYCOL 3350 17 G PO PACK: 17.0000 g | PACK | Freq: Every day | ORAL | Status: DC | PRN

## 2024-01-06 MED ORDER — GABAPENTIN 300 MG PO CAPS
300.0000 mg | ORAL_CAPSULE | Freq: Every day | ORAL | Status: DC
  Administered 2024-01-06: 300 mg via ORAL
  Filled 2024-01-06: qty 1

## 2024-01-06 MED ORDER — OXYCODONE HCL 5 MG PO TABS: 5.0000 mg | ORAL_TABLET | Freq: Once | ORAL | Status: DC | PRN

## 2024-01-06 MED ORDER — 0.9 % SODIUM CHLORIDE (POUR BTL) OPTIME
TOPICAL | Status: DC | PRN
  Administered 2024-01-06: 2000 mL

## 2024-01-06 MED ORDER — HEPARIN 6000 UNIT IRRIGATION SOLUTION
Status: AC
Start: 2024-01-06 — End: 2024-01-06
  Filled 2024-01-06: qty 500

## 2024-01-06 MED ORDER — BISACODYL 10 MG RE SUPP: 10.0000 mg | Freq: Every day | RECTAL | Status: DC | PRN

## 2024-01-06 MED ORDER — SUGAMMADEX SODIUM 200 MG/2ML IV SOLN
INTRAVENOUS | Status: DC | PRN
  Administered 2024-01-06: 100 mg via INTRAVENOUS
  Administered 2024-01-06: 200 mg via INTRAVENOUS

## 2024-01-06 MED ORDER — LEVOTHYROXINE SODIUM 75 MCG PO TABS
175.0000 ug | ORAL_TABLET | Freq: Every day | ORAL | Status: DC
  Administered 2024-01-07: 175 ug via ORAL
  Filled 2024-01-06: qty 1

## 2024-01-06 MED ORDER — ALBUMIN HUMAN 5 % IV SOLN: INTRAVENOUS | Status: DC | PRN

## 2024-01-06 SURGICAL SUPPLY — 45 items
BAG COUNTER SPONGE SURGICOUNT (BAG) ×3 IMPLANT
BALLOON MUSTANG 5X80X75 (BALLOONS) IMPLANT
BENZOIN TINCTURE PRP APPL 2/3 (GAUZE/BANDAGES/DRESSINGS) IMPLANT
CANISTER SUCTION 3000ML PPV (SUCTIONS) ×3 IMPLANT
CANNULA VESSEL 3MM 2 BLNT TIP (CANNULA) ×3 IMPLANT
CATH QUICKCROSS .035X135CM (MICROCATHETER) IMPLANT
ELECTRODE REM PT RTRN 9FT ADLT (ELECTROSURGICAL) ×3 IMPLANT
EVACUATOR SILICONE 100CC (DRAIN) IMPLANT
FELT TEFLON 1X6 (MISCELLANEOUS) IMPLANT
GAUZE SPONGE 4X4 12PLY STRL (GAUZE/BANDAGES/DRESSINGS) ×3 IMPLANT
GLIDEWIRE ADV .035X260CM (WIRE) IMPLANT
GLOVE BIO SURGEON STRL SZ8 (GLOVE) ×3 IMPLANT
GOWN STRL REUS W/ TWL LRG LVL3 (GOWN DISPOSABLE) ×6 IMPLANT
GOWN STRL REUS W/ TWL XL LVL3 (GOWN DISPOSABLE) ×3 IMPLANT
GRAFT VASC STRG 30X8KNIT (Vascular Products) IMPLANT
HEMOSTAT SNOW SURGICEL 2X4 (HEMOSTASIS) IMPLANT
INSERT FOGARTY SM (MISCELLANEOUS) IMPLANT
KIT BASIN OR (CUSTOM PROCEDURE TRAY) ×3 IMPLANT
KIT ENCORE 26 ADVANTAGE (KITS) IMPLANT
KIT TURNOVER KIT B (KITS) ×3 IMPLANT
LOOP VESSEL MINI RED (MISCELLANEOUS) IMPLANT
PACK PERIPHERAL VASCULAR (CUSTOM PROCEDURE TRAY) ×3 IMPLANT
PAD ARMBOARD POSITIONER FOAM (MISCELLANEOUS) ×6 IMPLANT
SET MICROPUNCTURE 5F STIFF (MISCELLANEOUS) IMPLANT
SET WALTER ACTIVATION W/DRAPE (SET/KITS/TRAYS/PACK) IMPLANT
SHEATH PINNACLE 6F 10CM (SHEATH) IMPLANT
SOLN 0.9% NACL POUR BTL 1000ML (IV SOLUTION) ×6 IMPLANT
SOLN STERILE WATER BTL 1000 ML (IV SOLUTION) ×3 IMPLANT
SPONGE SURGIFOAM ABS GEL 100 (HEMOSTASIS) IMPLANT
STENT ELUVIA 6X150X130 (Permanent Stent) IMPLANT
STOPCOCK 3WAY HIGH PRESSURE (MISCELLANEOUS) IMPLANT
STRIP CLOSURE SKIN 1/2X4 (GAUZE/BANDAGES/DRESSINGS) IMPLANT
SUT MNCRL AB 4-0 PS2 18 (SUTURE) ×6 IMPLANT
SUT PROLENE 5 0 C 1 24 (SUTURE) ×3 IMPLANT
SUT PROLENE 5 0 C 1 36 (SUTURE) IMPLANT
SUT PROLENE 6 0 BV (SUTURE) ×3 IMPLANT
SUT SILK 2 0 PERMA HAND 18 BK (SUTURE) IMPLANT
SUT SILK 2 0 SH (SUTURE) ×3 IMPLANT
SUT VIC AB 2-0 CT1 TAPERPNT 27 (SUTURE) ×6 IMPLANT
SUT VIC AB 3-0 SH 27X BRD (SUTURE) ×6 IMPLANT
TOWEL GREEN STERILE (TOWEL DISPOSABLE) ×3 IMPLANT
TRAY FOLEY MTR SLVR 14FR STAT (SET/KITS/TRAYS/PACK) IMPLANT
TRAY FOLEY MTR SLVR 16FR STAT (SET/KITS/TRAYS/PACK) ×3 IMPLANT
UNDERPAD 30X36 HEAVY ABSORB (UNDERPADS AND DIAPERS) ×3 IMPLANT
WIRE BENTSON .035X145CM (WIRE) IMPLANT

## 2024-01-06 NOTE — Anesthesia Procedure Notes (Signed)
 Arterial Line Insertion Start/End11/08/2023 7:10 AM, 01/06/2024 7:15 AM Performed by: Waddell Lauraine NOVAK, MD, Christopher Comings, CRNA, anesthesiologist  Patient location: Pre-op. Preanesthetic checklist: patient identified, IV checked, site marked, risks and benefits discussed, surgical consent, monitors and equipment checked, pre-op evaluation, timeout performed and anesthesia consent Lidocaine  1% used for infiltration Right, radial was placed Catheter size: 20 G Hand hygiene performed , maximum sterile barriers used  and Seldinger technique used Allen's test indicative of satisfactory collateral circulation Attempts: 2 Procedure performed using ultrasound to evaluate access site. Ultrasound Notes:relevant anatomy identified, ultrasound used to visualize needle entry and vessel patent under ultrasound. Following insertion, dressing applied. Post procedure assessment: normal and unchanged  Patient tolerated the procedure well with no immediate complications. Additional procedure comments: First attempt by SRNA unsuccessful.SABRA

## 2024-01-06 NOTE — Progress Notes (Signed)
 PHARMACIST LIPID MONITORING   [name] Dickerson is a 78 y.o. male admitted on 01/06/2024 with PVD.  Pharmacy has been consulted to optimize lipid-lowering therapy with the indication of secondary prevention for clinical ASCVD.  Recent Labs:  Lipid Panel (last 6 months):   No results found for: CHOL, TRIG, HDL, CHOLHDL, VLDL, LDLCALC, LDLDIRECT  Hepatic function panel (last 6 months):   Lab Results  Component Value Date   AST 23 01/03/2024   ALT 20 01/03/2024   ALKPHOS 82 01/03/2024   BILITOT 0.5 01/03/2024    SCr (since admission):   Serum creatinine: 0.95 mg/dL 88/93/74 8599 Estimated creatinine clearance: 70.5 mL/min  Current therapy and lipid therapy tolerance Current lipid-lowering therapy: Repatha  Previous lipid-lowering therapies (if applicable): Crestor Documented or reported allergies or intolerances to lipid-lowering therapies (if applicable): Myalgias with statins   Plan:    1.Statin intensity (high intensity recommended for all patients regardless of the LDL):  Statin intolerance noted. No statin changes due to serious side effects (ex. Myalgias with at least 2 different statins).  2.Add ezetimibe  (if any one of the following):   Not indicated at this time.  3.Refer to lipid clinic:   No  4.Follow-up with:  Primary care provider - Pia Kerney SQUIBB, MD  5.Follow-up labs after discharge:  No changes in lipid therapy, repeat a lipid panel in one year.     Prentice Poisson, PharmD Clinical Pharmacist **Pharmacist phone directory can now be found on [email or web address] ([email or web address]).  Listed under [facility].

## 2024-01-06 NOTE — H&P (Signed)
 [facility] [place]  ASSESSMENT / PLAN: [name] Dickerson is a 78 y.o. male with atherosclerosis of native arteries of bilateral lower extremities - right worse than left causing intermittent claudication.  Recommend:  Abstinence from all tobacco products. Blood glucose control with goal A1c < 7%. Blood pressure control with goal blood pressure < 140/90 mmHg. Lipid reduction therapy with goal LDL-C <100 mg/dL Aspirin  81mg  PO QD.  Atorvastatin  40-80mg  PO QD (or other high intensity statin therapy).  Plan left-to-right femoral-femoral bypass and left superficial femoral angioplasty and stenting today in OR for bilateral lower extremity claudication.  CHIEF COMPLAINT: cramping pain with walking  HISTORY OF PRESENT ILLNESS: [name] Dickerson is a 79 y.o. male referred to clinic for evaluation of cramping discomfort with walking.  The patient also describes back pain which radiates down the legs and is worse with standing.  He has fairly typical symptoms of both neurogenic and vascular claudication.  He does not have rest pain symptoms.  He has no ulcers about his feet.  He is mostly bothered about the discomfort in his lower back.  He reports massage therapies have been very helpful for him, but massage therapist has been reluctant to do massage for him because of his recent diagnosis of peripheral arterial disease.  We reviewed the natural history of peripheral arterial disease.  I explained the benign nature of the diagnosis of claudication.  I explained the rationale for smoking cessation and walk therapy.  05/25/2023: Patient returns to clinic for follow-up.  He reports persistent discomfort in his hips bilaterally.  He does report some cramping discomfort in this lower extremity musculature, especially when moving quickly or moving uphill.  This is not limiting him in his ADLs or IADLs.  He has no rest pain.  He has no ulcers about his feet.  He is not interested in intervention  at this time.  11/23/23: Patient presents to clinic for evaluation.  He reports his symptoms have worsened.  He is bothered more and more by his claudication.  He does not describe any rest pain.  He does not have any ulcers about his feet.  We reviewed the risk, benefits, and alternatives to intervention for intermittent claudication including increased risk of amputation after intervention.  He is understanding and wants to proceed.  01/16/24: presents for surgery. No new issues. All questions answered. Reviewed risks / benefits / alternatives. Again reviewed ABF vs. Fem-fem + SFA stenting.   Past Medical History:  Diagnosis Date   Arthritis 10/24/2018   Formatting of this note might be different from the original.  Back  Formatting of this note might be different from the original.  Back     Atherosclerotic heart disease of native coronary artery without angina pectoris 11/23/2022   CAD S/P percutaneous coronary angioplasty 1995, 2003   Inferior STEMI-PTCA RCA; 2003: Overlapping MultiLink Zeta BMS -RCA (4.0 x 23, 4.0 x 15); 09/2001 Cutting Balloon PTCA of stents for ISR.;; Myoview  August 2013 - Mod-severe sized fixed INFERIOR perfusion defect => likely infarct with mild peri-infarct ischemia in the apical inferior wall, EF 55% with distal inferior hypokinesis   Carotid artery stenosis    Cholecystitis, chronic 03/30/2016   Cigarette smoker 10/24/2018   Coronary artery disease, occlusive 04/08/2011   Inferior STEMI-PTCA RCA; 2003: overlapping MultiLink Zeta BMS -RCA (4.0 mm x 23 mm, 4.0 mm x 15 mm); August 2003 Cutting Balloon PTCA of stents for ISR.;; Negative Myoview  August 2013  Formatting of this note  might be different from the original.  Overview:   Inferior STEMI-PTCA RCA; 2003: overlapping MultiLink Zeta BMS -RCA (4.0 mm x 23 mm, 4.0 mm x 15 mm); August 2003 Cutting Balloon PTCA of ste   Disease of thyroid  gland 10/24/2018   Essential hypertension    Foot pain, bilateral 09/14/2023    Generalized anxiety disorder 12/13/2023   Hip pain 07/04/2021   Hyperglycemia due to type 2 diabetes mellitus (HCC) 12/13/2023   Hyperlipidemia LDL goal <70    Hyperlipidemia, unspecified 03/04/2016   Statin intolerance. Monitored by PCP.  Formatting of this note might be different from the original.  Overview:   Statin intolerance. Monitored by PCP.     Last Assessment & Plan:   Myalgias, fatigue and laziness wall taking statins. He is on niacin.  He is due to get labs checked by PCP. Provided they're stable with lipid levels at goal range, continue without more aggressive treatment. If not, I   Hypothyroidism    Idiopathic osteoarthritis 12/13/2023   Intermittent claudication due to atherosclerosis of artery of extremity 09/14/2023   Light cigarette smoker (1-9 cigarettes per day) 10/24/2018   Was down to 5 cigarettes a day.  Formatting of this note might be different from the original.  Was down to 5 cigarettes a day.     Last Assessment & Plan:   Pretty much stable at 5 cigarettes a day.  I told him about the need to finally quit but he is not interested.  Would like for him to be done smoking altogether.     Low testosterone     Lower urinary tract symptoms due to benign prostatic hyperplasia 12/13/2023   Lumbar radiculopathy 10/29/2022   Mild cognitive disorder 12/13/2023   Moderate cigarette smoker (10-19 per day)    Was down to 5 cigarettes a day.   Myocardial infarction (HCC) 03/02/1993   PTCA of RCA  Formatting of this note might be different from the original.  Overview:   PTCA of RCA     Last Assessment & Plan:   His initial MI was related to his RCA, and basically all his cardiac history revolve around his RCA. Now noted to be occluded with scar noted on Myoview  and echocardiogram. Does still have a preserved EF with no further anginal symptoms or heart failure symptoms.  Conti   Nicotine  dependence 12/13/2023   NSTEMI (non-ST elevated myocardial infarction) (HCC) 09/16/2022    Obesity (BMI 30-39.9) 03/12/2013   Formatting of this note might be different from the original.  Last Assessment & Plan:   He has now finally been able to lose a little bit of weight. He is trying to adjust his diet and increase his exercise.  He is now below the obesity threshold.  Formatting of this note might be different from the original.  Last Assessment & Plan:   Stable weight now puts him below the obesity category.     PAD (peripheral artery disease)    Parotid mass 10/23/2018   Polyneuropathy 12/13/2023   Preop cardiovascular exam 04/07/2017   Formatting of this note might be different from the original.  Last Assessment & Plan:   Shaiden is doing very well with no active angina or heart failure symptoms.  He has never had any TIA or stroke symptoms.  Has normal renal function and is nondiabetic.  He has planned wrist surgeries which are both low risk.     Based on ACC-AHA guidelines, he would be considered a low risk patient  for low risk   Right upper quadrant pain 03/04/2016   Sacroiliac joint pain 10/29/2022   ST elevation myocardial infarction (STEMI) of inferior wall, subsequent episode of care (HCC) 03/02/1993   PTCA of RCA; large inferior scar noted on Myoview . Mid inferior and inferoseptal hypokinesis on echocardiogram November 2014. EF 50-55%.   Stenosis of coronary stent 03/02/2010   Mid RCA Occlusion with Right-To-Left and Left-To-Right Collaterals.; Relatively normal LAD, ramus intermedius and circumflex-none.   Stented coronary artery 10/24/2018   Type 2 diabetes mellitus with peripheral artery disease (HCC) 09/14/2023   Type 2 diabetes mellitus without complication, without long-term current use of insulin  (HCC) 01/11/2019    Past Surgical History:  Procedure Laterality Date   ABDOMINAL AORTOGRAM W/LOWER EXTREMITY N/A 12/17/2023   Procedure: ABDOMINAL AORTOGRAM W/LOWER EXTREMITY;  Surgeon: Magda Debby SAILOR, MD;  Location: [facility]AS[facility];  Service: Cardiovascular;   Laterality: N/A;   BACK SURGERY     Lumbar   CARDIAC CATHETERIZATION  05/01/2010   Mid occlusion of RCA with Right to Left and Left to Right collaterals; LAD, ramus and circumflex/OM widely patent   CHOLECYSTECTOMY     CORONARY ANGIOPLASTY  03/02/1993   Inferior STEMI-PTCA of RCA   CORONARY STENT PLACEMENT  03/02/2001   MultiLink Zeta BMS 4.0 mm x 23 mm, 4.0 mm x 15 mm --> Cutting Balloon PTCA for ISR several months later.   CYST REMOVAL NECK     EYE SURGERY     Lasik- Right, Bilateral Cataracts   LEFT HEART CATH AND CORONARY ANGIOGRAPHY N/A 09/17/2022   Procedure: LEFT HEART CATH AND CORONARY ANGIOGRAPHY;  Surgeon: Wendel Lurena POUR, MD;  Location: [facility] INVAS[facility] CV [facility];  Service: Cardiovascular;  Laterality: N/A;   LexiScan  Myoview   10/01/2011   Moderate-severe sized INFERIOR perfusion defect with no evidence of ischemia. EF 55% with distal inferior HK. Large sized Inferior and Inferolateral Infarct with perhaps mild apical inferior ischemia.   LOWER EXTREMITY ANGIOGRAPHY N/A 12/17/2023   Procedure: Lower Extremity Angiography;  Surgeon: Magda Debby SAILOR, MD;  Location: [facility] INVASIVE CV LAB;  Service: Cardiovascular;  Laterality: N/A;   PENILE PROSTHESIS IMPLANT  2017   Three piece penile implant prosthesis   TONSILLECTOMY     TRANSTHORACIC ECHOCARDIOGRAM  01/09/2013   EF 50-55%; normal LV size and function. Probable mild HJ of the mid inferior and inferoseptal wall.; No valvular lesions.    Family History  Problem Relation Age of Onset   Prostatitis Father    Lung cancer Father     Social History   Socioeconomic History   Marital status: Married    Spouse name: Not on file   Number of children: Not on file   Years of education: Not on file   Highest education level: Not on file  Occupational History   Not on file  Tobacco Use   Smoking status: Every Day    Current packs/day: 1.00    Average packs/day: 1 pack/day for 40.0 years (40.0 ttl pk-yrs)    Types: Cigarettes, Pipe    Smokeless tobacco: Never  Vaping Use   Vaping status: Never Used  Substance and Sexual Activity   Alcohol use: No   Drug use: No   Sexual activity: Not on file  Other Topics Concern   Not on file  Social History Narrative   Married father of 3, grandfather 5. Works out occasionally on the treadmill. Coping to try get back into some exercise now. Unfortunately back up to smoking one pack  per day. Does not drink alcohol.      Date from from 5 or 6 cigarettes a day. He's tried electronic cigarettes and down without affect.   Social Drivers of Health   Financial Resource Strain: Medium Risk (10/05/2022)   Received from [facility]   Overall Financial Resource Strain (CARDIA)    Difficulty of Paying Living Expenses: Somewhat hard  Food Insecurity: No Food Insecurity (10/05/2022)   Received from [facility]   Hunger Vital Sign    Within the past 12 months, you worried that your food would run out before you got the money to buy more.: Never true    Within the past 12 months, the food you bought just didn't last and you didn't have money to get more.: Never true  Transportation Needs: No Transportation Needs (10/05/2022)   Received from [facility] - Transportation    Lack of Transportation (Medical): No    Lack of Transportation (Non-Medical): No  Physical Activity: Not on file  Stress: Not on file  Social Connections: Unknown (10/02/2022)   Received from [facility]   Social Network    Social Network: Not on file  Intimate Partner Violence: Unknown (10/02/2022)   Received from [facility]   HITS    Physically Hurt: Not on file    Insult or Talk Down To: Not on file    Threaten Physical Harm: Not on file    Scream or Curse: Not on file    Allergies  Allergen Reactions   Statins Other (See Comments)    Soreness with Crestor    Current Facility-Administered Medications  Medication Dose Route Frequency Provider Last Rate Last Admin   0.9 %  sodium chloride   infusion   Intravenous Continuous Magda Debby SAILOR, MD       ceFAZolin  (ANCEF ) IVPB 2g/100 mL premix  2 g Intravenous 30 min Pre-Op Magda Debby SAILOR, MD       Chlorhexidine Gluconate Cloth 2 % PADS 6 each  6 each Topical Once Deserae Jennings N, MD       And   Chlorhexidine Gluconate Cloth 2 % PADS 6 each  6 each Topical Once Magda Debby SAILOR, MD       insulin  aspart (novoLOG ) injection 0-7 Units  0-7 Units Subcutaneous Q2H PRN Waddell Lauraine NOVAK, MD       lactated ringers infusion   Intravenous Continuous Epifanio Charleston, MD        PHYSICAL EXAM Vitals:   01/06/24 0554  BP: (!) 155/70  Pulse: 77  Resp: 17  Temp: 98.4 F (36.9 C)  TempSrc: Oral  SpO2: 98%  Weight: 83 kg  Height: 5' 8 (1.727 m)     Elderly man in no distress Regular rate and rhythm Unlabored breathing Bilateral femoral pulses are palpable No palpable pedal pulses No ulcers about the feet   PERTINENT LABORATORY AND RADIOLOGIC DATA  Most recent CBC    Latest Ref Rng & Units 01/03/2024    2:00 PM 12/17/2023    7:50 AM 09/17/2022    1:29 PM  CBC  WBC 4.0 - 10.5 K/uL 6.6   5.8   Hemoglobin 13.0 - 17.0 g/dL 85.8  88.7  85.4   Hematocrit 39.0 - 52.0 % 42.5  33.0  43.8   Platelets 150 - 400 K/uL 180   130      Most recent CMP    Latest Ref Rng & Units 01/03/2024    2:00 PM 12/17/2023  7:50 AM 08/24/2023   12:12 PM  CMP  Glucose 70 - 99 mg/dL 870  859  95   BUN 8 - 23 mg/dL 11  18  12    Creatinine 0.61 - 1.24 mg/dL 8.98  8.99  8.85   Sodium 135 - 145 mmol/L 136  140  137   Potassium 3.5 - 5.1 mmol/L 3.9  3.9  5.2   Chloride 98 - 111 mmol/L 101  102  99   CO2 22 - 32 mmol/L 25   23   Calcium  8.9 - 10.3 mg/dL 9.1   9.5   Total Protein 6.5 - 8.1 g/dL 7.1   6.7   Total Bilirubin 0.0 - 1.2 mg/dL 0.5   0.3   Alkaline Phos 38 - 126 U/L 82   68   AST 15 - 41 U/L 23   24   ALT 0 - 44 U/L 20   24     Renal function Estimated Creatinine Clearance: 66.3 mL/min (by C-G formula based on SCr of 1.01  mg/dL).  Hgb A1c MFr Bld (%)  Date Value  01/03/2024 5.6    LDL Chol Calc (NIH)  Date Value Ref Range Status  05/27/2022 96 0 - 99 mg/dL Final   LDL Cholesterol  Date Value Ref Range Status  09/17/2022 83 0 - 99 mg/dL Final    Comment:           Total Cholesterol/HDL:CHD Risk Coronary Heart Disease Risk Table                     Men   Women  1/2 Average Risk   3.4   3.3  Average Risk       5.0   4.4  2 X Average Risk   9.6   7.1  3 X Average Risk  23.4   11.0        Use the calculated Patient Ratio above and the CHD Risk Table to determine the patient's CHD Risk.        ATP III CLASSIFICATION (LDL):  <100     mg/dL   Optimal  899-870  mg/dL   Near or Above                    Optimal  130-159  mg/dL   Borderline  839-810  mg/dL   High  >809     mg/dL   Very High Performed at [facility], [address] [place], [place] [address]    LDL Direct  Date Value Ref Range Status  08/24/2023 37 0 - 99 mg/dL Final     LOWER EXTREMITY DOPPLER STUDY   Patient Name:  RESHAD SAAB  Date of Exam:   11/23/2023  Medical Rec #: [id number]     Accession #:    [id number]  Date of Birth: Mar 30, 1945    Patient Gender: M  Patient Age:   33 years  Exam Location:  [address]  Procedure:      VAS US  ABI WITH/WO TBI  Referring Phys:    ---------------------------------------------------------------------------  -----    Indications: Claudication, and peripheral artery disease.   High Risk Factors: Hypertension, hyperlipidemia, Diabetes, current smoker,                     coronary artery disease.     Performing Technologist: Devere Dark RVT     Examination Guidelines: A complete evaluation includes at minimum, Doppler  waveform signals and systolic blood pressure reading at the level of  bilateral  brachial, anterior tibial, and posterior tibial arteries, when vessel  segments  are accessible. Bilateral testing is considered an integral part of a  complete   examination. Photoelectric Plethysmograph (PPG) waveforms and toe systolic  pressure readings are included as required and additional duplex testing  as  needed. Limited examinations for reoccurring indications may be performed  as  noted.     ABI Findings:  +---------+------------------+-----+----------+--------+  Right   Rt Pressure (mmHg)IndexWaveform  Comment   +---------+------------------+-----+----------+--------+  Brachial 147                                        +---------+------------------+-----+----------+--------+  PTA     81                0.55 monophasic          +---------+------------------+-----+----------+--------+  DP      79                0.54 monophasic          +---------+------------------+-----+----------+--------+  Great Toe56                0.38 Abnormal            +---------+------------------+-----+----------+--------+   +---------+------------------+-----+----------+-------+  Left    Lt Pressure (mmHg)IndexWaveform  Comment  +---------+------------------+-----+----------+-------+  Brachial 147                                       +---------+------------------+-----+----------+-------+  PTA     103               0.70 monophasic         +---------+------------------+-----+----------+-------+  DP      89                0.61 monophasic         +---------+------------------+-----+----------+-------+  [place] Dock                0.59 Abnormal           +---------+------------------+-----+----------+-------+   +-------+-----------+-----------+------------+------------+  ABI/TBIToday's ABIToday's TBIPrevious ABIPrevious TBI  +-------+-----------+-----------+------------+------------+  Right 0.55       0.38       0.56        0.45          +-------+-----------+-----------+------------+------------+  Left  0.70       0.59       0.68        0.54           +-------+-----------+-----------+------------+------------+         Bilateral ABIs appear essentially unchanged. Right ABIs appear decreased.    Summary:  Right: Resting right ankle-brachial index indicates moderate right lower  extremity arterial disease. The right toe-brachial index is abnormal.    Left: Resting left ankle-brachial index indicates moderate left lower  extremity arterial disease. The left toe-brachial index is abnormal.     *See table(s) above for measurements and observations.   Debby SAILOR. Magda, MD [facility] Phone Number: [phone] 01/06/2024 7:14 AM   Total time spent on preparing this encounter including chart review, data review, collecting history, examining the patient, coordinating care for this established patient, 40 minutes  Portions of this report may have been transcribed using voice recognition software.  Every effort has been made to ensure accuracy; however, inadvertent computerized transcription errors may still be present.

## 2024-01-06 NOTE — Transfer of Care (Signed)
 Immediate Anesthesia Transfer of Care Note  Patient: [name] Dickerson  Procedure(s) Performed: CREATION, BYPASS, ARTERIAL, FEMORAL TO FEMORAL, USING GRAFT (Bilateral) ANGIOGRAM, LOWER EXTREMITY Left (Left) INSERTION, ENDOVASCULAR STENT GRAFT, Left Leg SMA  Patient Location: PACU  Anesthesia Type:General  Level of Consciousness: drowsy  Airway & Oxygen  Therapy: Patient connected to face mask oxygen   Post-op Assessment: Report given to RN and Post -op Vital signs reviewed and stable  Post vital signs: Reviewed and stable  Last Vitals:  Vitals Value Taken Time  BP 144/53 01/06/24 13:13  Temp    Pulse 72 01/06/24 13:15  Resp 20 01/06/24 13:15  SpO2 99 % 01/06/24 13:15  Vitals shown include unfiled device data.  Last Pain:  Vitals:   01/06/24 0556  TempSrc:   PainSc: 0-No pain         Complications: There were no known notable events for this encounter.

## 2024-01-06 NOTE — Anesthesia Procedure Notes (Signed)
 Procedure Name: Intubation Date/Time: 01/06/2024 7:58 AM  Performed by: Christopher Comings, CRNAPre-anesthesia Checklist: Patient identified, Emergency Drugs available, Suction available and Patient being monitored Patient Re-evaluated:Patient Re-evaluated prior to induction Oxygen  Delivery Method: Circle system utilized Preoxygenation: Pre-oxygenation with 100% oxygen  Induction Type: IV induction Ventilation: Mask ventilation without difficulty and Oral airway inserted - appropriate to patient size Laryngoscope Size: Mac and 4 Grade View: Grade II Tube type: Oral Tube size: 7.5 mm Number of attempts: 1 Airway Equipment and Method: Stylet and Oral airway Placement Confirmation: ETT inserted through vocal cords under direct vision, positive ETCO2 and breath sounds checked- equal and bilateral Secured at: 23 cm Tube secured with: Tape Dental Injury: Teeth and Oropharynx as per pre-operative assessment

## 2024-01-06 NOTE — Discharge Instructions (Signed)
 [facility]  Discharge instructions  Lower Extremity Bypass Surgery  Please refer to the following instruction for your post-procedure care. Your surgeon or physician assistant will discuss any changes with you.  Activity  You are encouraged to walk as much as you can. You can slowly return to normal activities during the month after your surgery. Avoid strenuous activity and heavy lifting until your doctor tells you it's OK. Avoid activities such as vacuuming or swinging a golf club. Do not drive until your doctor give the OK and you are no longer taking prescription pain medications. It is also normal to have difficulty with sleep habits, eating and bowel movement after surgery. These will go away with time.  Bathing/Showering  Shower daily after you go home. Do not soak in a bathtub, hot tub, or swim until the incision heals completely.  Incision Care  Clean your incision with mild soap and water. Shower every day. Pat the area dry with a clean towel. You do not need a bandage unless otherwise instructed. Do not apply any ointments or creams to your incision. If you have open wounds you will be instructed how to care for them or a visiting nurse may be arranged for you. If you have staples or sutures along your incision they will be removed at your post-op appointment. You may have skin glue on your incision. Do not peel it off. It will come off on its own in about one week.  Wash the groin wound with soap and water daily and pat dry. (No tub bath-only shower)  Then put a dry gauze or washcloth in the groin to keep this area dry to help prevent wound infection.  Do this daily and as needed.  Do not use Vaseline or neosporin on your incisions.  Only use soap and water on your incisions and then protect and keep dry.  Diet  Resume your normal diet. There are no special food restrictions following this procedure. A low fat/ low cholesterol diet is  recommended for all patients with vascular disease. In order to heal from your surgery, it is CRITICAL to get adequate nutrition. Your body requires vitamins, minerals, and protein. Vegetables are the best source of vitamins and minerals. Vegetables also provide the perfect balance of protein. Processed food has little nutritional value, so try to avoid this.  Medications  Resume taking all your medications unless your doctor or physician assistant tells you not to. If your incision is causing pain, you may take over-the-counter pain relievers such as acetaminophen  (Tylenol ). If you were prescribed a stronger pain medication, please aware these medication can cause nausea and constipation. Prevent nausea by taking the medication with a snack or meal. Avoid constipation by drinking plenty of fluids and eating foods with high amount of fiber, such as fruits, vegetables, and grains. Take Colace 100 mg (an over-the-counter stool softener) twice a day as needed for constipation.  Do not take Tylenol  if you are taking prescription pain medications.  Follow Up  Our office will schedule a follow up appointment 2-3 weeks following discharge.  Please call us  immediately for any of the following conditions  Severe or worsening pain in your legs or feet while at rest or while walking Increase pain, redness, warmth, or drainage (pus) from your incision site(s) Fever of 101 degree or higher The swelling in your leg with the bypass suddenly worsens and becomes more painful than when you were in the hospital If you have  been instructed to feel your graft pulse then you should do so every day. If you can no longer feel this pulse, call the office immediately. Not all patients are given this instruction.  Leg swelling is common after leg bypass surgery.  The swelling should improve over a few months following surgery. To improve the swelling, you may elevate your legs above the level of your heart while you are  sitting or resting. Your surgeon or physician assistant may ask you to apply an ACE wrap or wear compression (TED) stockings to help to reduce swelling.  Reduce your risk of vascular disease  Stop smoking. If you would like help call [facility] at 1-800-QUIT-NOW (609-485-9870) or [facility] at 417-855-7064.  Manage your cholesterol Maintain a desired weight Control your diabetes weight Control your diabetes Keep your blood pressure down  If you have any questions, please call the office at [phone]

## 2024-01-06 NOTE — Op Note (Signed)
 DATE OF SERVICE: 01/06/2024  PATIENT:  [name] Dickerson  78 y.o. male  PRE-OPERATIVE DIAGNOSIS:  atherosclerosis of native arteries of bilateral lower extremities causing claudication; right iliac artery occlusion; left superficial femoral artery occlusion  POST-OPERATIVE DIAGNOSIS:  Same  PROCEDURE:   1) left lower extremity angiogram (CPT 75710) 2) left superficial femoral angioplasty and stenting (6x145mm Eluvia x2) CPT 37226 3) left-to-right femoral-femoral bypass with 8mm Dacron (CPT 952 092 0336)  SURGEON:  Surgeons and Role:    * Magda Debby SAILOR, MD - Primary  ASSISTANT: Lucie Apt, PA-C  An experienced assistant was required given the complexity of this procedure and the standard of surgical care. My assistant helped with exposure through counter tension, suctioning, ligation and retraction to better visualize the surgical field.  My assistant expedited sewing during the case by following my sutures. Wherever I use the term we in the report, my assistant actively helped me with that portion of the procedure.  ANESTHESIA:   general  EBL:  BLOOD ADMINISTERED: 2u PRBC  DRAINS: none   LOCAL MEDICATIONS USED:  NONE  SPECIMEN: large left inguinal lymph node   COUNTS: confirmed correct.  TOURNIQUET:  none   PATIENT DISPOSITION:  PACU - hemodynamically stable.   Delay start of Pharmacological VTE agent (>24hrs) due to surgical blood loss or risk of bleeding: no  INDICATION FOR PROCEDURE: [name] Dickerson is a 78 y.o. male with bilateral intermittent claudication. Preoperative angiogram showed right iliac artery occlusion; left superficial femoral artery occlusion. After careful discussion of risks, benefits, and alternatives the patient was offered left-to-right femoral femoral bypass and left SFA stenting. The patient understood and wished to proceed.  OPERATIVE FINDINGS: re-demonstration of SFA occlusion. Fem-pop stenting complicated by stent malfunction during deployment -  the final 2cm did not deploy normally. I was able to salvage this by angioplasty-ing the failed portion open. Femoral-femoral bypass required left femoral endarterectomy. Repair of the posterior wall of the common femoral artery was required after endarterectomy, but not noted until taking the anastomosis down and identifying source of bleeding. Good doppler flow in all femoral vessels at completion. No real change in left femoral flow with graft occlusion; right femoral flow significantly augmented with graft open.   DESCRIPTION OF PROCEDURE: After identification of the patient in the pre-operative holding area, the patient was transferred to the operating room. The patient was positioned supine on the operating room table. Anesthesia was induced. The left leg and bilateral groins were prepped and draped in standard fashion. A surgical pause was performed confirming correct patient, procedure, and operative location.  Intraoperative ultrasound was used to map the course of the common femoral artery bifurcations bilaterally.  Oblique incisions were made in both groins to expose the common femoral artery bifurcations.  The incisions were carried down through subtenons tissue into the femoral sheath was encountered and divided.  Several centimeters of common femoral artery along with the origins of the profunda femoris arteries and superficial femoral arteries were exposed and encircled with Silastic Vesseloops.  A subcutaneous tunnel was created with a Kelly-Wick tunneler to connect the 2 exposures.  An 8 mm dacryon graft was delivered through the tunneler into position for femoral-femoral bypass grafting.  The patient was heparinized with 10,000 units of IV heparin .  Activated clotting time measurements were used as a case to confirm adequate anticoagulation.  Antegrade access was obtained in the left common femoral artery with micropuncture technique.  I initially cannulated the femoral vein likely through  through and  through access.  I reaccessed the artery and angiography confirmed good position in the superficial femoral artery.  Access was upsized to 6 French.  The known superficial femoral artery occlusion was crossed with a Glidewire advantage and a quick cross catheter.  6 x 150 Eluvia stents were then deployed across the area of disease.  The second stent malfunctioned during deployment leaving the proximalmost centimeter 4 to collapsed.  Thankfully, I was able to cross the stent with a balloon catheter and angioplasty the stent system widely patent.  We inspected the delivery catheter with the [facility] [employer] rep and the catheter appeared intact.  Completion angiogram showed resolution of the occlusion with some atherosclerotic change proximal and distal to the stenting.  Clamps were applied to the left femoral artery system.  The left common femoral arteriotomy was then opened widely to allow femoral-femoral bypass grafting.  Endarterectomy was required to allow satisfactory anastomosis.  This was performed with standard technique developing a medial plane and excising the plaque at the common femoral bifurcation.  The donor limb of the femoral-femoral limb was then spatulated and anastomosed end to side to the common femoral arteriotomy.  This was done with continuous running suture of 5-0 Prolene.  Clamps were released and bleeding was encountered.  This seemed to be from the backwall of the common femoral artery.  Interrupted sutures were attempted.  I ultimately packed the wound and found it to be hemostatic.  Attention was turned to the right groin.  Clamps were applied to the right femoral artery system.  The right common femoral was opened with an 11 blade and extended with Potts scissors.  No endarterectomy was required on the side.  The other limb of the femoral-femoral bypass graft was then spatulated and anastomosed end to side to the right common femoral artery using continuous running  suture of 5-0 Prolene.  This anastomosis was found to be perfectly hemostatic.  Attention was returned to the left femoral system.  Packing was removed and bleeding returned.  I was not able to achieve hemostasis with external suturing and so I reapplied clamps to the left femoral arteries and took the left femoral anastomosis off the arteriotomy.  After some probing, I found a small arteriotomy in the posterior wall of the common femoral artery.  This was repaired with interrupted 6-0 Prolene sutures with good result.  The anastomosis was reperformed with continuous running suture of 5-0 Prolene.  After this, hemostasis was achieved.  Doppler flow was confirmed in all of the femoral vessels.  The right femoral vessels augmented with graft open.  The left femoral vessels did not change with the graft open or close.  Repeat angiogram was performed of the superficial femoral artery stenting and it was found to be patent.  Satisfied we ended the case here.  Heparin  was reversed with protamine.  The wound was closed in layers using 2-0 Vicryl, 3-0 Vicryl, 4-0 Monocryl.  Upon completion of the case instrument and sharps counts were confirmed correct. The patient was transferred to the PACU in good condition. I was present for all portions of the procedure.  FOLLOW UP PLAN: Assuming a normal postoperative course, I will see the patient in 4 weeks with ABI, LLE arterial duplex, and fem-fem duplex.   Debby SAILOR. Magda, MD [facility] Phone Number: [phone] 01/06/2024 1:25 PM

## 2024-01-06 NOTE — Progress Notes (Signed)
 Patient brought to 4E from PACU. Telemetry box applied, CCMD notified. Patient oriented to room and staff. Call bell in reach.   01/06/24 1421  Vitals  Temp 97.6 F (36.4 C)  Temp Source Oral  BP (!) 144/78  MAP (mmHg) 91  BP Location Left Arm  BP Method Automatic  Patient Position (if appropriate) Lying  Pulse Rate 74  Pulse Rate Source Monitor  ECG Heart Rate 74  Resp 16  MEWS COLOR  MEWS Score Color Green  Oxygen  Therapy  SpO2 94 %  O2 Device Room Air  MEWS Score  MEWS Temp 0  MEWS Systolic 0  MEWS Pulse 0  MEWS RR 0  MEWS LOC 1  MEWS Score 1

## 2024-01-07 ENCOUNTER — Encounter (HOSPITAL_COMMUNITY): Payer: Self-pay | Admitting: Vascular Surgery

## 2024-01-07 ENCOUNTER — Other Ambulatory Visit: Payer: Self-pay | Admitting: *Deleted

## 2024-01-07 DIAGNOSIS — I739 Peripheral vascular disease, unspecified: Secondary | ICD-10-CM

## 2024-01-07 DIAGNOSIS — I70213 Atherosclerosis of native arteries of extremities with intermittent claudication, bilateral legs: Secondary | ICD-10-CM

## 2024-01-07 DIAGNOSIS — Z48812 Encounter for surgical aftercare following surgery on the circulatory system: Secondary | ICD-10-CM

## 2024-01-07 DIAGNOSIS — Z95828 Presence of other vascular implants and grafts: Secondary | ICD-10-CM

## 2024-01-07 DIAGNOSIS — Z95820 Peripheral vascular angioplasty status with implants and grafts: Secondary | ICD-10-CM

## 2024-01-07 LAB — TYPE AND SCREEN
ABO/RH(D): O POS
Antibody Screen: POSITIVE
PT AG Type: NEGATIVE
Unit division: 0
Unit division: 0

## 2024-01-07 LAB — LIPID PANEL
Cholesterol: 64 mg/dL (ref 0–200)
HDL: 23 mg/dL — ABNORMAL LOW (ref >40)
LDL Cholesterol: 23 mg/dL (ref 0–99)
Total CHOL/HDL Ratio: 2.8 ratio
Triglycerides: 91 mg/dL (ref <150)
VLDL: 18 mg/dL (ref 0–40)

## 2024-01-07 LAB — BASIC METABOLIC PANEL WITH GFR
Anion gap: 9 (ref 5–15)
BUN: 8 mg/dL (ref 8–23)
CO2: 24 mmol/L (ref 22–32)
Calcium: 7.6 mg/dL — ABNORMAL LOW (ref 8.9–10.3)
Chloride: 102 mmol/L (ref 98–111)
Creatinine, Ser: 1.09 mg/dL (ref 0.61–1.24)
GFR, Estimated: >60 mL/min (ref >60)
Glucose, Bld: 120 mg/dL — ABNORMAL HIGH (ref 70–99)
Potassium: 4 mmol/L (ref 3.5–5.1)
Sodium: 135 mmol/L (ref 135–145)

## 2024-01-07 LAB — CBC
HCT: 28.8 % — ABNORMAL LOW (ref 39.0–52.0)
Hemoglobin: 9.6 g/dL — ABNORMAL LOW (ref 13.0–17.0)
MCH: 29.6 pg (ref 26.0–34.0)
MCHC: 33.3 g/dL (ref 30.0–36.0)
MCV: 88.9 fL (ref 80.0–100.0)
Platelets: 124 K/uL — ABNORMAL LOW (ref 150–400)
RBC: 3.24 MIL/uL — ABNORMAL LOW (ref 4.22–5.81)
RDW: 13.9 % (ref 11.5–15.5)
WBC: 7 K/uL (ref 4.0–10.5)
nRBC: 0 % (ref 0.0–0.2)

## 2024-01-07 LAB — BPAM RBC
Blood Product Expiration Date: 202512072359
Blood Product Unit Number: 202512072359
ISSUE DATE / TIME: 202511061144
PRODUCT CODE: 202511061144
PRODUCT CODE: 202512072359
Unit Type and Rh: 202512072359
Unit Type and Rh: 5100
Unit Type and Rh: 5100
Unit Type and Rh: 5100

## 2024-01-07 MED ORDER — OXYCODONE-ACETAMINOPHEN 5-325 MG PO TABS: 1.0000 | ORAL_TABLET | Freq: Four times a day (QID) | ORAL | 0 refills | Status: AC | PRN

## 2024-01-07 MED ORDER — ASPIRIN 81 MG PO TBEC: 81.0000 mg | DELAYED_RELEASE_TABLET | Freq: Every day | ORAL | 0 refills | Status: AC

## 2024-01-07 NOTE — Progress Notes (Signed)
 Reviewed AVS, patient expressed understanding of medications, MD follow up reviewed.    Removed IV, Site clean, dry and intact.   See LDA for information on wounds at discharge.  CCMD contacted and informed patients is being discharged.   Patient states all belongings brought to the hospital at time of admission are accounted for and packed to take home.   Patient informed and expressed understanding where to pick up discharge medications.   Lead Transport contacted to transport patient to Discharge lounge to wait for transportation home.

## 2024-01-07 NOTE — Evaluation (Signed)
 Occupational Therapy Evaluation and Discharge Patient Details Name: [name] Dickerson MRN: [id number] DOB: 06-07-1945 Today's Date: 01/07/2024   History of Present Illness   [name] Dickerson is a 78 y.o. male with cramping discomfort in legs with walking. The patient also describes back pain which radiates down the legs and is worse with standing. 11/6 pt underwent left-to-right femoral-femoral bypass PHMx: OA, CAD, chronic cholecystitis, MI, HTN, anxiety, DM2. MCI, obesity, PAD, SI pain     Clinical Impressions This 77 yo male admitted and underwent above presents to acute OT with all education completed and pt is at an independent to Mod I level for all basic ADLs, mobility, and steps with one rail. No further OT needs and no PT needs identified, OT will sign off.      Functional Status Assessment   Patient has had a recent decline in their functional status and demonstrates the ability to make significant improvements in function in a reasonable and predictable amount of time. (moving slower--no further OT needs)     Equipment Recommendations   None recommended by OT      Precautions/Restrictions   Precautions Precautions: None Restrictions Weight Bearing Restrictions Per Provider Order: No     Mobility Bed Mobility Overal bed mobility: Independent                  Transfers Overall transfer level: Independent                 General transfer comment: Mod I up and down steps ( up steps he did a step over step pattern, down steps he did a step-to pattern leading with RLE)      Balance Overall balance assessment: Mild deficits observed, not formally tested                                         ADL either performed or assessed with clinical judgement   ADL Overall ADL's : Independent;Modified independent                                             Vision Patient Visual Report: No change from baseline               Pertinent Vitals/Pain Pain Assessment Pain Assessment: Faces Faces Pain Scale: Hurts a little bit Pain Location: Bil groin Pain Descriptors / Indicators: Sore Pain Intervention(s): Limited activity within patient's tolerance, Monitored during session, Repositioned     Extremity/Trunk Assessment Upper Extremity Assessment Upper Extremity Assessment: Overall WFL for tasks assessed           Communication Communication Communication: No apparent difficulties   Cognition Arousal: Alert Behavior During Therapy: WFL for tasks assessed/performed Cognition: No apparent impairments                               Following commands: Intact                  Home Living Family/patient expects to be discharged to:: Private residence Living Arrangements: Spouse/significant other (has Alzheimers--family take care of her in their home) Available Help at Discharge: Family;Available 24 hours/day Type of Home: House Home Access: Level entry     Home Layout: Two level  Alternate Level Stairs-Number of Steps: 6 landing 6 Alternate Level Stairs-Rails: Right Bathroom Shower/Tub: Producer, Television/film/video: Handicapped height     Home Equipment: None          Prior Functioning/Environment Prior Level of Function : Independent/Modified Independent                    OT Problem List: Decreased range of motion;Impaired balance (sitting and/or standing);Pain        OT Goals(Current goals can be found in the care plan section)   Acute Rehab OT Goals Patient Stated Goal: to go home today         AM-PAC OT 6 Clicks Daily Activity     Outcome Measure Help from another person eating meals?: None Help from another person taking care of personal grooming?: None Help from another person toileting, which includes using toliet, bedpan, or urinal?: None Help from another person bathing (including washing, rinsing, drying)?: None Help from another  person to put on and taking off regular upper body clothing?: None Help from another person to put on and taking off regular lower body clothing?: None 6 Click Score: 24   End of Session Equipment Utilized During Treatment: Gait belt Nurse Communication:  (no further OT needs and no PT needs (via secure chat))  Activity Tolerance: Patient tolerated treatment well Patient left: in bed  OT Visit Diagnosis: Other abnormalities of gait and mobility (R26.89);Pain Pain - Right/Left:  (both) Pain - part of body:  (groin)                Time: 9176-9162 OT Time Calculation (min): 14 min Charges:  OT General Charges $OT Visit: 1 Visit OT Evaluation $OT Eval Moderate Complexity: 1 Mod  Cathy L. OT Acute Rehabilitation Services Office [phone]    Rodgers Dorothyann Distel 01/07/2024, 8:45 AM

## 2024-01-07 NOTE — Progress Notes (Addendum)
  Progress Note    01/07/2024 7:57 AM 1 Day Post-Op  Subjective:  no complaints.  Wants to go home   Vitals:   01/07/24 0017 01/07/24 0517  BP:  131/61  Pulse:    Resp:    Temp: 98.1 F (36.7 C) 98.3 F (36.8 C)  SpO2: 90% 97%   Physical Exam: Lungs:  non labored Incisions:  groin incisions c/d/i Extremities:  palpable L PT; R PT and DP brisk by doppler Neurologic: a&O  CBC    Component Value Date/Time   WBC 7.0 01/07/2024 0246   RBC 3.24 (L) 01/07/2024 0246   HGB 9.6 (L) 01/07/2024 0246   HCT 28.8 (L) 01/07/2024 0246   PLT 124 (L) 01/07/2024 0246   MCV 88.9 01/07/2024 0246   MCH 29.6 01/07/2024 0246   MCHC 33.3 01/07/2024 0246   RDW 13.9 01/07/2024 0246   LYMPHSABS 1.8 09/16/2022 1841   MONOABS 0.5 09/16/2022 1841   EOSABS 0.1 09/16/2022 1841   BASOSABS 0.0 09/16/2022 1841    BMET    Component Value Date/Time   NA 135 01/07/2024 0246   NA 137 08/24/2023 1212   K 4.0 01/07/2024 0246   CL 102 01/07/2024 0246   CO2 24 01/07/2024 0246   GLUCOSE 120 (H) 01/07/2024 0246   BUN 8 01/07/2024 0246   BUN 12 08/24/2023 1212   CREATININE 1.09 01/07/2024 0246   CALCIUM  7.6 (L) 01/07/2024 0246   GFRNONAA >60 01/07/2024 0246   GFRAA 85 05/11/2018 0820    INR    Component Value Date/Time   INR 1.0 01/03/2024 1400     Intake/Output Summary (Last 24 hours) at 01/07/2024 0757 Last data filed at 01/06/2024 2306 Gross per 24 hour  Intake 4050 ml  Output 2700 ml  Net 1350 ml     Assessment/Plan:  78 y.o. male is s/p L to R fem fem bypass with L SFA stenting  1 Day Post-Op   BLE well perfused on exam Groin incisions are well appearing Patient has ambulated in the halls this morning and is feeling ready for discharge home He will continue daily aspirin  and plavix  Home today vs tomorrow pending evaluation by Dr. Magda Donnice Sender, PA-C [facility] [phone] 01/07/2024 7:57 AM  VASCULAR STAFF ADDENDUM: I have independently  interviewed and examined the patient. I agree with the above.  Looks great. No issues with walking. Safe for discharge. Follow up with me in 1 month with ABI, fem-fem duplex, LLE arterial duplex. ASA / Plavix  / Statin therapies.  Debby SAILOR. Magda, MD [facility] Phone Number: [phone] 01/07/2024 11:07 AM

## 2024-01-07 NOTE — Anesthesia Postprocedure Evaluation (Signed)
 Anesthesia Post Note  Patient: [name] Dickerson  Procedure(s) Performed: CREATION, BYPASS, ARTERIAL, FEMORAL TO FEMORAL, USING GRAFT (Bilateral) ANGIOGRAM, LOWER EXTREMITY Left (Left) INSERTION, ENDOVASCULAR STENT GRAFT, Left Leg SMA     Patient location during evaluation: PACU Anesthesia Type: General Level of consciousness: awake Pain management: pain level controlled Vital Signs Assessment: post-procedure vital signs reviewed and stable Respiratory status: spontaneous breathing Cardiovascular status: blood pressure returned to baseline Postop Assessment: no apparent nausea or vomiting Anesthetic complications: no   There were no known notable events for this encounter.                  Lauraine KATHEE [place]

## 2024-01-07 NOTE — Progress Notes (Signed)
 PT Cancellation Note  Patient Details Name: BODIE ABERNETHY MRN: [id number] DOB: [birth year]   Cancelled Treatment:    Reason Eval/Treat Not Completed: PT screened, no needs identified, will sign off   Stephane JULIANNA Bevel 01/07/2024, 8:46 AM Wray Goehring M,PT Acute Rehab Services [phone]

## 2024-01-07 NOTE — Discharge Summary (Signed)
 Discharge Summary  Patient ID: [name] Dickerson [id number] 78 y.o. 07-Jan-1946  Admit date: 01/06/2024  Discharge date and time: 01/07/2024 11:31 AM   Admitting Physician: Debby LOISE Robertson, MD   Discharge Physician: same  Admission Diagnoses: Atherosclerosis of native artery of both lower extremities with intermittent claudication [I70.213] PAD (peripheral artery disease) [I73.9]  Discharge Diagnoses: same  Admission Condition: fair  Discharged Condition: good  Indication for Admission: post op care  Hospital Course: [name] Dickerson is a 78 year old male who was brought in as an outpatient and underwent left to right femoral to femoral bypass with left SFA stenting by Dr. Robertson on 01/06/2024.  He tolerated the procedure well and was admitted to the hospital postoperatively.  It should also be noted he received 2 units of packed red blood cells intraoperatively.  Postoperative day #1 [name] Dickerson was feeling ready for discharge home.  He was ambulating in the halls without difficulty.  His groin incisions were well-appearing without hematoma.  On exam he had a palpable left PT pulse and brisk Doppler signals in the right DP and PT position.  He will follow-up in the office with a left lower extremity arterial duplex, femorofemoral duplex, and ABI in 4 weeks.  He will continue his aspirin , Plavix , statin daily.  He was prescribed 1 to 2 days of narcotic pain medication for continued postoperative pain control.  He was discharged home in stable condition.  Consults: None  Treatments: surgery: Left to right femoral to femoral bypass with left SFA stenting by Dr. Robertson on 01/06/2024  Discharge Exam: See progress note 01/07/24 Vitals:   01/07/24 0517 01/07/24 0919  BP: 131/61 128/60  Pulse:  87  Resp:  18  Temp: 98.3 F (36.8 C) 98.1 F (36.7 C)  SpO2: 97% 94%     Disposition: Discharge disposition: 01-Home or Self Care       Patient Instructions:  Allergies as of 01/07/2024        Reactions   Statins Other (See Comments)   Soreness with Crestor        Medication List     TAKE these medications    ALPRAZolam  0.5 MG tablet Commonly known as: XANAX  Take 0.5 mg by mouth at bedtime.   amLODipine  5 MG tablet Commonly known as: NORVASC  Take 1 tablet (5 mg total) by mouth daily.   aspirin  EC 81 MG tablet Take 1 tablet (81 mg total) by mouth daily. Swallow whole.   B-complex with vitamin C tablet Take 1 tablet by mouth daily.   clopidogrel  75 MG tablet Commonly known as: PLAVIX  Take 1 tablet (75 mg total) by mouth daily with breakfast. What changed: when to take this   FOLIC ACID PO Take 1 tablet by mouth daily.   furosemide  20 MG tablet Commonly known as: LASIX  Take 20 mg by mouth 3 (three) times a week. Tuesdays, Thursdays & Saturdays.   gabapentin 300 MG capsule Commonly known as: NEURONTIN Take 300 mg by mouth at bedtime.   GLUCOSAMINE PO Take 1 tablet by mouth daily.   levothyroxine  175 MCG tablet Commonly known as: SYNTHROID  Take 175 mcg by mouth daily before breakfast.   nitroGLYCERIN  0.4 MG SL tablet Commonly known as: NITROSTAT  Place 1 tablet (0.4 mg total) under the tongue every 5 (five) minutes as needed for chest pain.   oxyCODONE-acetaminophen  5-325 MG tablet Commonly known as: PERCOCET/ROXICET Take 1 tablet by mouth every 6 (six) hours as needed for moderate pain (pain score 4-6).   Ozempic (  1 MG/DOSE) 4 MG/3ML Sopn Generic drug: Semaglutide (1 MG/DOSE) Inject 1 mg into the skin every Sunday.   Repatha  SureClick 140 MG/ML Soaj Generic drug: Evolocumab  INJECT 140 MG INTO THE SKIN EVERY 14 (FOURTEEN) DAYS.   tamsulosin 0.4 MG Caps capsule Commonly known as: FLOMAX Take 0.4 mg by mouth 3 (three) times a week.       Activity: activity as tolerated Diet: regular diet Wound Care: keep wound clean and dry  Follow-up with [facility] in 4 weeks.  Signed: Donnice Sender, PA-C 01/07/2024 1:27 PM [facility] Office: [phone]

## 2024-01-11 ENCOUNTER — Emergency Department (HOSPITAL_COMMUNITY)

## 2024-01-11 ENCOUNTER — Other Ambulatory Visit: Payer: Self-pay

## 2024-01-11 ENCOUNTER — Emergency Department (HOSPITAL_COMMUNITY)
Admission: EM | Admit: 2024-01-11 | Discharge: 2024-01-11 | Disposition: A | Discharge status: Home / Self Care | Attending: Emergency Medicine | Admitting: Emergency Medicine

## 2024-01-11 ENCOUNTER — Encounter (HOSPITAL_COMMUNITY): Payer: Self-pay

## 2024-01-11 DIAGNOSIS — Z79899 Other long term (current) drug therapy: Secondary | ICD-10-CM | POA: Insufficient documentation

## 2024-01-11 DIAGNOSIS — Z7982 Long term (current) use of aspirin: Secondary | ICD-10-CM | POA: Diagnosis not present

## 2024-01-11 DIAGNOSIS — E1165 Type 2 diabetes mellitus with hyperglycemia: Secondary | ICD-10-CM | POA: Diagnosis not present

## 2024-01-11 DIAGNOSIS — I10 Essential (primary) hypertension: Secondary | ICD-10-CM | POA: Diagnosis not present

## 2024-01-11 DIAGNOSIS — E039 Hypothyroidism, unspecified: Secondary | ICD-10-CM | POA: Insufficient documentation

## 2024-01-11 DIAGNOSIS — I2510 Atherosclerotic heart disease of native coronary artery without angina pectoris: Secondary | ICD-10-CM | POA: Diagnosis not present

## 2024-01-11 DIAGNOSIS — N433 Hydrocele, unspecified: Secondary | ICD-10-CM | POA: Diagnosis not present

## 2024-01-11 DIAGNOSIS — N5089 Other specified disorders of the male genital organs: Secondary | ICD-10-CM | POA: Diagnosis present

## 2024-01-11 DIAGNOSIS — Z7901 Long term (current) use of anticoagulants: Secondary | ICD-10-CM | POA: Insufficient documentation

## 2024-01-11 DIAGNOSIS — F17210 Nicotine dependence, cigarettes, uncomplicated: Secondary | ICD-10-CM | POA: Insufficient documentation

## 2024-01-11 LAB — CBC WITH DIFFERENTIAL/PLATELET
Abs Immature Granulocytes: 0.01 K/uL (ref 0.00–0.07)
Basophils Absolute: 0.1 K/uL (ref 0.0–0.1)
Basophils Relative: 1 %
Eosinophils Absolute: 0.2 K/uL (ref 0.0–0.5)
Eosinophils Relative: 4 %
HCT: 30.5 % — ABNORMAL LOW (ref 39.0–52.0)
Hemoglobin: 10.4 g/dL — ABNORMAL LOW (ref 13.0–17.0)
Immature Granulocytes: 0 %
Lymphocytes Relative: 24 %
Lymphs Abs: 1.4 K/uL (ref 0.7–4.0)
MCH: 30.1 pg (ref 26.0–34.0)
MCHC: 34.1 g/dL (ref 30.0–36.0)
MCV: 88.2 fL (ref 80.0–100.0)
Monocytes Absolute: 0.5 K/uL (ref 0.1–1.0)
Monocytes Relative: 9 %
Neutro Abs: 3.6 K/uL (ref 1.7–7.7)
Neutrophils Relative %: 62 %
Platelets: 180 K/uL (ref 150–400)
RBC: 3.46 MIL/uL — ABNORMAL LOW (ref 4.22–5.81)
RDW: 13.8 % (ref 11.5–15.5)
WBC: 5.7 K/uL (ref 4.0–10.5)
nRBC: 0 % (ref 0.0–0.2)

## 2024-01-11 LAB — I-STAT CG4 LACTIC ACID, ED: Lactic Acid, Venous: 0.7 mmol/L (ref 0.5–1.9)

## 2024-01-11 LAB — COMPREHENSIVE METABOLIC PANEL WITH GFR
ALT: 18 U/L (ref 0–44)
AST: 23 U/L (ref 15–41)
Albumin: 3.6 g/dL (ref 3.5–5.0)
Alkaline Phosphatase: 73 U/L (ref 38–126)
Anion gap: 14 (ref 5–15)
BUN: 10 mg/dL (ref 8–23)
CO2: 24 mmol/L (ref 22–32)
Calcium: 8.9 mg/dL (ref 8.9–10.3)
Chloride: 98 mmol/L (ref 98–111)
Creatinine, Ser: 1.04 mg/dL (ref 0.61–1.24)
GFR, Estimated: >60 mL/min (ref >60)
Glucose, Bld: 128 mg/dL — ABNORMAL HIGH (ref 70–99)
Potassium: 3.4 mmol/L — ABNORMAL LOW (ref 3.5–5.1)
Sodium: 136 mmol/L (ref 135–145)
Total Bilirubin: 0.5 mg/dL (ref 0.0–1.2)
Total Protein: 6.7 g/dL (ref 6.5–8.1)

## 2024-01-11 LAB — SURGICAL PATHOLOGY

## 2024-01-11 NOTE — ED Notes (Signed)
 Per provider second lactic not needed. First lactic 0.7.

## 2024-01-11 NOTE — ED Provider Notes (Signed)
 [place] AT [facility] Provider Note   CSN: [id number] Arrival date & time: 01/11/24  1258     Patient presents with: No chief complaint on file.   [name] Dickerson is a 78 y.o. male.  {Add pertinent medical, surgical, social history, OB history to HPI:32947} HPI     78 year old male with history of hypertension, coronary artery disease, type 2 diabetes, hypothyroidism, tobacco abuse, dyslipidemia, peripheral arterial disease status post femorofemoral bypass left right, left superficial femoral angioplasty and stenting  Swelling increased since the 6th Not true pain, just discomfort related to swelling  Was walking and on feet a lot until this AM Went home on Friday Her daughter was supposed to be staying with her but had seizure No fever  Right groin discomfort    Past Medical History:  Diagnosis Date   Arthritis 10/24/2018   Formatting of this note might be different from the original.  Back  Formatting of this note might be different from the original.  Back     Atherosclerotic heart disease of native coronary artery without angina pectoris 11/23/2022   CAD S/P percutaneous coronary angioplasty 1995, 2003   Inferior STEMI-PTCA RCA; 2003: Overlapping MultiLink Zeta BMS -RCA (4.0 x 23, 4.0 x 15); 09/2001 Cutting Balloon PTCA of stents for ISR.;; Myoview  August 2013 - Mod-severe sized fixed INFERIOR perfusion defect => likely infarct with mild peri-infarct ischemia in the apical inferior wall, EF 55% with distal inferior hypokinesis   Carotid artery stenosis    Cholecystitis, chronic 03/30/2016   Cigarette smoker 10/24/2018   Coronary artery disease, occlusive 04/08/2011   Inferior STEMI-PTCA RCA; 2003: overlapping MultiLink Zeta BMS -RCA (4.0 mm x 23 mm, 4.0 mm x 15 mm); August 2003 Cutting Balloon PTCA of stents for ISR.;; Negative Myoview  August 2013  Formatting of this note might be different from the original.  Overview:   Inferior STEMI-PTCA  RCA; 2003: overlapping MultiLink Zeta BMS -RCA (4.0 mm x 23 mm, 4.0 mm x 15 mm); August 2003 Cutting Balloon PTCA of ste   Disease of thyroid  gland 10/24/2018   Essential hypertension    Foot pain, bilateral 09/14/2023   Generalized anxiety disorder 12/13/2023   Hip pain 07/04/2021   Hyperglycemia due to type 2 diabetes mellitus (HCC) 12/13/2023   Hyperlipidemia LDL goal <70    Hyperlipidemia, unspecified 03/04/2016   Statin intolerance. Monitored by PCP.  Formatting of this note might be different from the original.  Overview:   Statin intolerance. Monitored by PCP.     Last Assessment & Plan:   Myalgias, fatigue and laziness wall taking statins. He is on niacin.  He is due to get labs checked by PCP. Provided they're stable with lipid levels at goal range, continue without more aggressive treatment. If not, I   Hypothyroidism    Idiopathic osteoarthritis 12/13/2023   Intermittent claudication due to atherosclerosis of artery of extremity 09/14/2023   Light cigarette smoker (1-9 cigarettes per day) 10/24/2018   Was down to 5 cigarettes a day.  Formatting of this note might be different from the original.  Was down to 5 cigarettes a day.     Last Assessment & Plan:   Pretty much stable at 5 cigarettes a day.  I told him about the need to finally quit but he is not interested.  Would like for him to be done smoking altogether.     Low testosterone     Lower urinary tract symptoms due to benign prostatic hyperplasia  12/13/2023   Lumbar radiculopathy 10/29/2022   Mild cognitive disorder 12/13/2023   Moderate cigarette smoker (10-19 per day)    Was down to 5 cigarettes a day.   Myocardial infarction (HCC) 03/02/1993   PTCA of RCA  Formatting of this note might be different from the original.  Overview:   PTCA of RCA     Last Assessment & Plan:   His initial MI was related to his RCA, and basically all his cardiac history revolve around his RCA. Now noted to be occluded with scar noted on Myoview  and  echocardiogram. Does still have a preserved EF with no further anginal symptoms or heart failure symptoms.  Conti   Nicotine  dependence 12/13/2023   NSTEMI (non-ST elevated myocardial infarction) (HCC) 09/16/2022   Obesity (BMI 30-39.9) 03/12/2013   Formatting of this note might be different from the original.  Last Assessment & Plan:   He has now finally been able to lose a little bit of weight. He is trying to adjust his diet and increase his exercise.  He is now below the obesity threshold.  Formatting of this note might be different from the original.  Last Assessment & Plan:   Stable weight now puts him below the obesity category.     PAD (peripheral artery disease)    Parotid mass 10/23/2018   Polyneuropathy 12/13/2023   Preop cardiovascular exam 04/07/2017   Formatting of this note might be different from the original.  Last Assessment & Plan:   Kane is doing very well with no active angina or heart failure symptoms.  He has never had any TIA or stroke symptoms.  Has normal renal function and is nondiabetic.  He has planned wrist surgeries which are both low risk.     Based on ACC-AHA guidelines, he would be considered a low risk patient for low risk   Right upper quadrant pain 03/04/2016   Sacroiliac joint pain 10/29/2022   ST elevation myocardial infarction (STEMI) of inferior wall, subsequent episode of care (HCC) 03/02/1993   PTCA of RCA; large inferior scar noted on Myoview . Mid inferior and inferoseptal hypokinesis on echocardiogram November 2014. EF 50-55%.   Stenosis of coronary stent 03/02/2010   Mid RCA Occlusion with Right-To-Left and Left-To-Right Collaterals.; Relatively normal LAD, ramus intermedius and circumflex-none.   Stented coronary artery 10/24/2018   Type 2 diabetes mellitus with peripheral artery disease (HCC) 09/14/2023   Type 2 diabetes mellitus without complication, without long-term current use of insulin  (HCC) 01/11/2019     Prior to Admission medications    Medication Sig Start Date End Date Taking? Authorizing Provider  ALPRAZolam  (XANAX ) 0.5 MG tablet Take 0.5 mg by mouth at bedtime. 08/18/19   [provider]  amLODipine  (NORVASC ) 5 MG tablet Take 1 tablet (5 mg total) by mouth daily. Patient not taking: Reported on 12/30/2023 12/15/23 03/14/24  Carlin Delon BROCKS, NP  aspirin  EC 81 MG tablet Take 1 tablet (81 mg total) by mouth daily. Swallow whole. 01/07/24 01/06/25  Bethanie Cough, PA-C  B Complex-C (B-COMPLEX WITH VITAMIN C) tablet Take 1 tablet by mouth daily.    [provider]  clopidogrel  (PLAVIX ) 75 MG tablet Take 1 tablet (75 mg total) by mouth daily with breakfast. Patient taking differently: Take 75 mg by mouth 2 (two) times a week. 12/15/23   Carlin Delon BROCKS, NP  Evolocumab  (REPATHA  SURECLICK) 140 MG/ML SOAJ INJECT 140 MG INTO THE SKIN EVERY 14 (FOURTEEN) DAYS. 12/13/23   Krasowski, Robert J, MD  FOLIC ACID PO Take 1 tablet by mouth daily.    [provider]  furosemide  (LASIX ) 20 MG tablet Take 20 mg by mouth 3 (three) times a week. Tuesdays, Thursdays & Saturdays.    [provider]  gabapentin (NEURONTIN) 300 MG capsule Take 300 mg by mouth at bedtime.    [provider]  Glucosamine HCl (GLUCOSAMINE PO) Take 1 tablet by mouth daily.    [provider]  levothyroxine  (SYNTHROID ) 175 MCG tablet Take 175 mcg by mouth daily before breakfast.    [provider]  nitroGLYCERIN  (NITROSTAT ) 0.4 MG SL tablet Place 1 tablet (0.4 mg total) under the tongue every 5 (five) minutes as needed for chest pain. 09/16/22   Krasowski, Robert J, MD  oxyCODONE-acetaminophen  (PERCOCET/ROXICET) 5-325 MG tablet Take 1 tablet by mouth every 6 (six) hours as needed for moderate pain (pain score 4-6). 01/07/24   Bethanie Cough, PA-C  Semaglutide, 1 MG/DOSE, (OZEMPIC, 1 MG/DOSE,) 4 MG/3ML SOPN Inject 1 mg into the skin every Sunday. 11/25/22   [provider]  tamsulosin (FLOMAX) 0.4 MG  CAPS capsule Take 0.4 mg by mouth 3 (three) times a week.    [provider]    Allergies: Statins    Review of Systems  Updated Vital Signs BP 137/63 (BP Location: Right Arm)   Pulse 73   Temp 98.5 F (36.9 C) (Oral)   Resp 18   Ht 5' 8 (1.727 m)   Wt 83 kg   SpO2 97%   BMI 27.83 kg/m   Physical Exam  (all labs ordered are listed, but only abnormal results are displayed) Labs Reviewed - No data to display  EKG: None  Radiology: US  SCROTUM W/DOPPLER Result Date: 01/11/2024 EXAM: ULTRASOUND SCROTUM/TESTICLES WITH DOPPLER FLOW EVALUATION 01/11/2024 03:22:02 PM TECHNIQUE: Duplex ultrasound using B-mode/gray scaled imaging, Doppler spectral analysis and color flow Doppler was obtained of the testicles. COMPARISON: None available. CLINICAL HISTORY: 223165 Scrotal edema 223165 Scrotal edema FINDINGS: RIGHT: MEASUREMENTS: Right testicle measures 3.5 x 2.9 x 1.9 cm. GREY SCALE: The right testicle demonstrates normal homogeneous echotexture without focal lesion. No testicular microlithiasis. DOPPLER EVALUATION: There is normal arterial and venous Doppler flow within the testicle. VARICOCELE: No scrotal varicocele. SCROTAL SAC: A hydrocele is noted. Extensive scrotal wall thickening is noted. A penile pump is noted in the right scrotal region. EPIDIDYMIS: No acute abnormality. LEFT: MEASUREMENTS: Left testicle measures 3.0 x 2.4 x 2.3 cm. GREY SCALE: The left testicle demonstrates normal homogeneous echotexture without focal lesion. No testicular microlithiasis. DOPPLER EVALUATION: There is normal arterial and venous Doppler flow within the testicle. VARICOCELE: No scrotal varicocele. SCROTAL SAC: A hydrocele is noted. Extensive scrotal wall thickening is noted. A 6.3 x 3.1 x 2.7 cm mildly complex fluid collection is seen lateral to the left testicle of uncertain etiology; abscess cannot be excluded. EPIDIDYMIS: No acute abnormality. IMPRESSION: 1. Extensive bilateral scrotal wall  thickening. 2. Bilateral hydroceles. 3. Mildly complex fluid collection lateral to the left testicle, with abscess not excluded. Electronically signed by: Lynwood Seip MD 01/11/2024 03:32 PM EST RP Workstation: [id number]    {Document cardiac monitor, telemetry assessment procedure when appropriate:32947} Procedures   Medications Ordered in the ED - No data to display    {Click here for ABCD2, HEART and other calculators REFRESH Note before signing:1}                              Medical Decision  Making Amount and/or Complexity of Data Reviewed Labs: ordered.   ***   Testicular ultrasound completed shows extensive bilateral scrotal wall thickening, bilateral hydroceles, mildly complex fluid collection lateral to the left testicle with abscess not excluded.  {Document critical care time when appropriate  Document review of labs and clinical decision tools ie [id number], etc  Document your independent review of radiology images and any outside records  Document your discussion with family members, caretakers and with consultants  Document social determinants of health affecting pt's care  Document your decision making why or why not admission, treatments were needed:32947:::1}   Final diagnoses:  None    ED Discharge Orders     None

## 2024-01-11 NOTE — ED Provider Triage Note (Signed)
 Emergency Medicine Provider Triage Evaluation Note  [name] Dickerson , a 78 y.o. male  was evaluated in triage.  Pt complains of scrotal swelling.  Significant scrotal swelling and edema over the last 48 hours status postLeft lower extremity angiogram/left superficial femoral angioplasty and stenting/eft-to-right femoral-femoral bypas with Dr. Magda on 11 6.  No significant pain but swelling is uncomfortable.  Able to urinate without issue.  No pain in lower extremities or in the groin itself no fevers chills  Review of Systems  Positive: As above Negative: As above  Physical Exam  BP (!) 164/63 (BP Location: Right Arm)   Pulse 85   Temp 98.6 F (37 C) (Oral)   Resp 18   Ht 5' 8 (1.727 m)   Wt 83 kg   SpO2 98%   BMI 27.83 kg/m  Gen:   Awake, no distress   Resp:  Normal effort  MSK:   Moves extremities without difficulty .  2+ DP pulse on the right foot and biphasic Doppler DP pulse on the left foot.  Feet are well-perfused Other:  Significant scrotal edema and ecchymosis  Medical Decision Making  Medically screening exam initiated at 1:41 PM.  Appropriate orders placed.  [name] was informed that the remainder of the evaluation will be completed by another provider, this initial triage assessment does not replace that evaluation, and the importance of remaining in the ED until their evaluation is complete.     Pamella Ozell LABOR, DO 01/11/24 1343

## 2024-01-11 NOTE — ED Triage Notes (Signed)
 Pt to er via ems, per ems pt is here for groin pain and swelling, states that he had a bypass of the veins in his legs.  States that the nurse came to see him and his legs are fine, but he is having some swelling and pain in his groin.

## 2024-01-13 ENCOUNTER — Other Ambulatory Visit (HOSPITAL_COMMUNITY): Payer: Self-pay

## 2024-01-13 ENCOUNTER — Encounter: Payer: Self-pay | Admitting: Physician Assistant

## 2024-01-13 ENCOUNTER — Ambulatory Visit (HOSPITAL_COMMUNITY)
Admission: RE | Admit: 2024-01-13 | Discharge: 2024-01-13 | Disposition: A | Discharge status: Home / Self Care | Source: Ambulatory Visit | Attending: Vascular Surgery | Admitting: Vascular Surgery

## 2024-01-13 ENCOUNTER — Telehealth: Payer: Self-pay | Admitting: Pharmacist

## 2024-01-13 ENCOUNTER — Other Ambulatory Visit: Payer: Self-pay | Admitting: *Deleted

## 2024-01-13 ENCOUNTER — Ambulatory Visit: Attending: Vascular Surgery | Admitting: Physician Assistant

## 2024-01-13 VITALS — BP 151/78 | HR 75 | Temp 97.7°F | Wt 186.0 lb

## 2024-01-13 DIAGNOSIS — N5089 Other specified disorders of the male genital organs: Secondary | ICD-10-CM

## 2024-01-13 DIAGNOSIS — I82411 Acute embolism and thrombosis of right femoral vein: Secondary | ICD-10-CM

## 2024-01-13 DIAGNOSIS — M7989 Other specified soft tissue disorders: Secondary | ICD-10-CM | POA: Insufficient documentation

## 2024-01-13 DIAGNOSIS — I70213 Atherosclerosis of native arteries of extremities with intermittent claudication, bilateral legs: Secondary | ICD-10-CM

## 2024-01-13 MED ORDER — ELIQUIS DVT/PE STARTER PACK 5 MG PO TBPK
ORAL_TABLET | ORAL | 0 refills | Status: AC
  Filled 2024-01-13: qty 74, 30d supply, fill #0

## 2024-01-13 MED ORDER — APIXABAN 5 MG PO TABS: 5.0000 mg | ORAL_TABLET | Freq: Two times a day (BID) | ORAL | 0 refills | Status: AC

## 2024-01-13 NOTE — Telephone Encounter (Signed)
 Discussed the patient with Teretha Damme, PA-C, in clinic. He is s/p left to right femoral-femoral bypass with left SFA stenting on 01/06/24 by Dr. Magda. He presented to the ED on 01/11/24 with complaint of scrotal swelling and leg swelling. Vascular surgery was consulted and recommended the patient follow up outpatient for a DVT study and vascular follow up visit. Ultrasound completed today showed age indeterminate DVT involving the right common femoral vein and femoral vein. No prior history of DVT. Will start on Eliquis VTE starter pack dosing. Test claim shows Eliquis is $0 with his insurance as he is in catastrophic coverage. Starter pack filled at [facility] today. Refills sent to [facility] per patient preference. Counseled him to make sure he picks up the remainder of treatment (2 month supply) of Eliquis from [facility] on next fill as Eliquis will likely be much more expensive at the beginning of 2026 due to his deductible starting over. Plan to treat for 3 months for first provoked DVT. Counseled on the importance of taking aspirin  and Plavix  in addition to Eliquis. He has follow up with Dr. Magda in 1 month and will follow up in DVT Clinic in 3 months. Extensively counseled on Eliquis and all of his questions were answered.

## 2024-01-13 NOTE — Progress Notes (Signed)
 POST OPERATIVE OFFICE NOTE    CC:  F/u for surgery  HPI:  This is a 78 y.o. male who is s/p left to right femoral-femoral bypass with left SFA stenting on 01/06/24 by Dr. Magda.  He was having bilateral lifestyle limiting claudication with pre operative angiogram showing occluded right iliac artery and left SFA. He did well post operatively and was discharged home post op day 1.  On 11/11 he presented to [facility] ED with concerns of scrotal swelling that was reportedly increasing since surgery. Duplex showed hydroceles and scrotal edema. No concerns for infection on lab evaluation or clinical exam. Scrotal elevation and follow up with us  was re commended.  He is here today with non invasive studies. He reports continued scrotal swelling. He has some discomfort on ambulation but otherwise not really reporting any pain. His groin incisions are doing well. He denies any pain in his legs on ambulation or rest. No tissue loss. He does have a little swelling in right leg but he has been elevating it. He has not been taking his Aspirin  or Plavix  since discharge. He says he has had a lot of additions/ removal of medications and is uncertain really what he is suppose to be on at this time. He is scheduled to meet with pharmacist on 11/25 to go over all of his medications.   Allergies  Allergen Reactions   Statins Other (See Comments)    Soreness with Crestor    Current Outpatient Medications  Medication Sig Dispense Refill   ALPRAZolam  (XANAX ) 0.5 MG tablet Take 0.5 mg by mouth at bedtime.     amLODipine  (NORVASC ) 5 MG tablet Take 1 tablet (5 mg total) by mouth daily. (Patient not taking: Reported on 12/30/2023) 90 tablet 3   aspirin  EC 81 MG tablet Take 1 tablet (81 mg total) by mouth daily. Swallow whole. 360 tablet 0   B Complex-C (B-COMPLEX WITH VITAMIN C) tablet Take 1 tablet by mouth daily.     clopidogrel  (PLAVIX ) 75 MG tablet Take 1 tablet (75 mg total) by mouth daily with breakfast. (Patient  taking differently: Take 75 mg by mouth 2 (two) times a week.) 90 tablet 3   Evolocumab  (REPATHA  SURECLICK) 140 MG/ML SOAJ INJECT 140 MG INTO THE SKIN EVERY 14 (FOURTEEN) DAYS. 6 mL 3   FOLIC ACID PO Take 1 tablet by mouth daily.     furosemide  (LASIX ) 20 MG tablet Take 20 mg by mouth 3 (three) times a week. Tuesdays, Thursdays & Saturdays.     gabapentin (NEURONTIN) 300 MG capsule Take 300 mg by mouth at bedtime.     Glucosamine HCl (GLUCOSAMINE PO) Take 1 tablet by mouth daily.     levothyroxine  (SYNTHROID ) 175 MCG tablet Take 175 mcg by mouth daily before breakfast.     nitroGLYCERIN  (NITROSTAT ) 0.4 MG SL tablet Place 1 tablet (0.4 mg total) under the tongue every 5 (five) minutes as needed for chest pain. 25 tablet 2   oxyCODONE-acetaminophen  (PERCOCET/ROXICET) 5-325 MG tablet Take 1 tablet by mouth every 6 (six) hours as needed for moderate pain (pain score 4-6). 20 tablet 0   Semaglutide, 1 MG/DOSE, (OZEMPIC, 1 MG/DOSE,) 4 MG/3ML SOPN Inject 1 mg into the skin every Sunday.     tamsulosin (FLOMAX) 0.4 MG CAPS capsule Take 0.4 mg by mouth 3 (three) times a week.     No current facility-administered medications for this visit.     ROS:  See HPI  Physical Exam:  Vitals:   01/13/24  1331  BP: (!) 151/78  Pulse: 75  Temp: 97.7 F (36.5 C)     Incision:  Bilateral groin incisions are intact and healing well. Steri strips in place; he does have extensive suprapubic edema and edema extending into his scrotum with significant swelling Extremities:  BLE well perfused and warm Neuro: alert and oriented Abdomen:  soft  Non invasive Vascular lab: VAS US  Lower extremity venous (DVT) Right: Summary:  RIGHT:  - Findings consistent with age indeterminate deep vein thrombosis involving the right femoral vein, and right common femoral vein.  - No cystic structure found in the popliteal fossa.  -Groin vessels are not well visualized, questioning hematoma in right groin. History of new graft  placement on 01/06/2024  LEFT:  - No evidence of common femoral vein obstruction.  -Questioning fluid collection in left groin      Assessment/Plan:  This is a 78 y.o. male who is s/p: left to right femoral-femoral bypass with left SFA stenting on 01/06/24 by Dr. Magda.  He was having bilateral lifestyle limiting claudication with pre operative angiogram showing occluded right iliac artery and left SFA. He did well post operatively and was discharged home post op day 1. He is here with scrotal swelling post operatively. DVT exam was performed due to swelling of the scrotum and RLE showing age indeterminate thrombus in his femoral vein and common femoral vein. From a surgical standpoint his symptoms are improved and his incisions are intact and well appearing.  - Asked Izetta Henry, RPH to meet and talk with patient regarding his anticoagulation  - He will start Eliquis today for 3 months since this is his first DVT - He will be on triple therapy with Aspirin , Plavix  and Eliquis for 1 month. Will plan to d/c plavix  at time of his follow up in December - We will have him follow up in 3 months with the DVT clinic as well - He will keep his appointment with us  on 02/03/24 and 02/08/24 with non invasive studies  Curry Damme, [facility] [phone]   Clinic MD:  [place]

## 2024-01-19 ENCOUNTER — Telehealth: Payer: Self-pay

## 2024-01-19 NOTE — Telephone Encounter (Signed)
 Chandra, RN with [facility] called to report she made pt's [facility] evaluation on 01/17/24.   Verbal order given for skilled nursing care 1x/wk for 4 weeks starting 01/24/24.

## 2024-01-25 ENCOUNTER — Encounter: Payer: Self-pay | Admitting: Pharmacist Clinician (PhC)/ Clinical Pharmacy Specialist

## 2024-01-25 ENCOUNTER — Ambulatory Visit: Attending: Cardiology | Admitting: Pharmacist Clinician (PhC)/ Clinical Pharmacy Specialist

## 2024-01-25 VITALS — BP 122/68 | HR 72

## 2024-01-25 DIAGNOSIS — I739 Peripheral vascular disease, unspecified: Secondary | ICD-10-CM | POA: Diagnosis present

## 2024-01-25 DIAGNOSIS — E782 Mixed hyperlipidemia: Secondary | ICD-10-CM | POA: Diagnosis present

## 2024-01-25 DIAGNOSIS — I10 Essential (primary) hypertension: Secondary | ICD-10-CM | POA: Diagnosis present

## 2024-01-25 NOTE — Patient Instructions (Signed)
 Follow up appointment: with Dr. Krasowski in spring  Take your meds as follows:  RESTART ASPIRIN  81 MG ONCE DAILY UNTIL DR. KRASOWSKI TELLS YOU DIFFERENT  RESTART LISINOPRIL  30 MG ONCE DAILY  RESTART METOPROLOL  50 MG - BUT TAKE 1/2 TABLET TWICE DAILY  IF YOUR HOME BLOOD PRESSURE READINGS GO < 100/60, THEN STOP THE LISINOPRIL  UNTIL YOU COME BACK TO SEE DR. KRASOWSKI  Check your blood pressure at home 3-4 days each week and keep record of the readings.  Your blood pressure goal is < 130/80  To check your pressure at home you will need to:  1. Sit up in a chair, with feet flat on the floor and back supported. Do not cross your ankles or legs. 2. Rest your left arm so that the cuff is about heart level. If the cuff goes on your upper arm,  then just relax the arm on the table, arm of the chair or your lap. If you have a wrist cuff, we  suggest relaxing your wrist against your chest (think of it as Pledging the Flag with the  wrong arm).  3. Place the cuff snugly around your arm, about 1 inch above the crook of your elbow. The  cords should be inside the groove of your elbow.  4. Sit quietly, with the cuff in place, for about 5 minutes. After that 5 minutes press the power  button to start a reading. 5. Do not talk or move while the reading is taking place.  6. Record your readings on a sheet of paper. Although most cuffs have a memory, it is often  easier to see a pattern developing when the numbers are all in front of you.  7. You can repeat the reading after 1-3 minutes if it is recommended  Make sure your bladder is empty and you have not had caffeine or tobacco within the last 30 min  Always bring your blood pressure log with you to your appointments. If you have not brought your monitor in to be double checked for accuracy, please bring it to your next appointment.  You can find a list of quality blood pressure cuffs at [email or web address]  Important lifestyle changes to control high  blood pressure  Intervention  Effect on the BP  Lose extra pounds and watch your waistline Weight loss is one of the most effective lifestyle changes for controlling blood pressure. If you're overweight or obese, losing even a small amount of weight can help reduce blood pressure. Blood pressure might go down by about 1 millimeter of mercury (mm Hg) with each kilogram (about 2.2 pounds) of weight lost.  Exercise regularly As a general goal, aim for at least 30 minutes of moderate physical activity every day. Regular physical activity can lower high blood pressure by about 5 to 8 mm Hg.  Eat a healthy diet Eating a diet rich in whole grains, fruits, vegetables, and low-fat dairy products and low in saturated fat and cholesterol. A healthy diet can lower high blood pressure by up to 11 mm Hg.  Reduce salt (sodium) in your diet Even a small reduction of sodium in the diet can improve heart health and reduce high blood pressure by about 5 to 6 mm Hg.  Limit alcohol One drink equals 12 ounces of beer, 5 ounces of wine, or 1.5 ounces of 80-proof liquor.  Limiting alcohol to less than one drink a day for women or two drinks a day for men can help lower blood pressure  by about 4 mm Hg.   If you have any questions or concerns please use My Chart to send questions or call the office at [phone]

## 2024-01-25 NOTE — Assessment & Plan Note (Signed)
 Patient currently prescribed triple therapy after PAD stenting and DVT.  Not taking ASA at home, but states compliance with clopidogrel  and Eliquis .  Explained reasoning behind DAPT and stressed importance, especially in the first month after stenting.    States he will re-start aspirin  and stay on until told by MD to discontinue.

## 2024-01-25 NOTE — Assessment & Plan Note (Signed)
 Blood pressure in the office controlled today with metoprolol  and lisinopril .  Has amlodipine  5 mg tablets as well, but has not been taking.  Will have him switch the metoprolol  to 1/2 tablet twice daily, as he is currently taking metoprolol  tartrate 50 mg once daily.  Showed him how to snap the tablets in half and explained why better to take twice daily.    He will monitor BP at home for a few weeks and should his readings be too low, he can reach out and we will consider stopping one.  Should home readings be elevated, may need to consider re-starting amlodipine .

## 2024-01-25 NOTE — Progress Notes (Signed)
 Office Visit    Patient Name: [name] Dickerson Date of Encounter: 01/25/2024  Primary Care Provider:  Pia Kerney SQUIBB, MD Primary Cardiologist:  Lamar Fitch, MD  Chief Complaint    Hypertension, Medication management  Significant Past Medical History   HTN Restarted amlodipine  at last visit  CAD 1995 MI; 2003 STEMI, PTCA to RCA w/ 2 overlapping BMS to RCA; 2024 NSTEMI  DM2 On Ozempic  HLD   PAD 11/25 L to R fem-fem bypass w/L SFA stenting; ASA, clopidogrel , statin    Allergies  Allergen Reactions   Statins Other (See Comments)    Soreness with Crestor    History of Present Illness    [name] Dickerson is a 78 y.o. male patient of Dr Fitch, in the office today for medication management.  He was seen by Delon Hoover NP in October and at that time not compliant with all medications.  He has the added stress of caring for his wife who suffers from dementia.  Earlier this month he had L to R fem-fem bypass with left SFA stenting.  He was discharged the next day on clopidogrel  and aspirin .  Four days later he went to the ED for scrotal edema and two days later venous doppler was positive for DVT.  He was put on Eliquis , to take triple therapy for one month, then potentially stop clopidogrel .    Today he is in the office over concerns for medication compliance.  He brings all of his medications in a bag for us  to review.  States he is not taking aspirin , but is on the clopidogrel  and Eliquis .    Blood Pressure Goal:  130/80  Social Hx:      Tobacco: daily  Alcohol: no  Caffeine:  Blood pressure readings:  has not been checking at home recently.    Accessory Clinical Findings    Lab Results  Component Value Date   CREATININE 1.04 01/11/2024   BUN 10 01/11/2024   NA 136 01/11/2024   K 3.4 (L) 01/11/2024   CL 98 01/11/2024   CO2 24 01/11/2024   Lab Results  Component Value Date   ALT 18 01/11/2024   AST 23 01/11/2024   ALKPHOS 73 01/11/2024   BILITOT 0.5 01/11/2024    Lab Results  Component Value Date   HGBA1C 5.6 01/03/2024    Home Medications    Current Outpatient Medications  Medication Sig Dispense Refill   ALPRAZolam  (XANAX ) 0.5 MG tablet Take 0.5 mg by mouth at bedtime.     apixaban  (ELIQUIS ) 5 MG TABS tablet Take 1 tablet (5 mg total) by mouth 2 (two) times daily. Start taking after completion of starter pack. 120 tablet 0   Apixaban  Starter Pack, 10mg  and 5mg , (ELIQUIS  DVT/PE STARTER PACK) Take as directed on package: start with two-5mg  tablets twice daily for 7 days. On day 8, switch to one-5mg  tablet twice daily. 74 each 0   aspirin  EC 81 MG tablet Take 1 tablet (81 mg total) by mouth daily. Swallow whole. 360 tablet 0   B Complex-C (B-COMPLEX WITH VITAMIN C) tablet Take 1 tablet by mouth daily.     clopidogrel  (PLAVIX ) 75 MG tablet Take 1 tablet (75 mg total) by mouth daily with breakfast. 90 tablet 3   Evolocumab  (REPATHA  SURECLICK) 140 MG/ML SOAJ INJECT 140 MG INTO THE SKIN EVERY 14 (FOURTEEN) DAYS. 6 mL 3   folic acid  (FOLVITE ) 400 MCG tablet Take 400 mcg by mouth daily.  furosemide  (LASIX ) 20 MG tablet Take 20 mg by mouth 3 (three) times a week. Tuesdays, Thursdays & Saturdays.     gabapentin  (NEURONTIN ) 300 MG capsule Take 300 mg by mouth at bedtime.     Glucosamine HCl (GLUCOSAMINE PO) Take 1 tablet by mouth daily.     IRON CR PO Take 36 mg by mouth every other day.     levothyroxine  (SYNTHROID ) 175 MCG tablet Take 175 mcg by mouth daily before breakfast.     meloxicam (MOBIC) 15 MG tablet Take 15 mg by mouth daily.     nitroGLYCERIN  (NITROSTAT ) 0.4 MG SL tablet Place 1 tablet (0.4 mg total) under the tongue every 5 (five) minutes as needed for chest pain. 25 tablet 2   oxyCODONE -acetaminophen  (PERCOCET/ROXICET) 5-325 MG tablet Take 1 tablet by mouth every 6 (six) hours as needed for moderate pain (pain score 4-6). 20 tablet 0   Semaglutide, 1 MG/DOSE, (OZEMPIC, 1 MG/DOSE,) 4 MG/3ML SOPN Inject 1 mg into the skin every Sunday.      tamsulosin  (FLOMAX ) 0.4 MG CAPS capsule Take 0.4 mg by mouth 3 (three) times a week.     amLODipine  (NORVASC ) 5 MG tablet Take 1 tablet (5 mg total) by mouth daily. (Patient not taking: Reported on 12/30/2023) 90 tablet 3   No current facility-administered medications for this visit.         Assessment & Plan    PAD (peripheral artery disease) Patient currently prescribed triple therapy after PAD stenting and DVT.  Not taking ASA at home, but states compliance with clopidogrel  and Eliquis .  Explained reasoning behind DAPT and stressed importance, especially in the first month after stenting.    States he will re-start aspirin  and stay on until told by MD to discontinue.    Hyperlipidemia, unspecified Currently taking Repatha  with most recent LDL at 23 (total cholesterol 64).  Has an older bottle of ezetimibe  10 mg, has not taken for awhile.  Assured him that he won't need that and can discard.    Essential hypertension Blood pressure in the office controlled today with metoprolol  and lisinopril .  Has amlodipine  5 mg tablets as well, but has not been taking.  Will have him switch the metoprolol  to 1/2 tablet twice daily, as he is currently taking metoprolol  tartrate 50 mg once daily.  Showed him how to snap the tablets in half and explained why better to take twice daily.    He will monitor BP at home for a few weeks and should his readings be too low, he can reach out and we will consider stopping one.  Should home readings be elevated, may need to consider re-starting amlodipine .     Allean Mink PharmD CPP [facility]  [address] [place], [place] [phone]

## 2024-01-25 NOTE — Assessment & Plan Note (Signed)
 Currently taking Repatha  with most recent LDL at 23 (total cholesterol 64).  Has an older bottle of ezetimibe  10 mg, has not taken for awhile.  Assured him that he won't need that and can discard.

## 2024-02-03 ENCOUNTER — Ambulatory Visit (HOSPITAL_COMMUNITY)
Admission: RE | Admit: 2024-02-03 | Discharge: 2024-02-03 | Disposition: A | Source: Home / Self Care | Attending: Vascular Surgery | Admitting: Vascular Surgery

## 2024-02-03 ENCOUNTER — Ambulatory Visit (HOSPITAL_COMMUNITY)
Admission: RE | Admit: 2024-02-03 | Discharge: 2024-02-03 | Disposition: A | Attending: Vascular Surgery | Admitting: Vascular Surgery

## 2024-02-03 DIAGNOSIS — I70213 Atherosclerosis of native arteries of extremities with intermittent claudication, bilateral legs: Secondary | ICD-10-CM | POA: Insufficient documentation

## 2024-02-03 DIAGNOSIS — I739 Peripheral vascular disease, unspecified: Secondary | ICD-10-CM | POA: Insufficient documentation

## 2024-02-03 LAB — VAS US ABI WITH/WO TBI
Left ABI: 0.93
Right ABI: 0.93

## 2024-02-08 ENCOUNTER — Ambulatory Visit: Attending: Vascular Surgery | Admitting: Vascular Surgery

## 2024-02-08 ENCOUNTER — Encounter: Payer: Self-pay | Admitting: Vascular Surgery

## 2024-02-08 VITALS — BP 104/70 | HR 73 | Temp 98.0°F | Ht 68.0 in | Wt 182.1 lb

## 2024-02-08 DIAGNOSIS — I739 Peripheral vascular disease, unspecified: Secondary | ICD-10-CM

## 2024-02-08 NOTE — Progress Notes (Signed)
 [facility] [place]  ASSESSMENT / PLAN: [name] Dickerson is a 78 y.o. male status post left to right femoral-femoral bypass with left lower extremity superficial femoral artery stenting on 01/06/2024 for claudication.  Recommend:  Abstinence from all tobacco products. Blood glucose control with goal A1c < 7%. Blood pressure control with goal blood pressure < 140/90 mmHg. Lipid reduction therapy with goal LDL-C <100 mg/dL Aspirin  81mg  PO QD.  Atorvastatin  40-80mg  PO QD (or other high intensity statin therapy).  Doing very well.  I will see him again in 3 to 6 months with repeat noninvasive testing.  CHIEF COMPLAINT: cramping pain with walking  HISTORY OF PRESENT ILLNESS: [name] Dickerson is a 78 y.o. male referred to clinic for evaluation of cramping discomfort with walking.  The patient also describes back pain which radiates down the legs and is worse with standing.  He has fairly typical symptoms of both neurogenic and vascular claudication.  He does not have rest pain symptoms.  He has no ulcers about his feet.  He is mostly bothered about the discomfort in his lower back.  He reports massage therapies have been very helpful for him, but massage therapist has been reluctant to do massage for him because of his recent diagnosis of peripheral arterial disease.  We reviewed the natural history of peripheral arterial disease.  I explained the benign nature of the diagnosis of claudication.  I explained the rationale for smoking cessation and walk therapy.  05/25/2023: Patient returns to clinic for follow-up.  He reports persistent discomfort in his hips bilaterally.  He does report some cramping discomfort in this lower extremity musculature, especially when moving quickly or moving uphill.  This is not limiting him in his ADLs or IADLs.  He has no rest pain.  He has no ulcers about his feet.  He is not interested in intervention at this time.  11/23/23: Patient presents to  clinic for evaluation.  He reports his symptoms have worsened.  He is bothered more and more by his claudication.  He does not describe any rest pain.  He does not have any ulcers about his feet.  We reviewed the risk, benefits, and alternatives to intervention for intermittent claudication including increased risk of amputation after intervention.  He is understanding and wants to proceed.  02/08/24: Patient returns to clinic for postop evaluation.  He is status post left to right femoral-femoral bypass grafting and left superficial femoral artery angioplasty and stenting.  He had a good technical result from his surgery.  He did have some edema and scrotal swelling which is bothersome to him and for which she did seek care.  This is thankfully resolved.  He is claudication symptoms have resolved.  He is very thankful.   Past Medical History:  Diagnosis Date   Arthritis 10/24/2018   Formatting of this note might be different from the original.  Back  Formatting of this note might be different from the original.  Back     Atherosclerotic heart disease of native coronary artery without angina pectoris 11/23/2022   CAD S/P percutaneous coronary angioplasty 1995, 2003   Inferior STEMI-PTCA RCA; 2003: Overlapping MultiLink Zeta BMS -RCA (4.0 x 23, 4.0 x 15); 09/2001 Cutting Balloon PTCA of stents for ISR.;; Myoview  August 2013 - Mod-severe sized fixed INFERIOR perfusion defect => likely infarct with mild peri-infarct ischemia in the apical inferior wall, EF 55% with distal inferior hypokinesis   Carotid artery stenosis    Cholecystitis, chronic  03/30/2016   Cigarette smoker 10/24/2018   Coronary artery disease, occlusive 04/08/2011   Inferior STEMI-PTCA RCA; 2003: overlapping MultiLink Zeta BMS -RCA (4.0 mm x 23 mm, 4.0 mm x 15 mm); August 2003 Cutting Balloon PTCA of stents for ISR.;; Negative Myoview  August 2013  Formatting of this note might be different from the original.  Overview:   Inferior  STEMI-PTCA RCA; 2003: overlapping MultiLink Zeta BMS -RCA (4.0 mm x 23 mm, 4.0 mm x 15 mm); August 2003 Cutting Balloon PTCA of ste   Disease of thyroid  gland 10/24/2018   Essential hypertension    Foot pain, bilateral 09/14/2023   Generalized anxiety disorder 12/13/2023   Hip pain 07/04/2021   Hyperglycemia due to type 2 diabetes mellitus (HCC) 12/13/2023   Hyperlipidemia LDL goal <70    Hyperlipidemia, unspecified 03/04/2016   Statin intolerance. Monitored by PCP.  Formatting of this note might be different from the original.  Overview:   Statin intolerance. Monitored by PCP.     Last Assessment & Plan:   Myalgias, fatigue and laziness wall taking statins. He is on niacin.  He is due to get labs checked by PCP. Provided they're stable with lipid levels at goal range, continue without more aggressive treatment. If not, I   Hypothyroidism    Idiopathic osteoarthritis 12/13/2023   Intermittent claudication due to atherosclerosis of artery of extremity 09/14/2023   Light cigarette smoker (1-9 cigarettes per day) 10/24/2018   Was down to 5 cigarettes a day.  Formatting of this note might be different from the original.  Was down to 5 cigarettes a day.     Last Assessment & Plan:   Pretty much stable at 5 cigarettes a day.  I told him about the need to finally quit but he is not interested.  Would like for him to be done smoking altogether.     Low testosterone     Lower urinary tract symptoms due to benign prostatic hyperplasia 12/13/2023   Lumbar radiculopathy 10/29/2022   Mild cognitive disorder 12/13/2023   Moderate cigarette smoker (10-19 per day)    Was down to 5 cigarettes a day.   Myocardial infarction (HCC) 03/02/1993   PTCA of RCA  Formatting of this note might be different from the original.  Overview:   PTCA of RCA     Last Assessment & Plan:   His initial MI was related to his RCA, and basically all his cardiac history revolve around his RCA. Now noted to be occluded with scar noted on  Myoview  and echocardiogram. Does still have a preserved EF with no further anginal symptoms or heart failure symptoms.  Bettejane   Nicotine  dependence 12/13/2023   NSTEMI (non-ST elevated myocardial infarction) (HCC) 09/16/2022   Obesity (BMI 30-39.9) 03/12/2013   Formatting of this note might be different from the original.  Last Assessment & Plan:   He has now finally been able to lose a little bit of weight. He is trying to adjust his diet and increase his exercise.  He is now below the obesity threshold.  Formatting of this note might be different from the original.  Last Assessment & Plan:   Stable weight now puts him below the obesity category.     PAD (peripheral artery disease)    Parotid mass 10/23/2018   Polyneuropathy 12/13/2023   Preop cardiovascular exam 04/07/2017   Formatting of this note might be different from the original.  Last Assessment & Plan:   Jonpaul is doing very well with  no active angina or heart failure symptoms.  He has never had any TIA or stroke symptoms.  Has normal renal function and is nondiabetic.  He has planned wrist surgeries which are both low risk.     Based on ACC-AHA guidelines, he would be considered a low risk patient for low risk   Right upper quadrant pain 03/04/2016   Sacroiliac joint pain 10/29/2022   ST elevation myocardial infarction (STEMI) of inferior wall, subsequent episode of care (HCC) 03/02/1993   PTCA of RCA; large inferior scar noted on Myoview . Mid inferior and inferoseptal hypokinesis on echocardiogram November 2014. EF 50-55%.   Stenosis of coronary stent 03/02/2010   Mid RCA Occlusion with Right-To-Left and Left-To-Right Collaterals.; Relatively normal LAD, ramus intermedius and circumflex-none.   Stented coronary artery 10/24/2018   Type 2 diabetes mellitus with peripheral artery disease (HCC) 09/14/2023   Type 2 diabetes mellitus without complication, without long-term current use of insulin  (HCC) 01/11/2019    Past Surgical History:   Procedure Laterality Date   ABDOMINAL AORTOGRAM W/LOWER EXTREMITY N/A 12/17/2023   Procedure: ABDOMINAL AORTOGRAM W/LOWER EXTREMITY;  Surgeon: Magda Debby SAILOR, MD;  Location: [facility];  Service: Cardiovascular;  Laterality: N/A;   BACK SURGERY     Lumbar   CARDIAC CATHETERIZATION  05/01/2010   Mid occlusion of RCA with Right to Left and Left to Right collaterals; LAD, ramus and circumflex/OM widely patent   CHOLECYSTECTOMY     CORONARY ANGIOPLASTY  03/02/1993   Inferior STEMI-PTCA of RCA   CORONARY STENT PLACEMENT  03/02/2001   MultiLink Zeta BMS 4.0 mm x 23 mm, 4.0 mm x 15 mm --> Cutting Balloon PTCA for ISR several months later.   CYST REMOVAL NECK     EYE SURGERY     Lasik- Right, Bilateral Cataracts   FEMORAL-FEMORAL BYPASS GRAFT Bilateral 01/06/2024   Procedure: CREATION, BYPASS, ARTERIAL, FEMORAL TO FEMORAL, USING GRAFT;  Surgeon: Magda Debby SAILOR, MD;  Location: [facility] OR;  Service: Vascular;  Laterality: Bilateral;  WITH ANTEGRADE STENT OF SFA   LEFT HEART CATH AND CORONARY ANGIOGRAPHY N/A 09/17/2022   Procedure: LEFT HEART CATH AND CORONARY ANGIOGRAPHY;  Surgeon: Wendel Lurena POUR, MD;  Location: [facility];  Service: Cardiovascular;  Laterality: N/A;   LexiScan  Myoview   10/01/2011   Moderate-severe sized INFERIOR perfusion defect with no evidence of ischemia. EF 55% with distal inferior HK. Large sized Inferior and Inferolateral Infarct with perhaps mild apical inferior ischemia.   LOWER EXTREMITY ANGIOGRAM Left 01/06/2024   Procedure: GERALYN, LOWER EXTREMITY Left;  Surgeon: Magda Debby SAILOR, MD;  Location: [facility] OR;  Service: Vascular;  Laterality: Left;   LOWER EXTREMITY ANGIOGRAPHY N/A 12/17/2023   Procedure: Lower Extremity Angiography;  Surgeon: Magda Debby SAILOR, MD;  Location: [facility] INVASIVE CV LAB;  Service: Cardiovascular;  Laterality: N/A;   PENILE PROSTHESIS IMPLANT  2017   Three piece penile implant prosthesis   THORACIC AORTIC ENDOVASCULAR STENT GRAFT   01/06/2024   Procedure: INSERTION, ENDOVASCULAR STENT GRAFT, Left Leg SMA;  Surgeon: Magda Debby SAILOR, MD;  Location: [facility] OR;  Service: Vascular;;   TONSILLECTOMY     TRANSTHORACIC ECHOCARDIOGRAM  01/09/2013   EF 50-55%; normal LV size and function. Probable mild HJ of the mid inferior and inferoseptal wall.; No valvular lesions.    Family History  Problem Relation Age of Onset   Prostatitis Father    Lung cancer Father     Social History   Socioeconomic History   Marital status:  Married    Spouse name: Not on file   Number of children: Not on file   Years of education: Not on file   Highest education level: Not on file  Occupational History   Not on file  Tobacco Use   Smoking status: Every Day    Current packs/day: 1.00    Average packs/day: 1 pack/day for 40.0 years (40.0 ttl pk-yrs)    Types: Cigarettes, Pipe   Smokeless tobacco: Never  Vaping Use   Vaping status: Never Used  Substance and Sexual Activity   Alcohol use: No   Drug use: No   Sexual activity: Not on file  Other Topics Concern   Not on file  Social History Narrative   Married father of 3, grandfather 5. Works out occasionally on the treadmill. Coping to try get back into some exercise now. Unfortunately back up to smoking one pack per day. Does not drink alcohol.      Date from from 5 or 6 cigarettes a day. He's tried electronic cigarettes and down without affect.   Social Drivers of Health   Financial Resource Strain: Medium Risk (10/05/2022)   Received from [facility]   Overall Financial Resource Strain (CARDIA)    Difficulty of Paying Living Expenses: Somewhat hard  Food Insecurity: Patient Declined (01/07/2024)   Hunger Vital Sign    Worried About Programme Researcher, Broadcasting/film/video in the Last Year: Patient declined    Ran Out of Food in the Last Year: Patient declined  Transportation Needs: Patient Declined (01/07/2024)   PRAPARE - Administrator, Civil Service (Medical): Patient declined    Lack  of Transportation (Non-Medical): Patient declined  Physical Activity: Not on file  Stress: Not on file  Social Connections: Unknown (01/07/2024)   Social Connection and Isolation Panel    Frequency of Communication with Friends and Family: Patient declined    Frequency of Social Gatherings with Friends and Family: Not on file    Attends Religious Services: Patient declined    Active Member of Clubs or Organizations: Patient declined    Attends Banker Meetings: Patient declined    Marital Status: Patient declined  Intimate Partner Violence: Patient Declined (01/07/2024)   Humiliation, Afraid, Rape, and Kick questionnaire    Fear of Current or Ex-Partner: Patient declined    Emotionally Abused: Patient declined    Physically Abused: Patient declined    Sexually Abused: Patient declined    Allergies  Allergen Reactions   Statins Other (See Comments)    Soreness with Crestor    Current Outpatient Medications  Medication Sig Dispense Refill   ALPRAZolam  (XANAX ) 0.5 MG tablet Take 0.5 mg by mouth at bedtime.     apixaban  (ELIQUIS ) 5 MG TABS tablet Take 1 tablet (5 mg total) by mouth 2 (two) times daily. Start taking after completion of starter pack. 120 tablet 0   Apixaban  Starter Pack, 10mg  and 5mg , (ELIQUIS  DVT/PE STARTER PACK) Take as directed on package: start with two-5mg  tablets twice daily for 7 days. On day 8, switch to one-5mg  tablet twice daily. 74 each 0   aspirin  EC 81 MG tablet Take 1 tablet (81 mg total) by mouth daily. Swallow whole. 360 tablet 0   B Complex-C (B-COMPLEX WITH VITAMIN C) tablet Take 1 tablet by mouth daily.     clopidogrel  (PLAVIX ) 75 MG tablet Take 1 tablet (75 mg total) by mouth daily with breakfast. 90 tablet 3   Evolocumab  (REPATHA  SURECLICK) 140 MG/ML SOAJ  INJECT 140 MG INTO THE SKIN EVERY 14 (FOURTEEN) DAYS. 6 mL 3   folic acid  (FOLVITE ) 400 MCG tablet Take 400 mcg by mouth daily.     furosemide  (LASIX ) 20 MG tablet Take 20 mg by mouth 3  (three) times a week. Tuesdays, Thursdays & Saturdays.     gabapentin  (NEURONTIN ) 300 MG capsule Take 300 mg by mouth at bedtime.     Glucosamine HCl (GLUCOSAMINE PO) Take 1 tablet by mouth daily.     IRON CR PO Take 36 mg by mouth every other day.     levothyroxine  (SYNTHROID ) 175 MCG tablet Take 175 mcg by mouth daily before breakfast.     meloxicam (MOBIC) 15 MG tablet Take 15 mg by mouth daily.     nitroGLYCERIN  (NITROSTAT ) 0.4 MG SL tablet Place 1 tablet (0.4 mg total) under the tongue every 5 (five) minutes as needed for chest pain. 25 tablet 2   oxyCODONE -acetaminophen  (PERCOCET/ROXICET) 5-325 MG tablet Take 1 tablet by mouth every 6 (six) hours as needed for moderate pain (pain score 4-6). 20 tablet 0   Semaglutide, 1 MG/DOSE, (OZEMPIC, 1 MG/DOSE,) 4 MG/3ML SOPN Inject 1 mg into the skin every Sunday.     tamsulosin  (FLOMAX ) 0.4 MG CAPS capsule Take 0.4 mg by mouth 3 (three) times a week.     No current facility-administered medications for this visit.    PHYSICAL EXAM Vitals:   02/08/24 1322  BP: 104/70  Pulse: 73  Temp: 98 F (36.7 C)  SpO2: 98%  Weight: 182 lb 1.6 oz (82.6 kg)  Height: 5' 8 (1.727 m)     Elderly man in no distress Regular rate and rhythm Unlabored breathing Bilateral femoral pulses are palpable No palpable pedal pulses No ulcers about the feet   PERTINENT LABORATORY AND RADIOLOGIC DATA  Most recent CBC    Latest Ref Rng & Units 01/11/2024    8:02 PM 01/07/2024    2:46 AM 01/06/2024    2:00 PM  CBC  WBC 4.0 - 10.5 K/uL 5.7  7.0  4.9   Hemoglobin 13.0 - 17.0 g/dL 89.5  9.6  89.5   Hematocrit 39.0 - 52.0 % 30.5  28.8  31.2   Platelets 150 - 400 K/uL 180  124  123      Most recent CMP    Latest Ref Rng & Units 01/11/2024    8:02 PM 01/07/2024    2:46 AM 01/06/2024    2:00 PM  CMP  Glucose 70 - 99 mg/dL 871  879    BUN 8 - 23 mg/dL 10  8    Creatinine 9.38 - 1.24 mg/dL 8.95  8.90  9.04   Sodium 135 - 145 mmol/L 136  135    Potassium 3.5  - 5.1 mmol/L 3.4  4.0    Chloride 98 - 111 mmol/L 98  102    CO2 22 - 32 mmol/L 24  24    Calcium  8.9 - 10.3 mg/dL 8.9  7.6    Total Protein 6.5 - 8.1 g/dL 6.7     Total Bilirubin 0.0 - 1.2 mg/dL 0.5     Alkaline Phos 38 - 126 U/L 73     AST 15 - 41 U/L 23     ALT 0 - 44 U/L 18       Renal function CrCl cannot be calculated (Patient's most recent lab result is older than the maximum 21 days allowed.).  Hgb A1c MFr Bld (%)  Date Value  01/03/2024 5.6    LDL Chol Calc (NIH)  Date Value Ref Range Status  05/27/2022 96 0 - 99 mg/dL Final   LDL Cholesterol  Date Value Ref Range Status  01/07/2024 23 0 - 99 mg/dL Final    Comment:           Total Cholesterol/HDL:CHD Risk Coronary Heart Disease Risk Table                     Men   Women  1/2 Average Risk   3.4   3.3  Average Risk       5.0   4.4  2 X Average Risk   9.6   7.1  3 X Average Risk  23.4   11.0        Use the calculated Patient Ratio above and the CHD Risk Table to determine the patient's CHD Risk.        ATP III CLASSIFICATION (LDL):  <100     mg/dL   Optimal  899-870  mg/dL   Near or Above                    Optimal  130-159  mg/dL   Borderline  839-810  mg/dL   High  >809     mg/dL   Very High Performed at [facility], [address], [place], [place] [address]    LDL Direct  Date Value Ref Range Status  08/24/2023 37 0 - 99 mg/dL Final      +-------+-----------+-----------+------------+------------+  ABI/TBIToday's ABIToday's TBIPrevious ABIPrevious TBI  +-------+-----------+-----------+------------+------------+  Right 0.93       0.63       0.55        0.38          +-------+-----------+-----------+------------+------------+  Left  0.93       0.57       0.70        0.59          +-------+-----------+-----------+------------+------------+   Femorofemoral bypass duplex: Patent left to right femoral to femoral bypass graft. 50-74%  stenosis is visualized in the inflow  artery. There is no evidence of  stenosis within the graft.   Bilateral lower extremity arterial duplex Right: Retrograde flow is seen in the common femoral artery. Largely  triphasic flow is visualized throughout the right lower extremity. Patent  three vessel run-off to the foot. Fluid collection vs hematoma seen in the  groin.   Left: 30-49% stenosis is seen in the common femoral artery. Largely  biphasic flow is seen throughout the lower extremity. Patent proximal  superficial femoral artery to proximal popliteal artery stent is seen.  There is no evidence of in-stent stenosis.  Patent three vessel run-off to the foot. Fluid collection vs hematoma seen  in the groin.   Debby SAILOR. Magda, MD FACS [facility] Phone Number: [phone] 02/08/2024 4:56 PM   Total time spent on preparing this encounter including chart review, data review, collecting history, examining the patient, coordinating care for this established patient, 40 minutes  Portions of this report may have been transcribed using voice recognition software.  Every effort has been made to ensure accuracy; however, inadvertent computerized transcription errors may still be present.

## 2024-02-09 ENCOUNTER — Other Ambulatory Visit: Payer: Self-pay

## 2024-02-09 ENCOUNTER — Encounter: Payer: Self-pay | Admitting: Vascular Surgery

## 2024-02-09 DIAGNOSIS — I70213 Atherosclerosis of native arteries of extremities with intermittent claudication, bilateral legs: Secondary | ICD-10-CM

## 2024-04-11 ENCOUNTER — Ambulatory Visit: Admitting: Pharmacist

## 2024-08-15 ENCOUNTER — Ambulatory Visit: Admitting: Vascular Surgery

## 2024-08-15 ENCOUNTER — Ambulatory Visit (HOSPITAL_COMMUNITY)
